# Patient Record
Sex: Male | Born: 1950 | Race: White | Hispanic: No | Marital: Married | Smoking: Never smoker
Health system: Southern US, Community
[De-identification: ages and names within clinical notes are randomized; demographics above are authoritative.]

## PROBLEM LIST (undated history)

## (undated) DIAGNOSIS — N529 Male erectile dysfunction, unspecified: Secondary | ICD-10-CM

## (undated) DIAGNOSIS — I2510 Atherosclerotic heart disease of native coronary artery without angina pectoris: Secondary | ICD-10-CM

## (undated) DIAGNOSIS — Z8669 Personal history of other diseases of the nervous system and sense organs: Secondary | ICD-10-CM

## (undated) DIAGNOSIS — I700 Atherosclerosis of aorta: Secondary | ICD-10-CM

## (undated) DIAGNOSIS — E7800 Pure hypercholesterolemia, unspecified: Secondary | ICD-10-CM

## (undated) DIAGNOSIS — Z7901 Long term (current) use of anticoagulants: Secondary | ICD-10-CM

## (undated) DIAGNOSIS — Z8719 Personal history of other diseases of the digestive system: Secondary | ICD-10-CM

## (undated) DIAGNOSIS — I10 Essential (primary) hypertension: Secondary | ICD-10-CM

## (undated) DIAGNOSIS — I739 Peripheral vascular disease, unspecified: Secondary | ICD-10-CM

## (undated) DIAGNOSIS — K219 Gastro-esophageal reflux disease without esophagitis: Secondary | ICD-10-CM

## (undated) DIAGNOSIS — N400 Enlarged prostate without lower urinary tract symptoms: Secondary | ICD-10-CM

## (undated) DIAGNOSIS — K5790 Diverticulosis of intestine, part unspecified, without perforation or abscess without bleeding: Secondary | ICD-10-CM

## (undated) DIAGNOSIS — E785 Hyperlipidemia, unspecified: Secondary | ICD-10-CM

## (undated) DIAGNOSIS — F419 Anxiety disorder, unspecified: Secondary | ICD-10-CM

## (undated) HISTORY — PX: HERNIA REPAIR: SHX51

## (undated) HISTORY — PX: HEEL SPUR SURGERY: SHX665

## (undated) HISTORY — PX: VASECTOMY: SHX75

---

## 2009-12-25 ENCOUNTER — Emergency Department: Payer: Self-pay | Admitting: Unknown Physician Specialty

## 2011-07-19 ENCOUNTER — Emergency Department: Payer: Self-pay | Admitting: Emergency Medicine

## 2011-07-26 ENCOUNTER — Emergency Department: Payer: Self-pay | Admitting: *Deleted

## 2012-04-23 ENCOUNTER — Encounter: Payer: Self-pay | Admitting: Sports Medicine

## 2012-04-28 ENCOUNTER — Encounter: Payer: Self-pay | Admitting: Sports Medicine

## 2012-05-29 ENCOUNTER — Encounter: Payer: Self-pay | Admitting: Sports Medicine

## 2012-06-02 ENCOUNTER — Ambulatory Visit: Payer: Self-pay | Admitting: Gastroenterology

## 2013-10-05 ENCOUNTER — Ambulatory Visit: Payer: Self-pay | Admitting: Family Medicine

## 2014-09-22 DIAGNOSIS — R001 Bradycardia, unspecified: Secondary | ICD-10-CM | POA: Insufficient documentation

## 2015-03-18 ENCOUNTER — Encounter: Payer: Self-pay | Admitting: Emergency Medicine

## 2015-03-18 ENCOUNTER — Emergency Department
Admission: EM | Admit: 2015-03-18 | Discharge: 2015-03-18 | Disposition: A | Discharge status: Home / Self Care | Payer: 59 | Attending: Emergency Medicine | Admitting: Emergency Medicine

## 2015-03-18 DIAGNOSIS — T63441A Toxic effect of venom of bees, accidental (unintentional), initial encounter: Secondary | ICD-10-CM | POA: Diagnosis present

## 2015-03-18 DIAGNOSIS — Y9289 Other specified places as the place of occurrence of the external cause: Secondary | ICD-10-CM | POA: Diagnosis not present

## 2015-03-18 DIAGNOSIS — Y998 Other external cause status: Secondary | ICD-10-CM | POA: Insufficient documentation

## 2015-03-18 DIAGNOSIS — Y9389 Activity, other specified: Secondary | ICD-10-CM | POA: Diagnosis not present

## 2015-03-18 MED ORDER — PREDNISONE 50 MG PO TABS: 50.0000 mg | ORAL_TABLET | Freq: Every day | ORAL | Status: AC

## 2015-03-18 NOTE — ED Notes (Signed)
Patient with no complaints at this time. Respirations even and unlabored. Skin warm/dry. Discharge instructions reviewed with patient at this time. Patient given opportunity to voice concerns/ask questions. Patient discharged at this time and left Emergency Department with steady gait.   

## 2015-03-18 NOTE — ED Notes (Signed)
Pt reports he was stung on his right hand this afternoon by a bee. Hand now swollen and painful.

## 2015-03-18 NOTE — Discharge Instructions (Signed)

## 2015-03-19 NOTE — ED Provider Notes (Signed)
[facility] Emergency Department Provider Note  ____________________________________________  Time seen: On arrival  I have reviewed the triage vital signs and the nursing notes.   HISTORY  Chief Complaint Insect Bite    HPI Cory Hoover is a 64 y.o. male who presents after a bee sting to the right hand. He reports this occurred approximately 3 PM this afternoon and he developed some swelling of the hand afterwards. He has never had a reaction to a bee sting before. He notes in the last hour it has not worsened. No oral symptoms, no shortness of breath    History reviewed. No pertinent past medical history.  There are no active problems to display for this patient.   History reviewed. No pertinent past surgical history.  Current Outpatient Rx  Name  Route  Sig  Dispense  Refill  . predniSONE (DELTASONE) 50 MG tablet   Oral   Take 1 tablet (50 mg total) by mouth daily. As needed   3 tablet   0     Allergies Review of patient's allergies indicates no known allergies.  No family history on file.  Social History Social History  Substance Use Topics  . Smoking status: Unknown If Ever Smoked  . Smokeless tobacco: None  . Alcohol Use: No    Review of Systems  Constitutional: Negative for fever. Eyes: Negative for visual changes. ENT: Negative for sore throat or swelling   . Musculoskeletal: Negative for back pain. Skin: Negative for rash. Neurological: Negative for headaches    ____________________________________________   PHYSICAL EXAM:  VITAL SIGNS: ED Triage Vitals  Enc Vitals Group     BP 03/18/15 2017 154/86 mmHg     Pulse Rate 03/18/15 2017 63     Resp 03/18/15 2017 18     Temp 03/18/15 2017 98 F (36.7 C)     Temp Source 03/18/15 2017 Oral     SpO2 03/18/15 2017 100 %     Weight 03/18/15 2017 160 lb (72.576 kg)     Height 03/18/15 2017 5\' 6"  (1.676 m)     Head Cir --      Peak Flow --      Pain Score  03/18/15 2018 2     Pain Loc --      Pain Edu? --      Excl. in [place]? --      Constitutional: Alert and oriented. Well appearing and in no distress. Eyes: Conjunctivae are normal.  ENT   Head: Normocephalic and atraumatic.   Mouth/Throat: Mucous membranes are moist. Cardiovascular: Normal rate, regular rhythm.  Respiratory: Normal respiratory effort without tachypnea nor retractions.  Gastrointestinal: Soft and non-tender in all quadrants. No distention. There is no CVA tenderness. Musculoskeletal: Nontender with normal range of motion in all extremities. Neurologic:  Normal speech and language. No gross focal neurologic deficits are appreciated. Skin:  Skin is warm, dry and intact. Patient with mild swelling to the right hand and some slight erythema. It does not spread to the arm. No hives Psychiatric: Mood and affect are normal. Patient exhibits appropriate insight and judgment.  ____________________________________________    LABS (pertinent positives/negatives)  Labs Reviewed - No data to display  ____________________________________________     ____________________________________________    RADIOLOGY I have personally reviewed any xrays that were ordered on this patient: None  ____________________________________________   PROCEDURES  Procedure(s) performed: none   ____________________________________________   INITIAL IMPRESSION / ASSESSMENT AND PLAN / ED COURSE  Pertinent labs &  imaging results that were available during my care of the patient were reviewed by me and considered in my medical decision making (see chart for details).  Exam consistent with localized inflammatory reaction secondary to be envenomation. Recommended supportive care. Prednisone prescription given in case his symptoms worsen  ____________________________________________   FINAL CLINICAL IMPRESSION(S) / ED DIAGNOSES  Final diagnoses:  Bee sting reaction, accidental or  unintentional, initial encounter     Lavonia Drafts, MD 03/19/15 0011

## 2015-05-22 DIAGNOSIS — E7800 Pure hypercholesterolemia, unspecified: Secondary | ICD-10-CM | POA: Insufficient documentation

## 2016-03-07 DIAGNOSIS — Z7185 Encounter for immunization safety counseling: Secondary | ICD-10-CM | POA: Insufficient documentation

## 2017-04-10 DIAGNOSIS — Z0000 Encounter for general adult medical examination without abnormal findings: Secondary | ICD-10-CM | POA: Insufficient documentation

## 2017-04-10 DIAGNOSIS — Z862 Personal history of diseases of the blood and blood-forming organs and certain disorders involving the immune mechanism: Secondary | ICD-10-CM | POA: Insufficient documentation

## 2017-10-16 ENCOUNTER — Other Ambulatory Visit: Payer: Self-pay

## 2017-10-16 ENCOUNTER — Ambulatory Visit
Admission: EM | Admit: 2017-10-16 | Discharge: 2017-10-16 | Disposition: A | Discharge status: Home / Self Care | Payer: Medicare Other | Attending: Family Medicine | Admitting: Family Medicine

## 2017-10-16 ENCOUNTER — Encounter: Payer: Self-pay | Admitting: *Deleted

## 2017-10-16 DIAGNOSIS — H9193 Unspecified hearing loss, bilateral: Secondary | ICD-10-CM

## 2017-10-16 DIAGNOSIS — H6123 Impacted cerumen, bilateral: Secondary | ICD-10-CM | POA: Diagnosis not present

## 2017-10-16 HISTORY — DX: Anxiety disorder, unspecified: F41.9

## 2017-10-16 HISTORY — DX: Gastro-esophageal reflux disease without esophagitis: K21.9

## 2017-10-16 HISTORY — DX: Pure hypercholesterolemia, unspecified: E78.00

## 2017-10-16 HISTORY — DX: Atherosclerotic heart disease of native coronary artery without angina pectoris: I25.10

## 2017-10-16 NOTE — ED Provider Notes (Addendum)
[facility]    CSN: [id number] Arrival date & time: 10/16/17  1344     History   Chief Complaint Chief Complaint  Patient presents with  . Ear Fullness    HPI KOA ZOELLER is a 67 y.o. male.   HPI  67 year old male presents with symptoms of bilateral ear fullness worsened over the past couple of days to the point that he is having difficulty hearing.  Is a history of impacted cerumen.  Remotely he remembers nasal congestion about 3 weeks ago that has since cleared.         Past Medical History:  Diagnosis Date  . Anxiety   . Coronary artery disease   . GERD (gastroesophageal reflux disease)   . High cholesterol     There are no active problems to display for this patient.   Past Surgical History:  Procedure Laterality Date  . HEEL SPUR SURGERY Left   . VASECTOMY         Home Medications    Prior to Admission medications   Medication Sig Start Date End Date Taking? Authorizing Provider  pantoprazole (PROTONIX) 20 MG tablet Take by mouth. 09/02/17  Yes [provider]  Acidophilus Lactobacillus CAPS Take by mouth.    [provider]  aspirin EC 81 MG tablet Take by mouth.    [provider]  atorvastatin (LIPITOR) 40 MG tablet TK 1 AND 1/2 TS PO NIGHTLY 07/18/17   [provider]  doxycycline (VIBRAMYCIN) 100 MG capsule TAKE ONE TABLET TWICE DAILY WITH FOOD AND GLASS OF WATER FOR 10 DAYS 10/13/17   [provider]  tamsulosin (FLOMAX) 0.4 MG CAPS capsule Take 0.4 mg by mouth daily. 09/23/17   [provider]    Family History Family History  Problem Relation Age of Onset  . Heart failure Mother   . Heart failure Father     Social History Social History   Tobacco Use  . Smoking status: Never Smoker  . Smokeless tobacco: Never Used  Substance Use Topics  . Alcohol use: Yes  . Drug use: No     Allergies   Citalopram   Review of Systems Review of Systems  Constitutional:  Negative for activity change, chills, fatigue and fever.  HENT: Positive for ear pain and hearing loss.   All other systems reviewed and are negative.    Physical Exam Triage Vital Signs ED Triage Vitals  Enc Vitals Group     BP 10/16/17 1354 (!) 158/67     Pulse Rate 10/16/17 1354 63     Resp 10/16/17 1354 16     Temp 10/16/17 1354 97.8 F (36.6 C)     Temp Source 10/16/17 1354 Oral     SpO2 10/16/17 1354 100 %     Weight 10/16/17 1355 164 lb (74.4 kg)     Height 10/16/17 1355 '5\' 6"'  (1.676 m)     Head Circumference --      Peak Flow --      Pain Score 10/16/17 1355 0     Pain Loc --      Pain Edu? --      Excl. in [place]? --    No data found.  Updated Vital Signs BP (!) 158/67 (BP Location: Left Arm)   Pulse 63   Temp 97.8 F (36.6 C) (Oral)   Resp 16   Ht '5\' 6"'  (1.676 m)   Wt 164 lb (74.4 kg)   SpO2 100%  BMI 26.47 kg/m   Visual Acuity Right Eye Distance:   Left Eye Distance:   Bilateral Distance:    Right Eye Near:   Left Eye Near:    Bilateral Near:     Physical Exam  Constitutional: He is oriented to person, place, and time. He appears well-developed and well-nourished. No distress.  HENT:  Head: Normocephalic.  Patient has bilateral ear cerumen impaction  Eyes: Pupils are equal, round, and reactive to light.  Neck: Normal range of motion. Neck supple.  Pulmonary/Chest: Effort normal and breath sounds normal.  Musculoskeletal: Normal range of motion.  Neurological: He is alert and oriented to person, place, and time.  Skin: Skin is warm and dry. He is not diaphoretic.  Psychiatric: He has a normal mood and affect. His behavior is normal. Judgment and thought content normal.  Nursing note and vitals reviewed.    UC Treatments / Results  Labs (all labs ordered are listed, but only abnormal results are displayed) Labs Reviewed - No data to display  EKG  EKG Interpretation None       Radiology No results found.  Procedures Procedures  (including critical care time)  Medications Ordered in UC Medications - No data to display   Initial Impression / Assessment and Plan / UC Course  I have reviewed the triage vital signs and the nursing notes.  Pertinent labs & imaging results that were available during my care of the patient were reviewed by me and considered in my medical decision making (see chart for details).     Plan: 1. Test/x-ray results and diagnosis reviewed with patient 2. rx as per orders; risks, benefits, potential side effects reviewed with patient 3. Recommend supportive treatment with use of mineral oil on a weekly basis or Debrox earwax removal kit as we discussed. 4. F/u prn if symptoms worsen or don't improve   Final Clinical Impressions(s) / UC Diagnoses   Final diagnoses:  Bilateral impacted cerumen    ED Discharge Orders    None       Controlled Substance Prescriptions [place] Controlled Substance Registry consulted? Not Applicable   Juanda Crumble 10/16/17 1523  Addendum: Inadvertently ear lavage for bilateral impacked with cerumen was omitted in the above note.  He underwent mechanical and fluid irrigation of both ears removing the cerumen.  Examination following the procedure showed both canals to be clear.  Patient tolerated procedure well.    Lorin Picket, PA-C 10/28/17 [phone]

## 2017-10-16 NOTE — ED Triage Notes (Signed)
Patient started having symptom of bilateral ear fullness after having nasal congestion 3 weeks ago. Patient reports history of ear wax build up.

## 2019-08-31 ENCOUNTER — Other Ambulatory Visit: Payer: Self-pay | Admitting: Family Medicine

## 2019-08-31 DIAGNOSIS — N5082 Scrotal pain: Secondary | ICD-10-CM

## 2019-08-31 DIAGNOSIS — E7800 Pure hypercholesterolemia, unspecified: Secondary | ICD-10-CM

## 2019-09-02 ENCOUNTER — Encounter: Payer: Self-pay | Admitting: Emergency Medicine

## 2019-09-02 ENCOUNTER — Encounter (INDEPENDENT_AMBULATORY_CARE_PROVIDER_SITE_OTHER): Payer: Self-pay

## 2019-09-02 ENCOUNTER — Other Ambulatory Visit: Payer: Self-pay

## 2019-09-02 ENCOUNTER — Ambulatory Visit
Admission: RE | Admit: 2019-09-02 | Discharge: 2019-09-02 | Disposition: A | Discharge status: Home / Self Care | Payer: Medicare Other | Source: Ambulatory Visit | Attending: Family Medicine | Admitting: Family Medicine

## 2019-09-02 ENCOUNTER — Ambulatory Visit
Admission: EM | Admit: 2019-09-02 | Discharge: 2019-09-02 | Disposition: A | Discharge status: Home / Self Care | Payer: Medicare Other | Attending: Urgent Care | Admitting: Urgent Care

## 2019-09-02 DIAGNOSIS — E7800 Pure hypercholesterolemia, unspecified: Secondary | ICD-10-CM | POA: Diagnosis present

## 2019-09-02 DIAGNOSIS — H6123 Impacted cerumen, bilateral: Secondary | ICD-10-CM | POA: Diagnosis not present

## 2019-09-02 DIAGNOSIS — N5082 Scrotal pain: Secondary | ICD-10-CM | POA: Insufficient documentation

## 2019-09-02 DIAGNOSIS — H60503 Unspecified acute noninfective otitis externa, bilateral: Secondary | ICD-10-CM

## 2019-09-02 HISTORY — DX: Personal history of other diseases of the nervous system and sense organs: Z86.69

## 2019-09-02 MED ORDER — NEOMYCIN-POLYMYXIN-HC 3.5-10000-1 OT SUSP: 3.0000 [drp] | Freq: Three times a day (TID) | OTIC | 0 refills | Status: DC

## 2019-09-02 MED ORDER — DOCUSATE SODIUM 50 MG/5ML PO LIQD
50.0000 mg | Freq: Once | ORAL | Status: AC
  Administered 2019-09-02: 50 mg via OTIC

## 2019-09-02 NOTE — ED Provider Notes (Signed)
[place] [place]   Name: Cory Hoover DOB: Jun 04, 1951 MRN: [id number] CSN: [id number] PCP: Dion Body, MD  Arrival date and time:  09/02/19 1142  Chief Complaint:  Cerumen Impaction   NOTE: Prior to seeing the patient today, I have reviewed the triage nursing documentation and vital signs. Clinical staff has updated patient's PMH/PSHx, current medication list, and drug allergies/intolerances to ensure comprehensive history available to assist in medical decision making.   History:   HPI: Cory Hoover is a 69 y.o. male who presents today with complaints of his BILATERAL ears being clogged. This is a recurring issue for this patient; has been seen here in the past for the same. Patient uses mineral oil and Debrox as preventative measures, however he notes that his ears still frequently get clogged up. He denies any associated fevers. Patient has not had any other recent upper respiratory symptoms; no cough, congestion, rhinorrhea, sneezing, or sore throat. He denies forceful nose blowing. Patient has not appreciated any otorrhea. He advises that his ability to hear from his ears has been more muffled (R>L) as of late. Patient denies history of recurrent ear infections. He has never had tympanostomy tubes in the past. Patient denies the use of cotton tip swabs to clean his ears. He does use headphones a lot. Of note, patient was seen by his PCP this week and he recommended an ENT referral or visit to a walk in clinic for his ears.    Past Medical History:  Diagnosis Date  . Anxiety   . Coronary artery disease   . GERD (gastroesophageal reflux disease)   . High cholesterol   . History of impacted cerumen     Past Surgical History:  Procedure Laterality Date  . HEEL SPUR SURGERY Left   . VASECTOMY      Family History  Problem Relation Age of Onset  . Heart failure Mother   . Heart failure Father     Social History   Tobacco Use  . Smoking status: Never Smoker  .  Smokeless tobacco: Never Used  Substance Use Topics  . Alcohol use: Yes  . Drug use: No    There are no problems to display for this patient.   Home Medications:    Current Meds  Medication Sig  . Acidophilus Lactobacillus CAPS Take by mouth.  Marland Kitchen aspirin EC 81 MG tablet Take by mouth.  Marland Kitchen atorvastatin (LIPITOR) 40 MG tablet TK 1 AND 1/2 TS PO NIGHTLY  . pantoprazole (PROTONIX) 20 MG tablet Take by mouth.  . tamsulosin (FLOMAX) 0.4 MG CAPS capsule Take 0.4 mg by mouth daily.  . [DISCONTINUED] doxycycline (VIBRAMYCIN) 100 MG capsule TAKE ONE TABLET TWICE DAILY WITH FOOD AND GLASS OF WATER FOR 10 DAYS    Allergies:   Citalopram  Review of Systems (ROS): Review of Systems  Constitutional: Negative for fatigue and fever.  HENT: Negative for congestion, ear pain, postnasal drip, rhinorrhea, sinus pressure, sinus pain, sneezing and sore throat.        (+) ears clogged and muffled hearing  Eyes: Negative for pain, discharge and redness.  Respiratory: Negative for cough and shortness of breath.   Cardiovascular: Negative for chest pain and palpitations.  Gastrointestinal: Negative for diarrhea, nausea and vomiting.  Musculoskeletal: Negative for arthralgias, back pain, myalgias and neck pain.  Skin: Negative for color change, pallor and rash.  Neurological: Negative for dizziness and headaches.     Vital Signs: Today's Vitals   09/02/19 1204 09/02/19 1205  BP:  (!) 134/93  Pulse:  (!) 56  Resp:  18  Temp:  98.2 F (36.8 C)  TempSrc:  Oral  SpO2:  99%  Weight: 160 lb (72.6 kg)   Height: 5\' 6"  (1.676 m)   PainSc: 0-No pain     Physical Exam: Physical Exam  Constitutional: He is oriented to person, place, and time and well-developed, well-nourished, and in no distress.  HENT:  Head: Normocephalic and atraumatic.  Nose: Nose normal.  Mouth/Throat: Uvula is midline, oropharynx is clear and moist and mucous membranes are normal.  BILATERAL ears with EACs totally obscured by  cerumen. Unable to visualize TM (pre-procedural).    Eyes: Pupils are equal, round, and reactive to light.  Cardiovascular: Normal rate and intact distal pulses.  Pulmonary/Chest: Effort normal. No respiratory distress.  Neurological: He is alert and oriented to person, place, and time. Gait normal.  Skin: Skin is warm and dry. No rash noted. He is not diaphoretic.  Psychiatric: Mood, memory, affect and judgment normal.  Nursing note and vitals reviewed.   Urgent Care Treatments / Results:   Orders Placed This Encounter  Procedures  . Ear wax removal    LABS: PLEASE NOTE: all labs that were ordered this encounter are listed, however only abnormal results are displayed. Labs Reviewed - No data to display  EKG: -None  RADIOLOGY: No results found.  PROCEDURES: Ear Cerumen Removal Performed by: Karen Kitchens, NP Authorized by: Karen Kitchens, NP   Consent:    Consent obtained:  Verbal   Consent given by:  Patient   Risks discussed:  Bleeding, dizziness, infection, incomplete removal, pain and TM perforation   Alternatives discussed:  Alternative treatment and referral Procedure details:    Location:  L ear and R ear   Procedure type comment:  Docusate dwell followed by warm water lavage and currette disimpaction Post-procedure details:    Inspection:  Bleeding, macerated skin and TM intact   Hearing quality:  Improved   Patient tolerance of procedure:  Tolerated well, no immediate complications    MEDICATIONS RECEIVED THIS VISIT: Medications  docusate (COLACE) 50 MG/5ML liquid 50 mg (has no administration in time range)    PERTINENT CLINICAL COURSE NOTES/UPDATES:   Initial Impression / Assessment and Plan / Urgent Care Course:  Pertinent labs & imaging results that were available during my care of the patient were personally reviewed by me and considered in my medical decision making (see lab/imaging section of note for values and interpretations).  Cory Hoover is a 69 y.o. male who presents to [facility] today with complaints of Cerumen Impaction  Patient is well appearing overall in clinic today. He does not appear to be in any acute distress. Presenting symptoms (see HPI) and exam as documented above. Presents with recurrent cerumen impactions to BILATERAL ears. Disimpactions performed as per above procedure note. Post-procedural exam with open, bleeding, and macerated skin. Patient complains of pain. Will treat with Cortisporin otic gtts TID x 5 days. May use Tylenol and/or Ibuprofen as needed for pain. Patient advised to avoid cotton tipped swabs. Discussed increasing frequency in which he is currently using the Debrox; only using q3 months at this point.   Discussed follow up with primary care physician in 1 week for re-evaluation. I have reviewed the follow up and strict return precautions for any new or worsening symptoms. Patient is aware of symptoms that would be deemed urgent/emergent, and would thus require further evaluation either here or  in the emergency department. At the time of discharge, he verbalized understanding and consent with the discharge plan as it was reviewed with him. All questions were fielded by provider and/or clinic staff prior to patient discharge.    Final Clinical Impressions / Urgent Care Diagnoses:   Final diagnoses:  Bilateral impacted cerumen  Acute otitis externa of both ears, unspecified type    New Prescriptions:  [place] Controlled Substance Registry consulted? Not Applicable  Meds ordered this encounter  Medications  . docusate (COLACE) 50 MG/5ML liquid 50 mg  . neomycin-polymyxin-hydrocortisone (CORTISPORIN) 3.5-10000-1 OTIC suspension    Sig: Place 3 drops into both ears 3 (three) times daily. X 5 days    Dispense:  10 mL    Refill:  0    Recommended Follow up Care:  Patient encouraged to follow up with the following provider within the specified time frame, or sooner as dictated by the  severity of his symptoms. As always, he was instructed that for any urgent/emergent care needs, he should seek care either here or in the emergency department for more immediate evaluation.  Follow-up Information    Dion Body, MD.   Specialty: Family Medicine Why: As needed Contact information: [address] [place] [id number] [phone]         NOTE: This note was prepared using Dragon dictation software along with smaller phrase technology. Despite my best ability to proofread, there is the potential that transcriptional errors may still occur from this process, and are completely unintentional.    Karen Kitchens, NP 09/02/19 2300

## 2019-09-02 NOTE — ED Notes (Signed)
Ceruminosis is noted.  Wax is removed bilaterally by syringing and manual debridement. Patient tolerated procedure well.

## 2019-09-02 NOTE — Discharge Instructions (Addendum)
It was very nice seeing you today in clinic. Thank you for entrusting me with your care.   Use ear drops as prescribed. There is maceration and irritation there that is susceptible to infection and pain. These drops should help.   Avoid cotton tipped swabs or putting into the ear that would cause the wax to become compacted/impacted. Headphones may be contributing to you having this recurrent use. Continue Debrox, however use more frequently. Also, look for the   Make arrangements to follow up with your regular doctor in 1 week for re-evaluation if not improving. If your symptoms/condition worsens, please seek follow up care either here or in the ER. Please remember, our [facility] providers are "right here with you" when you need [place].   Again, it was my pleasure to take care of you today. Thank you for choosing our clinic. I hope that you start to feel better quickly.   Honor Loh, MSN, APRN, FNP-C, CEN Advanced Practice Provider [facility]

## 2019-09-02 NOTE — ED Triage Notes (Signed)
Patient here for ear wax removal. Per his PCP yesterday they do not remove ear wax and they would need to refer him to ENT but patient decided to come here. Patient states his right ear is giving him more problems than the left. He is using OTC mineral oil 4-5 drops once per month and Debrox every 3 months.

## 2019-10-19 ENCOUNTER — Encounter: Payer: Self-pay | Admitting: Urology

## 2019-10-19 ENCOUNTER — Other Ambulatory Visit: Payer: Self-pay

## 2019-10-19 ENCOUNTER — Ambulatory Visit (INDEPENDENT_AMBULATORY_CARE_PROVIDER_SITE_OTHER): Payer: Medicare Other | Admitting: Urology

## 2019-10-19 VITALS — BP 124/80 | HR 72 | Ht 67.0 in | Wt 160.0 lb

## 2019-10-19 DIAGNOSIS — N3281 Overactive bladder: Secondary | ICD-10-CM | POA: Diagnosis not present

## 2019-10-19 DIAGNOSIS — N138 Other obstructive and reflux uropathy: Secondary | ICD-10-CM

## 2019-10-19 DIAGNOSIS — N5082 Scrotal pain: Secondary | ICD-10-CM | POA: Diagnosis not present

## 2019-10-19 DIAGNOSIS — K2100 Gastro-esophageal reflux disease with esophagitis, without bleeding: Secondary | ICD-10-CM | POA: Insufficient documentation

## 2019-10-19 DIAGNOSIS — N401 Enlarged prostate with lower urinary tract symptoms: Secondary | ICD-10-CM

## 2019-10-19 DIAGNOSIS — R351 Nocturia: Secondary | ICD-10-CM | POA: Insufficient documentation

## 2019-10-19 DIAGNOSIS — I2510 Atherosclerotic heart disease of native coronary artery without angina pectoris: Secondary | ICD-10-CM | POA: Insufficient documentation

## 2019-10-19 NOTE — Patient Instructions (Addendum)
Pelvic Pain, Male  Pelvic pain is pain in your lower abdomen, below your belly button and between your hips. The pain may start suddenly (be acute), keep coming back (recur), or last a long time (become chronic). Pelvic pain that lasts longer than six months is considered chronic. There are many possible causes of pelvic pain. Sometimes, the cause is not known.  Pelvic pain may affect your:  · Prostate gland.  · Urinary system.  · Digestive tract.  · Musculoskeletal system. Strained muscles or ligaments may cause pelvic pain.  Follow these instructions at home:    Medicines  · Take over-the-counter and prescription medicines only as told by your health care provider.  · If you were prescribed an antibiotic medicine, take it as told by your health care provider. Do not stop taking the antibiotic even if you start to feel better.  Managing pain, stiffness, and swelling    · Take warm water baths (sitz baths). Sitz baths help with relaxing your pelvic floor muscles.  ? For a sitz bath, the water only comes up to your hips and covers your buttocks. A sitz bath may done at home in a bathtub or with a portable sitz bath that fits over the toilet.  · If directed, apply heat to the affected area before you exercise. Use the heat source that your health care provider recommends, such as a moist heat pack or a heating pad.  ? Place a towel between your skin and the heat source.  ? Leave the heat on for 20-30 minutes.  ? Remove the heat if your skin turns bright red. This is especially important if you are unable to feel pain, heat, or cold. You may have a greater risk of getting burned.  General instructions  · Rest as told by your health care provider.  · Keep a journal of your pelvic pain. Write down:  ? When the pain started.  ? Where the pain is located.  ? What seems to make the pain better or worse.  ? Any symptoms you have along with the pain.  · Follow your treatment plan as told by your health care provider. This may  include:  ? Pelvic physical therapy.  ? Yoga, meditation, and exercise.  ? Biofeedback. This process trains you to manage your body's response (physiological response) through breathing techniques and relaxation methods. You will work with a therapist while machines are used to monitor your physical symptoms.  ? Acupuncture. This is a type of treatment that involves stimulating specific points on your body by inserting thin needles through your skin to treat pain.  · Keep all follow-up visits as told by your health care provider. This is important.  Contact a health care provider if:  · Medicine does not help your pain.  · Your pain comes back.  · You have new symptoms.  · You have a fever or chills.  · You are constipated.  · You have blood in your urine or stool.  · You feel weak or light-headed.  Get help right away if:  · You have sudden severe pain.  · Your pain steadily gets worse.  · You have severe pain along with fever, nausea, vomiting, or excessive sweating.  Summary  · Pelvic pain is pain in your lower abdomen, below your belly button and between your hips. There are many possible causes of pelvic pain. Sometimes, the cause is not known.  · Take over-the-counter and prescription medicines only as told   Get help right away if you have severe pain along with fever, nausea, vomiting, or excessive sweating.  Keep all follow-up visits as told by your health care provider. This is important. This information is not intended to replace advice given to you by your health care provider. Make sure you discuss any questions you have with your health care provider. Document Revised: 12/03/2017 Document Reviewed: 12/03/2017 Elsevier Patient Education   [place].   Benign Prostatic Hyperplasia  Benign prostatic hyperplasia (BPH) is an enlarged prostate gland that is caused by the normal aging process and not by cancer. The prostate is a walnut-sized gland that is involved in the production of semen. It is located in front of the rectum and below the bladder. The bladder stores urine and the urethra is the tube that carries the urine out of the body. The prostate may get bigger as a man gets older. An enlarged prostate can press on the urethra. This can make it harder to pass urine. The build-up of urine in the bladder can cause infection. Back pressure and infection may progress to bladder damage and kidney (renal) failure. What are the causes? This condition is part of a normal aging process. However, not all men develop problems from this condition. If the prostate enlarges away from the urethra, urine flow will not be blocked. If it enlarges toward the urethra and compresses it, there will be problems passing urine. What increases the risk? This condition is more likely to develop in men over the age of 94 years. What are the signs or symptoms? Symptoms of this condition include:  Getting up often during the night to urinate.  Needing to urinate frequently during the day.  Difficulty starting urine flow.  Decrease in size and strength of your urine stream.  Leaking (dribbling) after urinating.  Inability to pass urine. This needs immediate treatment.  Inability to completely empty your bladder.  Pain when you pass urine. This is more common if there is also an infection.  Urinary tract infection (UTI). How is this diagnosed? This condition is diagnosed based on your medical history, a physical exam, and your symptoms. Tests will also be done, such as:  A post-void bladder scan. This measures any amount of urine that may remain in your bladder after you finish urinating.  A digital rectal exam. In a rectal exam, your  health care provider checks your prostate by putting a lubricated, gloved finger into your rectum to feel the back of your prostate gland. This exam detects the size of your gland and any abnormal lumps or growths.  An exam of your urine (urinalysis).  A prostate specific antigen (PSA) screening. This is a blood test used to screen for prostate cancer.  An ultrasound. This test uses sound waves to electronically produce a picture of your prostate gland. Your health care provider may refer you to a specialist in kidney and prostate diseases (urologist). How is this treated? Once symptoms begin, your health care provider will monitor your condition (active surveillance or watchful waiting). Treatment for this condition will depend on the severity of your condition. Treatment may include:  Observation and yearly exams. This may be the only treatment needed if your condition and symptoms are mild.  Medicines to relieve your symptoms, including: ? Medicines to shrink the prostate. ? Medicines to relax the muscle of the prostate.  Surgery in severe cases. Surgery may include: ? Prostatectomy. In this procedure, the prostate tissue is removed completely through an  open incision or with a laparoscope or robotics. ? Transurethral resection of the prostate (TURP). In this procedure, a tool is inserted through the opening at the tip of the penis (urethra). It is used to cut away tissue of the inner core of the prostate. The pieces are removed through the same opening of the penis. This removes the blockage. ? Transurethral incision (TUIP). In this procedure, small cuts are made in the prostate. This lessens the prostate's pressure on the urethra. ? Transurethral microwave thermotherapy (TUMT). This procedure uses microwaves to create heat. The heat destroys and removes a small amount of prostate tissue. ? Transurethral needle ablation (TUNA). This procedure uses radio frequencies to destroy and remove a  small amount of prostate tissue. ? Interstitial laser coagulation ([place]). This procedure uses a laser to destroy and remove a small amount of prostate tissue. ? Transurethral electrovaporization (TUVP). This procedure uses electrodes to destroy and remove a small amount of prostate tissue. ? Prostatic urethral lift. This procedure inserts an implant to push the lobes of the prostate away from the urethra. Follow these instructions at home:  Take over-the-counter and prescription medicines only as told by your health care provider.  Monitor your symptoms for any changes. Contact your health care provider with any changes.  Avoid drinking large amounts of liquid before going to bed or out in public.  Avoid or reduce how much caffeine or alcohol you drink.  Give yourself time when you urinate.  Keep all follow-up visits as told by your health care provider. This is important. Contact a health care provider if:  You have unexplained back pain.  Your symptoms do not get better with treatment.  You develop side effects from the medicine you are taking.  Your urine becomes very dark or has a bad smell.  Your lower abdomen becomes distended and you have trouble passing your urine. Get help right away if:  You have a fever or chills.  You suddenly cannot urinate.  You feel lightheaded, or very dizzy, or you faint.  There are large amounts of blood or clots in the urine.  Your urinary problems become hard to manage.  You develop moderate to severe low back or flank pain. The flank is the side of your body between the ribs and the hip. These symptoms may represent a serious problem that is an emergency. Do not wait to see if the symptoms will go away. Get medical help right away. Call your local emergency services (911 in the [place] Do not drive yourself to the hospital. Summary  Benign prostatic hyperplasia (BPH) is an enlarged prostate that is caused by the normal aging process and  not by cancer.  An enlarged prostate can press on the urethra. This can make it hard to pass urine.  This condition is part of a normal aging process and is more likely to develop in men over the age of 53 years.  Get help right away if you suddenly cannot urinate. This information is not intended to replace advice given to you by your health care provider. Make sure you discuss any questions you have with your health care provider. Document Revised: 06/09/2018 Document Reviewed: 08/19/2016 Elsevier Patient Education  2020 [employer].   Prostate Cancer Screening  Prostate cancer screening is a test that is done to check for the presence of prostate cancer in men. The prostate gland is a walnut-sized gland that is located below the bladder and in front of the rectum in males.  The function of the prostate is to add fluid to semen during ejaculation. Prostate cancer is the second most common type of cancer in men. Who should have prostate cancer screening?  Screening recommendations vary based on age and other risk factors. Screening is recommended if: You are older than age 41. If you are age 13-69, talk with your health care provider about your need for screening and how often screening should be done. Because most prostate cancers are slow growing and will not cause death, screening is generally reserved in this age group for men who have a 10-15-year life expectancy. You are younger than age 43, and you have these risk factors: Being a black male or a male of African descent. Having a father, brother, or uncle who has been diagnosed with prostate cancer. The risk is higher if your family member's cancer occurred at an early age. Screening is not recommended if: You are younger than age 19. You are between the ages of 38 and 56 and you have no risk factors. You are 23 years of age or older. At this age, the risks that screening can cause are greater than the benefits that it may  provide. If you are at high risk for prostate cancer, your health care provider may recommend that you have screenings more often or that you start screening at a younger age. How is screening for prostate cancer done? The recommended prostate cancer screening test is a blood test called the prostate-specific antigen (PSA) test. PSA is a protein that is made in the prostate. As you age, your prostate naturally produces more PSA. Abnormally high PSA levels may be caused by: Prostate cancer. An enlarged prostate that is not caused by cancer (benign prostatic hyperplasia, BPH). This condition is very common in older men. A prostate gland infection (prostatitis). Depending on the PSA results, you may need more tests, such as: A physical exam to check the size of your prostate gland. Blood and imaging tests. A procedure to remove tissue samples from your prostate gland for testing (biopsy). What are the benefits of prostate cancer screening? Screening can help to identify cancer at an early stage, before symptoms start and when the cancer can be treated more easily. There is a small chance that screening may lower your risk of dying from prostate cancer. The chance is small because prostate cancer is a slow-growing cancer, and most men with prostate cancer die from a different cause. What are the risks of prostate cancer screening? The main risk of prostate cancer screening is diagnosing and treating prostate cancer that would never have caused any symptoms or problems. This is called overdiagnosisand overtreatment. PSA screening cannot tell you if your PSA is high due to cancer or a different cause. A prostate biopsy is the only procedure to diagnose prostate cancer. Even the results of a biopsy may not tell you if your cancer needs to be treated. Slow-growing prostate cancer may not need any treatment other than monitoring, so diagnosing and treating it may cause unnecessary stress or other side  effects. A prostate biopsy may also cause: Infection or fever. A false negative. This is a result that shows that you do not have prostate cancer when you actually do have prostate cancer. Questions to ask your health care provider When should I start prostate cancer screening? What is my risk for prostate cancer? How often do I need screening? What type of screening tests do I need? How do I get my test results?  What do my results mean? Do I need treatment? Where to find more information The American Cancer Society: www.cancer.org American Urological Association: www.auanet.org Contact a health care provider if: You have difficulty urinating. You have pain when you urinate or ejaculate. You have blood in your urine or semen. You have pain in your back or in the area of your prostate. Summary Prostate cancer is a common type of cancer in men. The prostate gland is located below the bladder and in front of the rectum. This gland adds fluid to semen during ejaculation. Prostate cancer screening may identify cancer at an early stage, when the cancer can be treated more easily. The prostate-specific antigen (PSA) test is the recommended screening test for prostate cancer. Discuss the risks and benefits of prostate cancer screening with your health care provider. If you are age 30 or older, the risks that screening can cause are greater than the benefits that it may provide. This information is not intended to replace advice given to you by your health care provider. Make sure you discuss any questions you have with your health care provider. Document Revised: 02/25/2019 Document Reviewed: 02/25/2019 Elsevier Patient Education  [place].  Overactive Bladder, Adult  Overactive bladder refers to a condition in which a person has a sudden need to pass urine. The person may leak urine if he or she cannot get to the bathroom fast enough (urinary incontinence). A person with this  condition may also wake up several times in the night to go to the bathroom. Overactive bladder is associated with poor nerve signals between your bladder and your brain. Your bladder may get the signal to empty before it is full. You may also have very sensitive muscles that make your bladder squeeze too soon. These symptoms might interfere with daily work or social activities. What are the causes? This condition may be associated with or caused by:  Urinary tract infection.  Infection of nearby tissues, such as the prostate.  Prostate enlargement.  Surgery on the uterus or urethra.  Bladder stones, inflammation, or tumors.  Drinking too much caffeine or alcohol.  Certain medicines, especially medicines that get rid of extra fluid in the body (diuretics).  Muscle or nerve weakness, especially from: ? A spinal cord injury. ? Stroke. ? Multiple sclerosis. ? Parkinson's disease.  Diabetes.  Constipation. What increases the risk? You may be at greater risk for overactive bladder if you:  Are an older adult.  Smoke.  Are going through menopause.  Have prostate problems.  Have a neurological disease, such as stroke, dementia, Parkinson's disease, or multiple sclerosis (MS).  Eat or drink things that irritate the bladder. These include alcohol, spicy food, and caffeine.  Are overweight or obese. What are the signs or symptoms? Symptoms of this condition include:  Sudden, strong urge to urinate.  Leaking urine.  Urinating 8 or more times a day.  Waking up to urinate 2 or more times a night. How is this diagnosed? Your health care provider may suspect overactive bladder based on your symptoms. He or she will diagnose this condition by:  A physical exam and medical history.  Blood or urine tests. You might need bladder or urine tests to help determine what is causing your overactive bladder. You might also need to see a health care provider who specializes in  urinary tract problems (urologist). How is this treated? Treatment for overactive bladder depends on the cause of your condition and whether it is mild or severe.  You can also make lifestyle changes at home. Options include:  Bladder training. This may include: ? Learning to control the urge to urinate by following a schedule that directs you to urinate at regular intervals (timed voiding). ? Doing Kegel exercises to strengthen your pelvic floor muscles, which support your bladder. Toning these muscles can help you control urination, even if your bladder muscles are overactive.  Special devices. This may include: ? Biofeedback, which uses sensors to help you become aware of your body's signals. ? Electrical stimulation, which uses electrodes placed inside the body (implanted) or outside the body. These electrodes send gentle pulses of electricity to strengthen the nerves or muscles that control the bladder. ? Women may use a plastic device that fits into the vagina and supports the bladder (pessary).  Medicines. ? Antibiotics to treat bladder infection. ? Antispasmodics to stop the bladder from releasing urine at the wrong time. ? Tricyclic antidepressants to relax bladder muscles. ? Injections of botulinum toxin type A directly into the bladder tissue to relax bladder muscles.  Lifestyle changes. This may include: ? Weight loss. Talk to your health care provider about weight loss methods that would work best for you. ? Diet changes. This may include reducing how much alcohol and caffeine you consume, or drinking fluids at different times of the day. ? Not smoking. Do not use any products that contain nicotine or tobacco, such as cigarettes and e-cigarettes. If you need help quitting, ask your health care provider.  Surgery. ? A device may be implanted to help manage the nerve signals that control urination. ? An electrode may be implanted to stimulate electrical signals in the  bladder. ? A procedure may be done to change the shape of the bladder. This is done only in very severe cases. Follow these instructions at home: Lifestyle  Make any diet or lifestyle changes that are recommended by your health care provider. These may include: ? Drinking less fluid or drinking fluids at different times of the day. ? Cutting down on caffeine or alcohol. ? Doing Kegel exercises. ? Losing weight if needed. ? Eating a healthy and balanced diet to prevent constipation. This may include:  Eating foods that are high in fiber, such as fresh fruits and vegetables, whole grains, and beans.  Limiting foods that are high in fat and processed sugars, such as fried and sweet foods. General instructions  Take over-the-counter and prescription medicines only as told by your health care provider.  If you were prescribed an antibiotic medicine, take it as told by your health care provider. Do not stop taking the antibiotic even if you start to feel better.  Use any implants or pessary as told by your health care provider.  If needed, wear pads to absorb urine leakage.  Keep a journal or log to track how much and when you drink and when you feel the need to urinate. This will help your health care provider monitor your condition.  Keep all follow-up visits as told by your health care provider. This is important. Contact a health care provider if:  You have a fever.  Your symptoms do not get better with treatment.  Your pain and discomfort get worse.  You have more frequent urges to urinate. Get help right away if:  You are not able to control your bladder. Summary  Overactive bladder refers to a condition in which a person has a sudden need to pass urine.  Several conditions may lead to an overactive  bladder.  Treatment for overactive bladder depends on the cause and severity of your condition.  Follow your health care provider's instructions about lifestyle changes,  doing Kegel exercises, keeping a journal, and taking medicines. This information is not intended to replace advice given to you by your health care provider. Make sure you discuss any questions you have with your health care provider. Document Revised: 11/05/2018 Document Reviewed: 07/31/2017 Elsevier Patient Education  [place].

## 2019-10-19 NOTE — Progress Notes (Signed)
10/19/19 1:54 PM   Cory Hoover May 25, 1951 [id number]  CC: Left scrotal pain, BPH/OAB  HPI: I saw Cory Hoover in urology clinic today for evaluation of left-sided scrotal pain and urinary symptoms.  He is a 69 year old healthy male with past medical history notable for anxiety and CAD who presents with left-sided scrotal pain.  This has been chronic since he had a vasectomy over 40 years ago, and he feels like his symptoms flare intermittently for a few months at a time.  He feels like his pain is worse when he is physically active or in a certain seated position, there are no alleviating factors.  He reports that the pain sometimes will radiate to his left hip.  He reports his vasectomy was very traumatic and painful, and he does not think he had any scrotal pain prior to undergoing this procedure.  Scrotal ultrasounds in 2015 and February 2021 were benign.  He is on Flomax 0.8 mg nightly for urinary symptoms of primarily frequency.  He reports the Flomax decrease his urinary frequency from 13 times a day to 9 times a day.  He has nocturia 1-2 times overnight.  He denies any history of retention or UTIs.  Using shared decision-making, he elected to stop yearly PSA screening with his PCP a few years ago, and his last PSA was well within the normal range at 0.7.  He does drink 2 cups of coffee and soda during the day.   PMH: Past Medical History:  Diagnosis Date  . Anxiety   . Coronary artery disease   . GERD (gastroesophageal reflux disease)   . High cholesterol   . History of impacted cerumen     Surgical History: Past Surgical History:  Procedure Laterality Date  . HEEL SPUR SURGERY Left   . VASECTOMY       Family History: Family History  Problem Relation Age of Onset  . Heart failure Mother   . Heart failure Father     Social History:  reports that he has never smoked. He has never used smokeless tobacco. He reports current alcohol use. He reports that he does not use  drugs.  Physical Exam: BP 124/80 (BP Location: Left Arm, Patient Position: Sitting, Cuff Size: Normal)   Pulse 72   Ht 5\' 7"  (1.702 m)   Wt 160 lb (72.6 kg)   BMI 25.06 kg/m    Constitutional:  Alert and oriented, No acute distress. Cardiovascular: No clubbing, cyanosis, or edema. Respiratory: Normal respiratory effort, no increased work of breathing. GI: Abdomen is soft, nontender, nondistended, no abdominal masses GU: Widely patient meatus, testicles 10cc and descended bilaterally, no masses, non-tender to palpation Lymph: No cervical or inguinal lymphadenopathy. Skin: No rashes, bruises or suspicious lesions. Neurologic: Grossly intact, no focal deficits, moving all 4 extremities. Psychiatric: Normal mood and affect.  Assessment & Plan:   In summary, he is a 69 year old relatively healthy male with intermittent left-sided scrotal pain that radiates to the left hip, as well as mild urinary symptoms of urinary frequency that are improved significantly with Flomax.  We had a long conversation about possible etiologies of scrotal pain and his benign scrotal ultrasound.  We discussed common treatment strategies including NSAIDs, pelvic floor physical therapy, and sometimes need for additional medications like amitriptyline or gabapentin, and even procedures like spermatic cord denervation if refractory symptoms.  Regarding his urinary symptoms we discussed the overlap between BPH and OAB.  We discussed the AUA guidelines regarding PSA screening.  I  recommended starting with behavioral strategies of minimizing coffee and soda in the diet for his urinary frequency, but would recommend a PVR at follow-up and consideration of an OAB medication in the future if he continues to have worsening urinary frequency.  Trial of NSAIDs x2 weeks for scrotal pain Continue Flomax for BPH symptoms Referral placed to pelvic floor physical therapy RTC 2 months virtual visit symptom check  Nickolas Madrid,  MD 10/19/2019  [facility] [address] [place], [place] [address] [phone]

## 2019-12-08 DIAGNOSIS — R9431 Abnormal electrocardiogram [ECG] [EKG]: Secondary | ICD-10-CM | POA: Insufficient documentation

## 2019-12-21 ENCOUNTER — Telehealth: Payer: Medicare Other | Admitting: Urology

## 2019-12-23 ENCOUNTER — Ambulatory Visit: Payer: Medicare Other | Attending: Urology | Admitting: Physical Therapy

## 2019-12-23 ENCOUNTER — Other Ambulatory Visit: Payer: Self-pay

## 2019-12-23 DIAGNOSIS — M4125 Other idiopathic scoliosis, thoracolumbar region: Secondary | ICD-10-CM | POA: Insufficient documentation

## 2019-12-23 DIAGNOSIS — M2170 Unequal limb length (acquired), unspecified site: Secondary | ICD-10-CM | POA: Insufficient documentation

## 2019-12-23 DIAGNOSIS — R2689 Other abnormalities of gait and mobility: Secondary | ICD-10-CM | POA: Insufficient documentation

## 2019-12-23 DIAGNOSIS — R293 Abnormal posture: Secondary | ICD-10-CM | POA: Diagnosis present

## 2019-12-23 NOTE — Therapy (Signed)
[place] MAIN [facility] SERVICES [address] [place], [place], [address] Phone: [phone]   Fax:  [phone]  Physical Therapy Evaluation  Patient Details  Name: Cory Hoover MRN: [id number] Date of Birth: [birth year] Referring Provider (PT): Sninisky    Encounter Date: 12/23/2019  PT End of Session - 12/23/19 1558    Visit Number  1    Number of Visits  10    PT Start Time  0800    PT Stop Time  0900    PT Time Calculation (min)  60 min    Activity Tolerance  Patient tolerated treatment well    Behavior During Therapy  [facility] for tasks assessed/performed       Past Medical History:  Diagnosis Date  . Anxiety   . Coronary artery disease   . GERD (gastroesophageal reflux disease)   . High cholesterol   . History of impacted cerumen     Past Surgical History:  Procedure Laterality Date  . HEEL SPUR SURGERY Left   . VASECTOMY      There were no vitals filed for this visit.   Subjective Assessment - 12/23/19 0814    Subjective  1) L scrotal pain started after vastectomy in this 27s in 1982.  Pain started at 5-6/ 10 which radiated to lower L hip and low abdomen.  It subsided to 0/10 for years. 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  This pain reoccured 6 times for the past 38 years at level 5-8/10 . It lasts for several months. Easing factors: tylenol and advil lowers to 3-4/10.  Aggravating factors: tight briefs , loose briefs. sensitive to touch.  Pt sits at a computer 10 hours. Pt plays tennis 2-3 x week, for 2-2.5 hours each time stretches before playing but after. Weight machines 2 x week, aerobic" rowing, teadmill, stationery bike,          2) Incomplete emptying with frequency and dribbling post-void in the early 1990s in his 41s. Hx of benign hyperplasia. Pt avoids drinking water/ eating 2 hours before going out. Seek public restrooms when out in the community.       3)  pulled hamstring and gluts  L started one month ago with tennis playing and stretches too far reaching for the ball. Pain 8/10 at the time of injury, improve to a dull ache 4/10. Localized behind the leg and gluts. Throbbing pain with long drive, sitting for long periods.  Pt notices he leans to L side when sitting on the couch    Pertinent History  Nocturia is 2 x  night and has not been screened for a sleep study.  His wife tells him he snores for the past 15 years. Hx of CAD with catherization 10-15 years. Hx of anxiety but no panic attacks since 1990s. Heel spur surgery(Left)  2009. Bowel movements require Metamucil for the past 2 years.         [facility] PT Assessment - 12/23/19 0856      Assessment   Medical Diagnosis  scrotal pain     Referring Provider (PT)  Sninisky       Precautions   Precautions  None      Observation/Other Assessments   Scoliosis  R shoulder , L iliac crest higher, L lumbar convex curve       AROM   Overall AROM Comments  L sideflexion and rotaion more limited than R  Ambulation/Gait   Gait Comments  decreased R stance phase, limited L rotation UE armswings,  ( post Tx: shoe lift in R shoe, more equal weight bearing, arm swing)                   Objective measurements completed on examination: See above findings.      [facility] PT Treatment/Exercise - 12/23/19 0856      Therapeutic Activites    Other Therapeutic Activities  explained posture, fitness routine, lack of flexibility routine, anxiety, anatomy/ physiology related to scrotal pain. Assessed FOTO, Educated on proper sitting mechanics and avoiding sit ups and crunches  to minimize straining pelvic floor    Educated pt on the importance to screen for OSA to rule in/out as pt has risk factors : CAD, nocturia, snoring. Articles provided to pt.                  PT Long Term Goals - 12/23/19 0829      PT LONG TERM GOAL #1   Title  Pt will report being able to sit for long drive 2 hrs, taking full  stride  on the tennis court during matches, sitting for a movie for 2hrs without glut/ leg pain L.    Time  10    Period  Weeks    Status  New    Target Date  03/02/20      PT LONG TERM GOAL #2   Title  Pt will report decreased FOTO score for Pain ( 13 pts) and PFDI Urinary (13 pts) to <6 pts in order to restore function and participate in commmunity events    Time  8    Period  Weeks    Status  New    Target Date  02/17/20      PT LONG TERM GOAL #3   Title  Pt will demo equal iliac crest / shoulder alignment, less spinal deviations, more arm swing and reciprocal movement of thorax and pelvis in gait in order to progress to deep core exercsies and improve IAP system    Time  4    Period  Weeks    Status  New    Target Date  01/20/20      PT LONG TERM GOAL #4   Title  Pt will be IND with relaxation practices and flexibility program to compliment his tennis, aerobic, and weight lifting routine    Time  6    Period  Weeks    Status  New    Target Date  02/03/20      PT LONG TERM GOAL #5   Title  Pt will demo proper body mechanics and modifications to fitness routine to minimize straining pelvic floor mm to minimize urinary frequency , improve complete emptying of urine    Time  5    Period  Weeks    Status  New    Target Date  01/27/20      Additional Long Term Goals   Additional Long Term Goals  Yes      PT LONG TERM GOAL #6   Title  Pt will be follow up with sleep study to screen for OSA to see if nocturia is related with OSA given pt with risk factors    Time  10    Period  Weeks    Status  New    Target Date  03/02/20  Plan - 12/23/19 1558    Clinical Impression Statement Pt is a 69 yo male who presents with chronic L scrotal pain, incomplete emptying, and post-void dribble in additional to acute L hamstring/ glut pain. These issues impact his tennis playing, sitting, driving long distances.  Pt's clinical assessment showed scoliosis, leg length  difference, gait deviations, limited spinal mobility, and poor posture. Pt was provided a shoe lift in R shoe after which pt demo'd levelled iliac crest and shoulders, and more equal weight bearing in BLE but still showed limited spinal rotation and arm swing. Plan to address spine and SIJ / pelvic floor  at next session. Anticipate by addressing scoliosis and leg length difference pt's Sx will improve. Pt will benefit from regional interdependent approach given pt's presentations. Pt also will benefit from fitness modification as pt had performed sit ups and crunches and lacked flexibility routine with tennis playing, weight lifting, and aerobic condition. 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   Educated pt on the importance to screen for OSA to rule in/out as pt has risk factors : CAD, nocturia, snoring. Articles provided to pt.   Pt benefits from skilled PT    Personal Factors and Comorbidities  Age;Fitness    Examination-Activity Limitations  Toileting;Stand;Lift    Stability/Clinical Decision Making  Evolving/Moderate complexity    Clinical Decision Making  Moderate    Rehab Potential  Good    PT Frequency  1x / week    PT Duration  Other (comment)   10   PT Treatment/Interventions  Moist Heat;Gait training;Stair training;Functional mobility training;Neuromuscular re-education;Balance training;Therapeutic exercise;Therapeutic activities;Patient/family education;Manual techniques;Scar mobilization;Taping    Consulted and Agree with Plan of Care  Patient       Patient will benefit from skilled therapeutic intervention in order to improve the following deficits and impairments:  Decreased balance, Decreased endurance, Decreased mobility, Decreased coordination, Decreased strength, Postural dysfunction, Pain, Improper body mechanics, Decreased range of motion, Decreased knowledge of precautions, Decreased scar mobility, Hypomobility, Difficulty walking, Increased muscle spasms  Visit  Diagnosis: Other idiopathic scoliosis, thoracolumbar region  Leg length discrepancy  Other abnormalities of gait and mobility  Abnormal posture     Problem List Patient Active Problem List   Diagnosis Date Noted  . Benign prostatic hyperplasia with nocturia 10/19/2019  . Coronary artery disease 10/19/2019  . Gastroesophageal reflux disease with esophagitis 10/19/2019  . Encounter for general adult medical examination without abnormal findings 04/10/2017  . History of normocytic normochromic anemia 04/10/2017  . Vaccine counseling 03/07/2016  . Pure hypercholesterolemia 05/22/2015    Jerl Mina ,PT, DPT, E-RYT  12/23/2019, 4:21 PM  [place] MAIN [facility] SERVICES [address] [place], [place], [address] Phone: [phone]   Fax:  [phone]  Name: Cory Hoover MRN: [id number] Date of Birth: 20-Jul-1951

## 2019-12-23 NOTE — Patient Instructions (Signed)
Proper sitting posture to stack diaphragm over pelvis   Hold off on sit up and crunches

## 2019-12-30 ENCOUNTER — Ambulatory Visit: Payer: Medicare Other | Attending: Urology | Admitting: Physical Therapy

## 2019-12-30 ENCOUNTER — Other Ambulatory Visit: Payer: Self-pay

## 2019-12-30 DIAGNOSIS — R2689 Other abnormalities of gait and mobility: Secondary | ICD-10-CM | POA: Insufficient documentation

## 2019-12-30 DIAGNOSIS — R293 Abnormal posture: Secondary | ICD-10-CM | POA: Diagnosis present

## 2019-12-30 DIAGNOSIS — M4125 Other idiopathic scoliosis, thoracolumbar region: Secondary | ICD-10-CM | POA: Diagnosis not present

## 2019-12-30 DIAGNOSIS — M2170 Unequal limb length (acquired), unspecified site: Secondary | ICD-10-CM | POA: Insufficient documentation

## 2019-12-30 NOTE — Therapy (Signed)
[place] MAIN [facility] SERVICES [address] [place], [place], [address] Phone: [phone]   Fax:  [phone]  Physical Therapy Treatment  Patient Details  Name: Cory Hoover MRN: [id number] Date of Birth: 11-Oct-1950 Referring Provider (PT): Sninisky    Encounter Date: 12/30/2019  PT End of Session - 12/30/19 1115    Visit Number  2    Number of Visits  10    Date for PT Re-Evaluation  03/02/20    PT Start Time  0800    PT Stop Time  0900    PT Time Calculation (min)  60 min       Past Medical History:  Diagnosis Date  . Anxiety   . Coronary artery disease   . GERD (gastroesophageal reflux disease)   . High cholesterol   . History of impacted cerumen     Past Surgical History:  Procedure Laterality Date  . HEEL SPUR SURGERY Left   . VASECTOMY      There were no vitals filed for this visit.  Subjective Assessment - 12/30/19 0814    Subjective  Pt has been wearing the shoelift in shoe when outside the house but did not wear inside the house. . Pt also wears shoe inserts that he was given for plantar fasciits ( 10 years with surgery on R)  in both slippers at home    Pertinent History  nocturia is 2 x  night and has not been screened for a sleep study.  His wife tells him he snores for the past 15 years. Hx of CAD with catherization 10-15 years. Hx of anxiety but no panic attacks since 1990s. Heel spur surgery(Left)  2009. Bowel movements require Metamucil for the past 2 years.         [facility] PT Assessment - 12/30/19 [id number]      Observation/Other Assessments   Scoliosis  R shoulder , L iliac crest no longer,  L lumbar convex curve  is present       Coordination   Gross Motor Movements are Fluid and Coordinated  --   chest breathing      Palpation   Spinal mobility  increased tightness along posterior ribs T9-10, SA mm, Teres minor, Spinal segments cervical to thoracic hypomobile, T/L segments with convex curve                      [facility] Adult PT Treatment/Exercise - 12/30/19 0855      Therapeutic Activites    Other Therapeutic Activities  discussed the importance of sleep study screening nad provided rationale based on research and pt's medical comorbidity factors       Neuro Re-ed    Neuro Re-ed Details   cued for diaphragmatic breathing, new HEP for spinal mobility       Manual Therapy   Manual therapy comments  STM/MWM intercostals , scapular mm, PA/ Medial mob along segments of thoracic and cervical,                   PT Long Term Goals - 12/23/19 0829      PT LONG TERM GOAL #1   Title  Pt will report being able to sit for long drive 2 hrs, taking full stride  on the tennis court during matches, sitting for a movie for 2hrs without glut/ leg pain L.    Time  10    Period  Weeks    Status  New  Target Date  03/02/20      PT LONG TERM GOAL #2   Title  Pt will report decreased FOTO score for Pain ( 13 pts) and PFDI Urinary (13 pts) to <6 pts in order to restore function and participate in commmunity events    Time  8    Period  Weeks    Status  New    Target Date  02/17/20      PT LONG TERM GOAL #3   Title  Pt will demo equal iliac crest / shoulder alignment, less spinal deviations, more arm swing and reciprocal movement of thorax and pelvis in gait in order to progress to deep core exercsies and improve IAP system    Time  4    Period  Weeks    Status  New    Target Date  01/20/20      PT LONG TERM GOAL #4   Title  Pt will be IND with relaxation practices and flexibility program to compliment his tennis, aerobic, and weight lifting routine    Time  6    Period  Weeks    Status  New    Target Date  02/03/20      PT LONG TERM GOAL #5   Title  Pt will demo proper body mechanics and modifications to fitness routine to minimize straining pelvic floor mm to minimize urinary frequency , improve complete emptying of urine    Time  5    Period  Weeks    Status   New    Target Date  01/27/20      Additional Long Term Goals   Additional Long Term Goals  Yes      PT LONG TERM GOAL #6   Title  Pt will be follow up with sleep study to screen for OSA to see if nocturia is related with OSA given pt with risk factors    Time  10    Period  Weeks    Status  New    Target Date  03/02/20            Plan - 12/30/19 1116    Clinical Impression Statement  Pt's shoe lift has helped to level pt's pelvic girdle and shoulders. Pt demo'd limited thoracic mobility which improved with manual Tx. Pt required cues for less chest breathing and more diaphragmatic breathing which will help with pelvic floor function and relaxation ( Pt reported anxiety). Pt was educated the importance of screening of OSA and rationale. Pt continues to benefit from skilled PT    Personal Factors and Comorbidities  Age;Fitness    Examination-Activity Limitations  Toileting;Stand;Lift    Stability/Clinical Decision Making  Evolving/Moderate complexity    Rehab Potential  Good    PT Frequency  1x / week    PT Duration  Other (comment)   10   PT Treatment/Interventions  Moist Heat;Gait training;Stair training;Functional mobility training;Neuromuscular re-education;Balance training;Therapeutic exercise;Therapeutic activities;Patient/family education;Manual techniques;Scar mobilization;Taping    Consulted and Agree with Plan of Care  Patient       Patient will benefit from skilled therapeutic intervention in order to improve the following deficits and impairments:  Decreased balance, Decreased endurance, Decreased mobility, Decreased coordination, Decreased strength, Postural dysfunction, Pain, Improper body mechanics, Decreased range of motion, Decreased knowledge of precautions, Decreased scar mobility, Hypomobility, Difficulty walking, Increased muscle spasms  Visit Diagnosis: Other idiopathic scoliosis, thoracolumbar region  Leg length discrepancy  Other abnormalities of gait  and mobility  Abnormal posture  Problem List Patient Active Problem List   Diagnosis Date Noted  . Benign prostatic hyperplasia with nocturia 10/19/2019  . Coronary artery disease 10/19/2019  . Gastroesophageal reflux disease with esophagitis 10/19/2019  . Encounter for general adult medical examination without abnormal findings 04/10/2017  . History of normocytic normochromic anemia 04/10/2017  . Vaccine counseling 03/07/2016  . Pure hypercholesterolemia 05/22/2015    Jerl Mina ,PT, DPT, E-RYT  12/30/2019, 11:22 AM  [place] MAIN [facility] SERVICES [address] [place], [place], [address] Phone: [phone]   Fax:  [phone]  Name: HINSON VLASIC MRN: [id number] Date of Birth: [birth year]

## 2019-12-30 NOTE — Patient Instructions (Signed)
Stretches :    6 directions of the spine :  5 reps   Rotation: armswings to rotate the spine  Side flexion: arm overhead arching like a rainbow , both sides  Forward bend and strainghtening up: minisquat-  Like you are about to sit and then stand up with a slight lift in the chest   Lengthen Back rib by _ shoulder    Lie on   side , pillow between knees  Pull  arm overhead over mattress, grab the edge of mattress,pull it upward, drawing elbow away from ears  Breathing   Open book (handout)  Lying on  _ side , rotating  __ only this week   ___   On your back, pressing shoulders, elbows, and back of head down on exhalation to realign posture  10 reps   ___   body scan daily ( emailed)

## 2020-01-04 ENCOUNTER — Other Ambulatory Visit: Payer: Self-pay

## 2020-01-04 ENCOUNTER — Telehealth (INDEPENDENT_AMBULATORY_CARE_PROVIDER_SITE_OTHER): Payer: Medicare Other | Admitting: Urology

## 2020-01-04 ENCOUNTER — Encounter: Payer: Self-pay | Admitting: Urology

## 2020-01-04 DIAGNOSIS — N138 Other obstructive and reflux uropathy: Secondary | ICD-10-CM | POA: Diagnosis not present

## 2020-01-04 DIAGNOSIS — N3281 Overactive bladder: Secondary | ICD-10-CM | POA: Diagnosis not present

## 2020-01-04 DIAGNOSIS — R102 Pelvic and perineal pain: Secondary | ICD-10-CM | POA: Diagnosis not present

## 2020-01-04 DIAGNOSIS — N401 Enlarged prostate with lower urinary tract symptoms: Secondary | ICD-10-CM

## 2020-01-04 NOTE — Progress Notes (Signed)
Virtual Visit via Telephone Note  I connected with Cory Hoover on 01/04/20 at 11:45 AM EDT by telephone and verified that I am speaking with the correct person using two identifiers.   I discussed the limitations, risks, security and privacy concerns of performing an evaluation and management service by telephone and the availability of in person appointments. We discussed the impact of the COVID-19 pandemic on the healthcare system, and the importance of social distancing and reducing patient and provider exposure. I also discussed with the patient that there may be a patient responsible charge related to this service. The patient expressed understanding and agreed to proceed.  Reason for visit: BPH/OAB, scrotal pain  History of Present Illness: I had phone follow up with Cory Hoover today. I originally saw him in March of 2021 for chronic left scrotal pain and urinary symptoms.  Regarding his scrotal pain, this resolved after a 2-week course of NSAIDs, and he has also followed up with pelvic floor physical therapy.  His primary urinary complaints are urinary frequency during the day, and he is urinating 8-10 times during the day on max dose of Flomax 0.8 mg, which is improved from 15 times a day on single dose 0.4 mg.  He is currently happy with his urinary symptoms.  Follow Up: -Agree with sleep apnea evaluation as possible etiology of his nocturia -Continue Flomax 0.8 mg nightly -Continue pelvic floor physical therapy  -RTC 6 months with PVR and IPSS, discuss outlet procedures if persistent urinary symptoms  I discussed the assessment and treatment plan with the patient. The patient was provided an opportunity to ask questions and all were answered. The patient agreed with the plan and demonstrated an understanding of the instructions.   The patient was advised to call back or seek an in-person evaluation if the symptoms worsen or if the condition fails to improve as anticipated.  I  provided 15 minutes of non-face-to-face time during this encounter.   Billey Co, MD

## 2020-01-04 NOTE — Progress Notes (Signed)
Patient ID: Cory Hoover, male   DOB: 27-Mar-1951, 69 y.o.   MRN: [id number] This service is provided via telemedicine  No vital signs collected/recorded due to the encounter was a telemedicine visit.   Location of patient (ex: home, work):  HOME  Patient consents to a telephone visit:  YES  Location of the provider (ex: office, home):  OFFICE  Name of any referring provider:  DR Netty Starring  Names of all persons participating in the telemedicine service and their role in the encounter:  PATIENT, [name], [place], Home  Time spent on call:  4:50

## 2020-01-06 ENCOUNTER — Ambulatory Visit: Payer: Medicare Other | Admitting: Physical Therapy

## 2020-01-06 ENCOUNTER — Other Ambulatory Visit: Payer: Self-pay

## 2020-01-06 DIAGNOSIS — R2689 Other abnormalities of gait and mobility: Secondary | ICD-10-CM

## 2020-01-06 DIAGNOSIS — M2170 Unequal limb length (acquired), unspecified site: Secondary | ICD-10-CM

## 2020-01-06 DIAGNOSIS — M4125 Other idiopathic scoliosis, thoracolumbar region: Secondary | ICD-10-CM | POA: Diagnosis not present

## 2020-01-06 DIAGNOSIS — R293 Abnormal posture: Secondary | ICD-10-CM

## 2020-01-06 NOTE — Therapy (Signed)
[place] MAIN [facility] SERVICES [address] [place], [place], [address] Phone: [phone]   Fax:  [phone]  Physical Therapy Treatment  Patient Details  Name: Cory Hoover MRN: [id number] Date of Birth: 1951-06-04 Referring Provider (PT): Sninisky    Encounter Date: 01/06/2020   PT End of Session - 01/06/20 0910    Visit Number 3    Number of Visits 10    Date for PT Re-Evaluation 03/02/20    PT Start Time 0803    PT Stop Time 0904    PT Time Calculation (min) 61 min    Activity Tolerance Patient tolerated treatment well           Past Medical History:  Diagnosis Date  . Anxiety   . Coronary artery disease   . GERD (gastroesophageal reflux disease)   . High cholesterol   . History of impacted cerumen     Past Surgical History:  Procedure Laterality Date  . HEEL SPUR SURGERY Left   . VASECTOMY      There were no vitals filed for this visit.   Subjective Assessment - 01/06/20 0812    Subjective The shoelift has not given him any trouble. Pt noticed the stretches have made him looser and given him more ROM.    Pertinent History nocturia is 2 x  night and has not been screened for a sleep study.  His wife tells him he snores for the past 15 years. Hx of CAD with catherization 10-15 years. Hx of anxiety but no panic attacks since 1990s. Heel spur surgery(Left)  2009. Bowel movements require Metamucil for the past 2 years.              [name] PT Assessment - 01/06/20 1002      Observation/Other Assessments   Observations slumped posture, legs crossed       Palpation   Palpation comment tightness L  ischial tuberosity attachments                       Pelvic Floor Special Questions - 01/06/20 1000    External Perineal Exam Tightness at Obt int L  ( decreased Tx )              [facility] Adult PT Treatment/Exercise - 01/06/20 1737      Therapeutic Activites    Other Therapeutic Activities active  listening, motivational interviewing to help build compliance and internal locus of control for wellness       Neuro Re-ed    Neuro Re-ed Details  guided body scan, explained the research for this technique on relaxation and pain science      Manual Therapy   Manual therapy comments STM/MWM at L ischial tuberosity attachments                        PT Long Term Goals - 12/23/19 0829      PT LONG TERM GOAL #1   Title Pt will report being able to sit for long drive 2 hrs, taking full stride  on the tennis court during matches, sitting for a movie for 2hrs without glut/ leg pain L.    Time 10    Period Weeks    Status New    Target Date 03/02/20      PT LONG TERM GOAL #2   Title Pt will report decreased FOTO score for Pain ( 13 pts) and PFDI Urinary (  13 pts) to <6 pts in order to restore function and participate in commmunity events    Time 8    Period Weeks    Status New    Target Date 02/17/20      PT LONG TERM GOAL #3   Title Pt will demo equal iliac crest / shoulder alignment, less spinal deviations, more arm swing and reciprocal movement of thorax and pelvis in gait in order to progress to deep core exercsies and improve IAP system    Time 4    Period Weeks    Status New    Target Date 01/20/20      PT LONG TERM GOAL #4   Title Pt will be IND with relaxation practices and flexibility program to compliment his tennis, aerobic, and weight lifting routine    Time 6    Period Weeks    Status New    Target Date 02/03/20      PT LONG TERM GOAL #5   Title Pt will demo proper body mechanics and modifications to fitness routine to minimize straining pelvic floor mm to minimize urinary frequency , improve complete emptying of urine    Time 5    Period Weeks    Status New    Target Date 01/27/20      Additional Long Term Goals   Additional Long Term Goals Yes      PT LONG TERM GOAL #6   Title Pt will be follow up with sleep study to screen for OSA to see if  nocturia is related with OSA given pt with risk factors    Time 10    Period Weeks    Status New    Target Date 03/02/20                 Plan - 01/06/20 0911    Clinical Impression Statement Pt required motivational interviewing and active listening to help pt understand his barriers towards wellness. Pt has agreed to follow up with sleep study.   Pt demo'd more levelled pelvic girdle and shoulder height with more equal gait pattern with the shoe lift provided at last session.   Today, pt required seated version of body scan and was educated more on the research behind this stress management technique and how stress management is helpful with pelvic pain. Pt also required external pelvic floor manual Tx to minimize tightness of obturator internus on L which Iikely related to his report of having had a hamstring strain.   Pt continues to benefit from skilled PT    Personal Factors and Comorbidities Age;Fitness    Examination-Activity Limitations Toileting;Stand;Lift    Stability/Clinical Decision Making Evolving/Moderate complexity    Rehab Potential Good    PT Frequency 1x / week    PT Duration Other (comment)   10   PT Treatment/Interventions Moist Heat;Gait training;Stair training;Functional mobility training;Neuromuscular re-education;Balance training;Therapeutic exercise;Therapeutic activities;Patient/family education;Manual techniques;Scar mobilization;Taping    Consulted and Agree with Plan of Care Patient           Patient will benefit from skilled therapeutic intervention in order to improve the following deficits and impairments:  Decreased balance, Decreased endurance, Decreased mobility, Decreased coordination, Decreased strength, Postural dysfunction, Pain, Improper body mechanics, Decreased range of motion, Decreased knowledge of precautions, Decreased scar mobility, Hypomobility, Difficulty walking, Increased muscle spasms  Visit Diagnosis: Other idiopathic  scoliosis, thoracolumbar region  Leg length discrepancy  Other abnormalities of gait and mobility  Abnormal posture     Problem  List Patient Active Problem List   Diagnosis Date Noted  . Benign prostatic hyperplasia with nocturia 10/19/2019  . Coronary artery disease 10/19/2019  . Gastroesophageal reflux disease with esophagitis 10/19/2019  . Encounter for general adult medical examination without abnormal findings 04/10/2017  . History of normocytic normochromic anemia 04/10/2017  . Vaccine counseling 03/07/2016  . Pure hypercholesterolemia 05/22/2015    Jerl Mina ,PT, DPT, E-RYT'  01/06/2020, 5:45 PM  [place] MAIN [facility] SERVICES [address] [place], [place], [address] Phone: [phone]   Fax:  [phone]  Name: Cory Hoover MRN: [id number] Date of Birth: 1951-04-04

## 2020-01-06 NOTE — Patient Instructions (Signed)
Body scan in seated position, notice the edges of the support against your body   __  Stretch for pelvic floor   V- slides  "v heels slide away and then back toward buttocks and then rock knee to slight ,  slide heel along at 11 o clock away from buttocks   10 reps         3- foot tap  10 reps  Each side   Hold onto wall   Slightly bend of standing knee, and keep hips above foot   ballmound of opposite leg   taps to each direction and   back to spot under hips- notice equal pressure through both legs, and across ballmound and heels    ___

## 2020-01-13 ENCOUNTER — Ambulatory Visit: Payer: Medicare Other | Admitting: Physical Therapy

## 2020-01-13 ENCOUNTER — Other Ambulatory Visit: Payer: Self-pay

## 2020-01-13 DIAGNOSIS — M2170 Unequal limb length (acquired), unspecified site: Secondary | ICD-10-CM

## 2020-01-13 DIAGNOSIS — M4125 Other idiopathic scoliosis, thoracolumbar region: Secondary | ICD-10-CM | POA: Diagnosis not present

## 2020-01-13 DIAGNOSIS — R293 Abnormal posture: Secondary | ICD-10-CM

## 2020-01-13 DIAGNOSIS — R2689 Other abnormalities of gait and mobility: Secondary | ICD-10-CM

## 2020-01-13 NOTE — Therapy (Signed)
[place] MAIN [facility] SERVICES [address] [place], [place], [address] Phone: [phone]   Fax:  [phone]  Physical Therapy Treatment  Patient Details  Name: Cory Hoover MRN: [id number] Date of Birth: June 06, 1951 Referring Provider (PT): Sninisky    Encounter Date: 01/13/2020   PT End of Session - 01/13/20 0904    Visit Number 4    Number of Visits 10    Date for PT Re-Evaluation 03/02/20    PT Start Time 0802    PT Stop Time 0904    PT Time Calculation (min) 62 min    Activity Tolerance Patient tolerated treatment well           Past Medical History:  Diagnosis Date  . Anxiety   . Coronary artery disease   . GERD (gastroesophageal reflux disease)   . High cholesterol   . History of impacted cerumen     Past Surgical History:  Procedure Laterality Date  . HEEL SPUR SURGERY Left   . VASECTOMY      There were no vitals filed for this visit.   Subjective Assessment - 01/13/20 0806    Subjective Pt has no questions from last week    Pertinent History nocturia is 2 x  night and has not been screened for a sleep study.  His wife tells him he snores for the past 15 years. Hx of CAD with catherization 10-15 years. Hx of anxiety but no panic attacks since 1990s. Heel spur surgery(Left)  2009. Bowel movements require Metamucil for the past 2 years.              [facility] PT Assessment - 01/13/20 0809      Observation/Other Assessments   Scoliosis shoulders/ iliac crest  levelled with shoe lift in R LE , R convex lower thoracic,  L lumbar  convex ( post Tx: no convex curves at T/L junction)       AROM   Overall AROM Comments standing hip ext: R 15 deg, L 30 deg       Strength   Overall Strength Comments L  knee flex 3+/5, R 4+/5, ( Post Tx: L 4+/5)  PF with single UE R 26 reps, 3+/5,  L 14 reps 3-/5        Palpation   Spinal mobility increased paraspinal R, intercostals T10-12 tightness, limited  lateral and posterior  excursion, upper traps tightness                       Pelvic Floor Special Questions - 01/13/20 0912    External Perineal Exam tightness at anterior mm tightness B '             [facility] Adult PT Treatment/Exercise - 01/13/20 0911      Therapeutic Activites    Other Therapeutic Activities explained the role of slumped posture on pelvic floor health        Neuro Re-ed    Neuro Re-ed Details  cued for less chest breathinng , more diaphramgatic breathing,       Manual Therapy   Manual therapy comments Medial glide at T10-12, STM/MWM intercostals , anterior triangle pelvic floor mm   , upper traps                       PT Long Term Goals - 01/13/20 0816      PT LONG TERM GOAL #1   Title Pt will  report being able to sit for long drive 2 hrs, taking full stride  on the tennis court during matches, sitting for a movie for 2hrs without glut/ leg pain L.    Time 10    Period Weeks    Status On-going      PT LONG TERM GOAL #2   Title Pt will report decreased FOTO score for Pain ( 13 pts) and PFDI Urinary (13 pts) to <6 pts in order to restore function and participate in commmunity events    Time 8    Period Weeks    Status New      PT LONG TERM GOAL #3   Title Pt will demo equal iliac crest / shoulder alignment, less spinal deviations, more arm swing and reciprocal movement of thorax and pelvis in gait in order to progress to deep core exercsies and improve IAP system    Time 4    Period Weeks    Status Achieved      PT LONG TERM GOAL #4   Title Pt will be IND with relaxation practices and flexibility program to compliment his tennis, aerobic, and weight lifting routine    Time 6    Period Weeks    Status On-going      PT LONG TERM GOAL #5   Title Pt will demo proper body mechanics and modifications to fitness routine to minimize straining pelvic floor mm to minimize urinary frequency , improve complete emptying of urine    Time 5    Period Weeks     Status On-going      Additional Long Term Goals   Additional Long Term Goals Yes      PT LONG TERM GOAL #6   Title Pt will be follow up with sleep study to screen for OSA to see if nocturia is related with OSA given pt with risk factors    Time 10    Period Weeks    Status On-going      PT LONG TERM GOAL #7   Title Pt will increase L knee flex to > 4+/5, PF with single UE on wall on L from 3-/5 to 4/5 to > 20 reps , R PF 4/5 in order to play tennis for longer periods and minimize risk for hamstring injuries    Baseline L  knee flex 3+/5, R 4+/5,  PF with single UE R 26 reps, 3+/5,  L 14 reps 3-/5    Time 8    Period Weeks    Status New    Target Date 03/09/20                 Plan - 01/13/20 0904    Clinical Impression Statement Pt demo'd levelled shoulders and pelvic girdle but still demo'd forward head/ thoracic kyphosis. Manual Tx addressed these deficits and pt demo'd more upright posture. Pt's report of hamstring injury is likely related to paraspinal tightness which is related to his kyphotic posture. Following manual Tx that decreased tightness to paraspinal mm, pt's knee extension strength improved which is a good indicator of regional interdependent approach is effective for pt's progress.  Anterior pelvic floor mm were also tight and released with external Tx. Pt demo'd improved diaphragmatic breathing with more movement of pelvic floor with correct technique and excessive cues for less chest breathing. Pt demo'd improved deep core coordination post Tx and will be able to advance to Deep core level 2 at next session.  Pt continues to benefit from  skilled PT    Personal Factors and Comorbidities Age;Fitness    Examination-Activity Limitations Toileting;Stand;Lift    Stability/Clinical Decision Making Evolving/Moderate complexity    Rehab Potential Good    PT Frequency 1x / week    PT Duration Other (comment)   10   PT Treatment/Interventions Moist Heat;Gait training;Stair  training;Functional mobility training;Neuromuscular re-education;Balance training;Therapeutic exercise;Therapeutic activities;Patient/family education;Manual techniques;Scar mobilization;Taping    Consulted and Agree with Plan of Care Patient           Patient will benefit from skilled therapeutic intervention in order to improve the following deficits and impairments:  Decreased balance, Decreased endurance, Decreased mobility, Decreased coordination, Decreased strength, Postural dysfunction, Pain, Improper body mechanics, Decreased range of motion, Decreased knowledge of precautions, Decreased scar mobility, Hypomobility, Difficulty walking, Increased muscle spasms  Visit Diagnosis: Other idiopathic scoliosis, thoracolumbar region  Leg length discrepancy  Other abnormalities of gait and mobility  Abnormal posture     Problem List Patient Active Problem List   Diagnosis Date Noted  . Benign prostatic hyperplasia with nocturia 10/19/2019  . Coronary artery disease 10/19/2019  . Gastroesophageal reflux disease with esophagitis 10/19/2019  . Encounter for general adult medical examination without abnormal findings 04/10/2017  . History of normocytic normochromic anemia 04/10/2017  . Vaccine counseling 03/07/2016  . Pure hypercholesterolemia 05/22/2015    Jerl Mina ,PT, DPT, E-RYT  01/13/2020, 11:00 AM  [place] MAIN [facility] SERVICES [address] [place], [place], [address] Phone: [phone]   Fax:  [phone]  Name: Cory Hoover MRN: [id number] Date of Birth: [birth year]

## 2020-01-13 NOTE — Patient Instructions (Signed)
Chin tuck with open book    Take breaks every hour, 10 chin tucks   Shoulder circle backwards  10 reps       Avoid straining pelvic floor, abdominal muscles , spine  Use log rolling technique instead of getting out of bed with your neck or the sit-up     Log rolling into and out of bed   Log rolling into and out of bed If getting out of bed on R side, Bent knees, scoot hips/ shoulder to L  Raise R arm completely overhead, rolling onto armpit  Then lower bent knees to bed to get into complete side lying position  Then drop legs off bed, and push up onto R elbow/forearm, and use L hand to push onto the bed

## 2020-01-20 ENCOUNTER — Other Ambulatory Visit: Payer: Self-pay

## 2020-01-20 ENCOUNTER — Ambulatory Visit: Payer: Medicare Other | Admitting: Physical Therapy

## 2020-01-20 DIAGNOSIS — R2689 Other abnormalities of gait and mobility: Secondary | ICD-10-CM

## 2020-01-20 DIAGNOSIS — M4125 Other idiopathic scoliosis, thoracolumbar region: Secondary | ICD-10-CM | POA: Diagnosis not present

## 2020-01-20 DIAGNOSIS — M2170 Unequal limb length (acquired), unspecified site: Secondary | ICD-10-CM

## 2020-01-20 DIAGNOSIS — R293 Abnormal posture: Secondary | ICD-10-CM

## 2020-01-20 NOTE — Therapy (Signed)
[place] MAIN [facility] SERVICES [address] [place], [place], [address] Phone: [phone]   Fax:  [phone]  Physical Therapy Treatment  Patient Details  Name: Cory Hoover MRN: [id number] Date of Birth: 06/16/1951 Referring Provider (PT): Sninisky    Encounter Date: 01/20/2020   PT End of Session - 01/20/20 0902    Visit Number 5    Number of Visits 10    Date for PT Re-Evaluation 03/02/20    PT Start Time 0802    PT Stop Time 0904    PT Time Calculation (min) 62 min    Activity Tolerance Patient tolerated treatment well           Past Medical History:  Diagnosis Date  . Anxiety   . Coronary artery disease   . GERD (gastroesophageal reflux disease)   . High cholesterol   . History of impacted cerumen     Past Surgical History:  Procedure Laterality Date  . HEEL SPUR SURGERY Left   . VASECTOMY      There were no vitals filed for this visit.   Subjective Assessment - 01/20/20 0858    Subjective Pt notices he is looser in his body. Pt has been doing situps. Pt wants to know what to do for low ab, groin, back because he has had issues in these areas. Pt wears a diaper on airplane travels.     Pertinent History nocturia is 2 x  night and has not been screened for a sleep study.  His wife tells him he snores for the past 15 years. Hx of CAD with catherization 10-15 years. Hx of anxiety but no panic attacks since 1990s. Heel spur surgery(Left)  2009. Bowel movements require Metamucil for the past 2 years.              [facility] PT Assessment - 01/20/20 0859      Observation/Other Assessments   Observations limited body awareness, requiring tactile and vistual cues       Posture/Postural Control   Posture Comments diaphragm to pelvis more erect, forward head and shoulders still flexed, poor dissoassication of pelvis/legs and thorax                          [facility] Adult PT Treatment/Exercise - 01/20/20 0859       Neuro Re-ed    Neuro Re-ed Details  excessive cues for dissoaciation of pelvis and legs/ thorax                         PT Long Term Goals - 01/13/20 0816      PT LONG TERM GOAL #1   Title Pt will report being able to sit for long drive 2 hrs, taking full stride  on the tennis court during matches, sitting for a movie for 2hrs without glut/ leg pain L.    Time 10    Period Weeks    Status On-going      PT LONG TERM GOAL #2   Title Pt will report decreased FOTO score for Pain ( 13 pts) and PFDI Urinary (13 pts) to <6 pts in order to restore function and participate in commmunity events    Time 8    Period Weeks    Status New      PT LONG TERM GOAL #3   Title Pt will demo equal iliac crest / shoulder alignment, less spinal  deviations, more arm swing and reciprocal movement of thorax and pelvis in gait in order to progress to deep core exercsies and improve IAP system    Time 4    Period Weeks    Status Achieved      PT LONG TERM GOAL #4   Title Pt will be IND with relaxation practices and flexibility program to compliment his tennis, aerobic, and weight lifting routine    Time 6    Period Weeks    Status On-going      PT LONG TERM GOAL #5   Title Pt will demo proper body mechanics and modifications to fitness routine to minimize straining pelvic floor mm to minimize urinary frequency , improve complete emptying of urine    Time 5    Period Weeks    Status On-going      Additional Long Term Goals   Additional Long Term Goals Yes      PT LONG TERM GOAL #6   Title Pt will be follow up with sleep study to screen for OSA to see if nocturia is related with OSA given pt with risk factors    Time 10    Period Weeks    Status On-going      PT LONG TERM GOAL #7   Title Pt will increase L knee flex to > 4+/5, PF with single UE on wall on L from 3-/5 to 4/5 to > 20 reps , R PF 4/5 in order to play tennis for longer periods and minimize risk for hamstring injuries      Baseline L  knee flex 3+/5, R 4+/5,  PF with single UE R 26 reps, 3+/5,  L 14 reps 3-/5    Time 8    Period Weeks    Status New    Target Date 03/09/20                 Plan - 01/20/20 0903    Clinical Impression Statement Pt demo'd good carry over with less slumped posture between ribs and pelvis but still dem'd forward head/ thoracic kyphosis .Added workstretches and pre/post tennis stretchesthat will help will mobility in spine and pectoralis/ upper trap mm which are dominate in tennis playing. Pt required excessive cues for diassociation of trunk and pelvis and equal wegiht bearing in BLE. Pt continues to benefit from skilled PT. Plan to add deep core exercises and scapulothoracic strengthening at next sesion    Personal Factors and Comorbidities Age;Fitness    Examination-Activity Limitations Toileting;Stand;Lift    Stability/Clinical Decision Making Evolving/Moderate complexity    Rehab Potential Good    PT Frequency 1x / week    PT Duration Other (comment)   10   PT Treatment/Interventions Moist Heat;Gait training;Stair training;Functional mobility training;Neuromuscular re-education;Balance training;Therapeutic exercise;Therapeutic activities;Patient/family education;Manual techniques;Scar mobilization;Taping    Consulted and Agree with Plan of Care Patient           Patient will benefit from skilled therapeutic intervention in order to improve the following deficits and impairments:  Decreased balance, Decreased endurance, Decreased mobility, Decreased coordination, Decreased strength, Postural dysfunction, Pain, Improper body mechanics, Decreased range of motion, Decreased knowledge of precautions, Decreased scar mobility, Hypomobility, Difficulty walking, Increased muscle spasms  Visit Diagnosis: Other idiopathic scoliosis, thoracolumbar region  Leg length discrepancy  Other abnormalities of gait and mobility  Abnormal posture     Problem List Patient Active  Problem List   Diagnosis Date Noted  . Benign prostatic hyperplasia with nocturia 10/19/2019  .  Coronary artery disease 10/19/2019  . Gastroesophageal reflux disease with esophagitis 10/19/2019  . Encounter for general adult medical examination without abnormal findings 04/10/2017  . History of normocytic normochromic anemia 04/10/2017  . Vaccine counseling 03/07/2016  . Pure hypercholesterolemia 05/22/2015    Jerl Mina  ,PT, DPT, E-RYT  01/20/2020, 9:06 AM  [place] MAIN [facility] SERVICES [address] [place], [place], [address] Phone: [phone]   Fax:  [phone]  Name: Cory Hoover MRN: [id number] Date of Birth: 1951/03/19

## 2020-01-20 NOTE — Patient Instructions (Addendum)
  Perform after hour of desk work   And before and after tennis:      During the day:  Stretches : (Cuing provided for proper alignment6 directions 6 directions of psine   Side lean and then a palm up (high five to the sky)  And then slight rotation of ribs by looking up to the sky) 5 reps   armswings without moving the hips, sink into the legs and feet 5 reps   minisquat ( knees behind toes) , pull arms back, rise up, chest lifts, look up      ___  Bear stretch at door way  Wall squat with head and elbow press  inhale down, exhale up   10 reps each   ___  NO more sit ups

## 2020-01-27 ENCOUNTER — Ambulatory Visit: Payer: Medicare Other | Attending: Urology | Admitting: Physical Therapy

## 2020-01-27 ENCOUNTER — Other Ambulatory Visit: Payer: Self-pay

## 2020-01-27 DIAGNOSIS — R293 Abnormal posture: Secondary | ICD-10-CM | POA: Insufficient documentation

## 2020-01-27 DIAGNOSIS — M2170 Unequal limb length (acquired), unspecified site: Secondary | ICD-10-CM | POA: Diagnosis present

## 2020-01-27 DIAGNOSIS — M4125 Other idiopathic scoliosis, thoracolumbar region: Secondary | ICD-10-CM | POA: Insufficient documentation

## 2020-01-27 DIAGNOSIS — R2689 Other abnormalities of gait and mobility: Secondary | ICD-10-CM | POA: Diagnosis present

## 2020-01-27 NOTE — Therapy (Signed)
[place] MAIN [facility] SERVICES [address] [place], [place], [address] Phone: [phone]   Fax:  [phone]  Physical Therapy Treatment  Patient Details  Name: Cory Hoover MRN: [id number] Date of Birth: 1951-06-20 Referring Provider (PT): Sninisky    Encounter Date: 01/27/2020   PT End of Session - 01/27/20 0821    Visit Number 6    Number of Visits 10    Date for PT Re-Evaluation 03/02/20    PT Start Time 0806    PT Stop Time 0900    PT Time Calculation (min) 54 min    Activity Tolerance Patient tolerated treatment well           Past Medical History:  Diagnosis Date  . Anxiety   . Coronary artery disease   . GERD (gastroesophageal reflux disease)   . High cholesterol   . History of impacted cerumen     Past Surgical History:  Procedure Laterality Date  . HEEL SPUR SURGERY Left   . VASECTOMY      There were no vitals filed for this visit.   Subjective Assessment - 01/27/20 0812    Subjective Pt reported he played the best tennis match in a long time this past week. HIs mates called him a new nickname "Surgeon" because of the precision of his shots.  Pt reports he has urinary frequency, difficulty with completing emptying, enlarged prostate, and Hx of L testicle pain since his early 67s.  Pt associated it with a vasectomy. This pain occurs randomly since this time. Alleve will relieve the pain but it does not eliminate it. When the pain occurs, it radiates from the testicle to the L lateral hip and lower abdominal mm. The pain feels like it swells and then it decreases. The last time this pain occurred in Nov 2020 and it went away in Feb-Mar.    Pertinent History nocturia is 2 x  night and has not been screened for a sleep study.  His wife tells him he snores for the past 15 years. Hx of CAD with catherization 10-15 years. Hx of anxiety but no panic attacks since 1990s. Heel spur surgery(Left)  2009. Bowel movements require  Metamucil for the past 2 years.              [facility] PT Assessment - 01/27/20 0837      Observation/Other Assessments   Observations ankles crossed       AROM   Overall AROM Comments standing hip ext: R 25 deg, L 30 deg       Strength   Overall Strength Comments B hip flex 4+/5, knee ext 4+/5,  knee flex 4-/5         Palpation   Palpation comment increased fascial restrictions over L abdomen/ suprapubic area       Bed Mobility   Bed Mobility --   crunch method                      Pelvic Floor Special Questions - 01/27/20 1120    External Perineal Exam tightness at L bulbospongiosus              [facility] Adult PT Treatment/Exercise - 01/27/20 0837      Therapeutic Activites    Other Therapeutic Activities explained the anatomy/ physiology and role of posture/ straining pelvic floor impacting his Sx, explained nervous system and emptying urine and how relaxation is requried for emptying urine / decreasing  frequency       Neuro Re-ed    Neuro Re-ed Details  ecued for log rollig to minimize strainign ab/ pelvic floor / spine       Manual Therapy   Manual therapy comments fascial release over L LQ / suprapubic area, STM/MWM at bulbospongiosus L                        PT Long Term Goals - 01/27/20 0835      PT LONG TERM GOAL #1   Title Pt will report being able to sit for long drive 2 hrs, taking full stride  on the tennis court during matches, sitting for a movie for 2hrs without glut/ leg pain L.    Time 10    Period Weeks    Status On-going      PT LONG TERM GOAL #2   Title Pt will report decreased FOTO score for Pain ( 13 pts) and PFDI Urinary (13 pts) to <6 pts in order to restore function and participate in commmunity events    Time 8    Period Weeks    Status On-going      PT LONG TERM GOAL #3   Title Pt will demo equal iliac crest / shoulder alignment, less spinal deviations, more arm swing and reciprocal movement of thorax and  pelvis in gait in order to progress to deep core exercsies and improve IAP system    Time 4    Period Weeks    Status Achieved      PT LONG TERM GOAL #4   Title Pt will be IND with relaxation practices and flexibility program to compliment his tennis, aerobic, and weight lifting routine    Time 6    Period Weeks    Status On-going      PT LONG TERM GOAL #5   Title Pt will demo proper body mechanics and modifications to fitness routine to minimize straining pelvic floor mm to minimize urinary frequency , improve complete emptying of urine    Time 5    Period Weeks    Status On-going      Additional Long Term Goals   Additional Long Term Goals Yes      PT LONG TERM GOAL #6   Title Pt will be follow up with sleep study to screen for OSA to see if nocturia is related with OSA given pt with risk factors    Time 10    Period Weeks    Status On-going      PT LONG TERM GOAL #7   Title Pt will increase L knee flex to > 4+/5, PF with single UE on wall on L from 3-/5 to 4/5 to > 20 reps , R PF 4/5 in order to play tennis for longer periods and minimize risk for hamstring injuries    Baseline L  knee flex 3+/5, R 4+/5,  PF with single UE R 26 reps, 3+/5,  L 14 reps 3-/5    Time 8    Period Weeks    Status On-going      PT LONG TERM GOAL #8   Title Pt will report being able to dismount off his bike with R leg over the sit and clear the seat and not fall in order to continue to bike safely and not injure other body parts .    Time 8    Period Weeks    Status New  Target Date 03/23/20                 Plan - 01/27/20 1123    Clinical Impression Statement Pt is making excellent progress with increased spinal mobility and less thoracic kyphosis which has helped pt to play one of his best tennis match in a long time. Explained the necessity to correct his posture, optimize diaphragm and decrease thoracic kyphosis in order to  help with difficulty with emptying urine and minimize  urinary frequency, minimize L testicular pain .  Today, assessed pelvic floor externally which showed increased L LQ/ suprapubic  fascial restrictions and tightness at L bulbospongiosus which is the area where he notices his testicular pain when it does occur ( currently not ocurring).  Following external manual Tx, pt demo'd increased mobility in these areas. Plan to provide a complimentary stretch for this area to counteract twists in tennis playing.  Pt continues to benefit from skilled PT    Personal Factors and Comorbidities Age;Fitness    Examination-Activity Limitations Toileting;Stand;Lift    Stability/Clinical Decision Making Evolving/Moderate complexity    Rehab Potential Good    PT Frequency 1x / week    PT Duration Other (comment)   10   PT Treatment/Interventions Moist Heat;Gait training;Stair training;Functional mobility training;Neuromuscular re-education;Balance training;Therapeutic exercise;Therapeutic activities;Patient/family education;Manual techniques;Scar mobilization;Taping    Consulted and Agree with Plan of Care Patient           Patient will benefit from skilled therapeutic intervention in order to improve the following deficits and impairments:  Decreased balance, Decreased endurance, Decreased mobility, Decreased coordination, Decreased strength, Postural dysfunction, Pain, Improper body mechanics, Decreased range of motion, Decreased knowledge of precautions, Decreased scar mobility, Hypomobility, Difficulty walking, Increased muscle spasms  Visit Diagnosis: Other idiopathic scoliosis, thoracolumbar region  Leg length discrepancy  Other abnormalities of gait and mobility  Abnormal posture     Problem List Patient Active Problem List   Diagnosis Date Noted  . Benign prostatic hyperplasia with nocturia 10/19/2019  . Coronary artery disease 10/19/2019  . Gastroesophageal reflux disease with esophagitis 10/19/2019  . Encounter for general adult medical  examination without abnormal findings 04/10/2017  . History of normocytic normochromic anemia 04/10/2017  . Vaccine counseling 03/07/2016  . Pure hypercholesterolemia 05/22/2015    Jerl Mina ,PT, DPT, E-RYT  01/27/2020, 11:41 AM  [place] MAIN [facility] SERVICES [address] [place], [place], [address] Phone: [phone]   Fax:  [phone]  Name: Cory Hoover MRN: [id number] Date of Birth: 26-Mar-1951

## 2020-01-27 NOTE — Patient Instructions (Addendum)
Education  On nerves to bladder, emptying process, causes for frequency        Avoid straining pelvic floor, abdominal muscles , spine  Use log rolling technique instead of getting out of bed with your neck or the sit-up     Log rolling into and out of bed   Log rolling into and out of bed If getting out of bed on R side, Bent knees, scoot hips/ shoulder to L  Raise R arm completely overhead, rolling onto armpit  Then lower bent knees to bed to get into complete side lying position  Then drop legs off bed, and push up onto R elbow/forearm, and use L hand to push onto the bed

## 2020-02-03 ENCOUNTER — Other Ambulatory Visit: Payer: Self-pay

## 2020-02-03 ENCOUNTER — Ambulatory Visit: Payer: Medicare Other | Admitting: Physical Therapy

## 2020-02-03 DIAGNOSIS — M4125 Other idiopathic scoliosis, thoracolumbar region: Secondary | ICD-10-CM

## 2020-02-03 DIAGNOSIS — M2170 Unequal limb length (acquired), unspecified site: Secondary | ICD-10-CM

## 2020-02-03 DIAGNOSIS — R2689 Other abnormalities of gait and mobility: Secondary | ICD-10-CM

## 2020-02-03 DIAGNOSIS — R293 Abnormal posture: Secondary | ICD-10-CM

## 2020-02-03 NOTE — Therapy (Signed)
[place] MAIN [facility] SERVICES [address] [place], [place], [address] Phone: [phone]   Fax:  [phone]  Physical Therapy Treatment  Patient Details  Name: Cory Hoover MRN: [id number] Date of Birth: May 07, 1951 Referring Provider (PT): Sninisky    Encounter Date: 02/03/2020   PT End of Session - 02/03/20 0853    Visit Number 7    Number of Visits 10    Date for PT Re-Evaluation 03/02/20    PT Start Time 0804    PT Stop Time 9892    PT Time Calculation (min) 53 min    Activity Tolerance Patient tolerated treatment well           Past Medical History:  Diagnosis Date  . Anxiety   . Coronary artery disease   . GERD (gastroesophageal reflux disease)   . High cholesterol   . History of impacted cerumen     Past Surgical History:  Procedure Laterality Date  . HEEL SPUR SURGERY Left   . VASECTOMY      There were no vitals filed for this visit.   Subjective Assessment - 02/03/20 0806    Subjective Pt reports he is still playing tennis well.    Pertinent History nocturia is 2 x  night and has not been screened for a sleep study.  His wife tells him he snores for the past 15 years. Hx of CAD with catherization 10-15 years. Hx of anxiety but no panic attacks since 1990s. Heel spur surgery(Left)  2009. Bowel movements require Metamucil for the past 2 years.              [facility] PT Assessment - 02/03/20 0900      Observation/Other Assessments   Observations thoracolumbar strengthening exercise with rounded shoulders, improved with cues       Palpation   Palpation comment increased fascial restrictions over  abdomen/ suprapubic area                          [facility] Adult PT Treatment/Exercise - 02/03/20 0900      Neuro Re-ed    Neuro Re-ed Details  cued for floor<> stand and new HEP for throacolumabr strengthening       Manual Therapy   Manual therapy comments fascial release over suprapubic area , all  quadrants of abdomen                        PT Long Term Goals - 01/27/20 0835      PT LONG TERM GOAL #1   Title Pt will report being able to sit for long drive 2 hrs, taking full stride  on the tennis court during matches, sitting for a movie for 2hrs without glut/ leg pain L.    Time 10    Period Weeks    Status On-going      PT LONG TERM GOAL #2   Title Pt will report decreased FOTO score for Pain ( 13 pts) and PFDI Urinary (13 pts) to <6 pts in order to restore function and participate in commmunity events    Time 8    Period Weeks    Status On-going      PT LONG TERM GOAL #3   Title Pt will demo equal iliac crest / shoulder alignment, less spinal deviations, more arm swing and reciprocal movement of thorax and pelvis in gait in order to progress to deep  core exercsies and improve IAP system    Time 4    Period Weeks    Status Achieved      PT LONG TERM GOAL #4   Title Pt will be IND with relaxation practices and flexibility program to compliment his tennis, aerobic, and weight lifting routine    Time 6    Period Weeks    Status On-going      PT LONG TERM GOAL #5   Title Pt will demo proper body mechanics and modifications to fitness routine to minimize straining pelvic floor mm to minimize urinary frequency , improve complete emptying of urine    Time 5    Period Weeks    Status On-going      Additional Long Term Goals   Additional Long Term Goals Yes      PT LONG TERM GOAL #6   Title Pt will be follow up with sleep study to screen for OSA to see if nocturia is related with OSA given pt with risk factors    Time 10    Period Weeks    Status On-going      PT LONG TERM GOAL #7   Title Pt will increase L knee flex to > 4+/5, PF with single UE on wall on L from 3-/5 to 4/5 to > 20 reps , R PF 4/5 in order to play tennis for longer periods and minimize risk for hamstring injuries    Baseline L  knee flex 3+/5, R 4+/5,  PF with single UE R 26 reps, 3+/5,  L  14 reps 3-/5    Time 8    Period Weeks    Status On-going      PT LONG TERM GOAL #8   Title Pt will report being able to dismount off his bike with R leg over the sit and clear the seat and not fall in order to continue to bike safely and not injure other body parts .    Time 8    Period Weeks    Status New    Target Date 03/23/20                 Plan - 02/03/20 0854    Clinical Impression Statement Pt progressed to thoracoumbar strengthening today with excessive cues for technique/ alignment.  Pt required further manual Tx to minimize abdominal fascial restrictions which will help with increasing mobility of anterior triangle of pelvic floor mm. Pt continues to benefit from skilled PT.    Personal Factors and Comorbidities Age;Fitness    Examination-Activity Limitations Toileting;Stand;Lift    Stability/Clinical Decision Making Evolving/Moderate complexity    Rehab Potential Good    PT Frequency 1x / week    PT Duration Other (comment)   10   PT Treatment/Interventions Moist Heat;Gait training;Stair training;Functional mobility training;Neuromuscular re-education;Balance training;Therapeutic exercise;Therapeutic activities;Patient/family education;Manual techniques;Scar mobilization;Taping    Consulted and Agree with Plan of Care Patient           Patient will benefit from skilled therapeutic intervention in order to improve the following deficits and impairments:  Decreased balance, Decreased endurance, Decreased mobility, Decreased coordination, Decreased strength, Postural dysfunction, Pain, Improper body mechanics, Decreased range of motion, Decreased knowledge of precautions, Decreased scar mobility, Hypomobility, Difficulty walking, Increased muscle spasms  Visit Diagnosis: Other idiopathic scoliosis, thoracolumbar region  Leg length discrepancy  Other abnormalities of gait and mobility  Abnormal posture     Problem List Patient Active Problem List    Diagnosis Date  Noted  . Benign prostatic hyperplasia with nocturia 10/19/2019  . Coronary artery disease 10/19/2019  . Gastroesophageal reflux disease with esophagitis 10/19/2019  . Encounter for general adult medical examination without abnormal findings 04/10/2017  . History of normocytic normochromic anemia 04/10/2017  . Vaccine counseling 03/07/2016  . Pure hypercholesterolemia 05/22/2015    Jerl Mina ,PT, DPT, E-RYT  02/03/2020, 9:01 AM  [place] MAIN [facility] SERVICES [address] [place], [place], [address] Phone: [phone]   Fax:  [phone]  Name: Cory Hoover MRN: [id number] Date of Birth: 07/19/1951

## 2020-02-03 NOTE — Patient Instructions (Addendum)
Bridging series w/ resistive band other side of doorknob:  Level 1:  Position:  Elbows bent, knees hip width apart, heels under knees   Stabilization points: shoulders, upper arms, back of head pressed into floor. Heel press downward.   Movement: inhale do nothing, exhale pull band by side, lower fists to floor completely while lifting hips.Keep stabilization points engaged when you allow the band to go back to starting position  10 x 3 reps       Level 2:  Position:  Elbows straight, arms raised to ceiling at shoulder height, knees apart like a ballerina,heels together, heels under knees  Stabilization points: shoulders, upper arms, back of head pressed into floor. Heel press downward.   Movement: inhale do nothing, exhale pull band by side, lower fists to floor completely while lifting hips. Keep stabilization points engaged when you allow the band to go back to starting position   10 x 3 reps  Shoulder training: Try to imagine you are squeezing a pencil under your armpit and your shoulder blades are down away from your ears and towards each other     __      Transition from standing to floor :  stand to floor transfer :      _ slow     _ mini squat      _ crawl down with one hand on thigh      _downward dog  - >  shoulders down and back-  walk the dog ( knee bents to lengthe hamstrings)      Floor to stand :   downward dog   crawl hands back, butt is back, knees behind toes -> squat  Hands at waist , elbows back, chest lifts

## 2020-02-10 ENCOUNTER — Ambulatory Visit: Payer: Medicare Other | Admitting: Physical Therapy

## 2020-02-10 ENCOUNTER — Other Ambulatory Visit: Payer: Self-pay

## 2020-02-10 DIAGNOSIS — R2689 Other abnormalities of gait and mobility: Secondary | ICD-10-CM

## 2020-02-10 DIAGNOSIS — M2170 Unequal limb length (acquired), unspecified site: Secondary | ICD-10-CM

## 2020-02-10 DIAGNOSIS — R293 Abnormal posture: Secondary | ICD-10-CM

## 2020-02-10 DIAGNOSIS — M4125 Other idiopathic scoliosis, thoracolumbar region: Secondary | ICD-10-CM | POA: Diagnosis not present

## 2020-02-10 NOTE — Therapy (Signed)
[place] MAIN [facility] SERVICES [address] [place], [place], [address] Phone: [phone]   Fax:  [phone]  Physical Therapy Treatment  Patient Details  Name: Cory Hoover MRN: [id number] Date of Birth: [birth year] Referring Provider (PT): Sninisky    Encounter Date: 02/10/2020   PT End of Session - 02/10/20 0817    Visit Number 8    Number of Visits 10    Date for PT Re-Evaluation 03/02/20    PT Start Time 0800    PT Stop Time 0905    PT Time Calculation (min) 65 min    Activity Tolerance Patient tolerated treatment well           Past Medical History:  Diagnosis Date  . Anxiety   . Coronary artery disease   . GERD (gastroesophageal reflux disease)   . High cholesterol   . History of impacted cerumen     Past Surgical History:  Procedure Laterality Date  . HEEL SPUR SURGERY Left   . VASECTOMY      There were no vitals filed for this visit.   Subjective Assessment - 02/10/20 0812    Subjective Pt still has nocturia 0-3x night.    Pertinent History nocturia is 2 x  night and has not been screened for a sleep study.  His wife tells him he snores for the past 15 years. Hx of CAD with catherization 10-15 years. Hx of anxiety but no panic attacks since 1990s. Heel spur surgery(Left)  2009. Bowel movements require Metamucil for the past 2 years.              [facility] PT Assessment - 02/10/20 0833      AROM   Overall AROM Comments OKC: DF 0 deg B ( post Tx: ~ 5deg) , CKC: 80 deg L, 60 deg R ( Post Tx: L 100 deg, R 75 deg )       Palpation   Palpation comment hypomobile midfoot joints, talocrural  and tightness at anterior/ lateral leg mm attachments at tibia plateu    increased scar restriction at medial R mid/hindfoot                         [facility] Adult PT Treatment/Exercise - 02/10/20 0909      Therapeutic Activites    Other Therapeutic Activities explained lower kinetic chain relationship to pelvic  floor and past Hx of R Plantar Fascia surgery impacting       Neuro Re-ed    Neuro Re-ed Details  cued for DF in OKC/ CKC       Manual Therapy   Manual therapy comments AP/PA mob at midfoot joints, distraction at talocrural joints, STM/ MWM at intrinsic feet to promote DF                          PT Long Term Goals - 02/10/20 0814      PT LONG TERM GOAL #1   Title Pt will report being able to sit for long drive 2 hrs, taking full stride  on the tennis court during matches, sitting for a movie for 2hrs without glut/ leg pain L.  ( 7/15: 60% improvement)    Time 10    Period Weeks    Status Partially Met      PT LONG TERM GOAL #2   Title Pt will report decreased FOTO score for Pain ( 13  pts) and PFDI Urinary (13 pts) to <6 pts in order to restore function and participate in commmunity events    Time 8    Period Weeks    Status On-going      PT LONG TERM GOAL #3   Title Pt will demo equal iliac crest / shoulder alignment, less spinal deviations, more arm swing and reciprocal movement of thorax and pelvis in gait in order to progress to deep core exercsies and improve IAP system    Time 4    Period Weeks    Status Achieved      PT LONG TERM GOAL #4   Title Pt will be IND with relaxation practices and flexibility program to compliment his tennis, aerobic, and weight lifting routine    Time 6    Period Weeks    Status On-going      PT LONG TERM GOAL #5   Title Pt will demo proper body mechanics and modifications to fitness routine to minimize straining pelvic floor mm to minimize urinary frequency , improve complete emptying of urine    Time 5    Period Weeks    Status On-going      PT LONG TERM GOAL #6   Title Pt will be follow up with sleep study to screen for OSA to see if nocturia is related with OSA given pt with risk factors    Time 10    Period Weeks    Status Achieved      PT LONG TERM GOAL #7   Title Pt will increase L knee flex to > 4+/5, PF with  single UE on wall on L from 3-/5 to 4/5 to > 20 reps , R PF 4/5 in order to play tennis for longer periods and minimize risk for hamstring injuries    Baseline L  knee flex 3+/5, R 4+/5,  PF with single UE R 26 reps, 3+/5,  L 14 reps 3-/5  ( 7/15:  R 38 reps   , L 26 reps MMT 3/5,  knee flex 5/5 B )    Time 8    Period Weeks    Status On-going      PT LONG TERM GOAL #8   Title Pt will report being able to dismount off his bike with R leg over the sit and clear the seat and not fall in order to continue to bike safely and not injure other body parts .    Time 8    Period Weeks    Status On-going                 Plan - 02/10/20 0817    Clinical Impression Statement Pt required regional interdependent approach to address past Hx of R plantar fascia surgery, L glut pain, decreased PF strength, decreased DF AROM in OKC and CKC positions. Anticipate increased DF achieved will help to with minimizing L glut/ hamstring pain and tight pelvic floor mm and increase PF strength. Pt's R LE length being short attributed to these deficits and his shoe lift will help yield long lasting effects in addition to  Tx and HEP.  Pt continues to benefit from skilled PT.    Personal Factors and Comorbidities Age;Fitness    Examination-Activity Limitations Toileting;Stand;Lift    Stability/Clinical Decision Making Evolving/Moderate complexity    Rehab Potential Good    PT Frequency 1x / week    PT Duration Other (comment)   10   PT Treatment/Interventions Moist Heat;Gait training;Stair  training;Functional mobility training;Neuromuscular re-education;Balance training;Therapeutic exercise;Therapeutic activities;Patient/family education;Manual techniques;Scar mobilization;Taping    Consulted and Agree with Plan of Care Patient           Patient will benefit from skilled therapeutic intervention in order to improve the following deficits and impairments:  Decreased balance, Decreased endurance, Decreased  mobility, Decreased coordination, Decreased strength, Postural dysfunction, Pain, Improper body mechanics, Decreased range of motion, Decreased knowledge of precautions, Decreased scar mobility, Hypomobility, Difficulty walking, Increased muscle spasms  Visit Diagnosis: Other idiopathic scoliosis, thoracolumbar region  Leg length discrepancy  Other abnormalities of gait and mobility  Abnormal posture     Problem List Patient Active Problem List   Diagnosis Date Noted  . Benign prostatic hyperplasia with nocturia 10/19/2019  . Coronary artery disease 10/19/2019  . Gastroesophageal reflux disease with esophagitis 10/19/2019  . Encounter for general adult medical examination without abnormal findings 04/10/2017  . History of normocytic normochromic anemia 04/10/2017  . Vaccine counseling 03/07/2016  . Pure hypercholesterolemia 05/22/2015    Jerl Mina ,PT, DPT, E-RYT  02/10/2020, 9:48 AM  [place] MAIN [facility] SERVICES [address] [place], [place], [address] Phone: [phone]   Fax:  [phone]  Name: Cory Hoover MRN: [id number] Date of Birth: 02-Nov-1950

## 2020-02-10 NOTE — Patient Instructions (Signed)
Taping will stay on 2-3 showers   __    Feet slides :   Points of contact at sitting bones  Four points of contact of foot, Heel up, ankle not twist out Lower heel Four points of contact of foot, Slide foot back   Repeated with other foot   2 min    WITH SITTING POSTION with anterior tilt of pelvis   ___  [place] stance with back ankle stretch, bent knees back and forth - stretch pre and post Tennis and sitting   ___  Sitting position on sitting bones and shin bone perpendicular to floor

## 2020-02-17 ENCOUNTER — Encounter: Payer: Medicare Other | Admitting: Physical Therapy

## 2020-02-24 ENCOUNTER — Ambulatory Visit: Payer: Medicare Other | Admitting: Physical Therapy

## 2020-02-24 ENCOUNTER — Other Ambulatory Visit: Payer: Self-pay

## 2020-02-24 DIAGNOSIS — R293 Abnormal posture: Secondary | ICD-10-CM

## 2020-02-24 DIAGNOSIS — R2689 Other abnormalities of gait and mobility: Secondary | ICD-10-CM

## 2020-02-24 DIAGNOSIS — M2170 Unequal limb length (acquired), unspecified site: Secondary | ICD-10-CM

## 2020-02-24 DIAGNOSIS — M4125 Other idiopathic scoliosis, thoracolumbar region: Secondary | ICD-10-CM

## 2020-02-24 NOTE — Patient Instructions (Addendum)
Balance   Single standing, hand on wall Eyes ahead, Knees slightly bent   30 sec , x 3 x day  ___   sidelying quad stretch  20 reps    Opening knee up to sky, heels press together  20 reps
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
## 2020-03-02 ENCOUNTER — Ambulatory Visit: Payer: Medicare Other | Attending: Urology | Admitting: Physical Therapy

## 2020-03-02 ENCOUNTER — Other Ambulatory Visit: Payer: Self-pay

## 2020-03-02 DIAGNOSIS — M4125 Other idiopathic scoliosis, thoracolumbar region: Secondary | ICD-10-CM | POA: Diagnosis present

## 2020-03-02 DIAGNOSIS — M2170 Unequal limb length (acquired), unspecified site: Secondary | ICD-10-CM | POA: Diagnosis present

## 2020-03-02 DIAGNOSIS — R293 Abnormal posture: Secondary | ICD-10-CM

## 2020-03-02 DIAGNOSIS — R2689 Other abnormalities of gait and mobility: Secondary | ICD-10-CM | POA: Diagnosis present

## 2020-03-02 NOTE — Patient Instructions (Signed)
L  Hamstring strengthening  Blue band under table, seated halfway on the chair, heel back pulling ,  push down with hands/ forearms on the arm chairs  20 reps   4 -8 weeks   ___  When doing clam shells,  do not lift knee too high and let hips rock back  headlights of the L hip shine forward    ___  practice with hands on back of chair to simulate dismounting off bike with R leg as the supporting leg and lifting L knee/ leg off and rotating to the L with spine  10 reps

## 2020-03-02 NOTE — Therapy (Signed)
[place] MAIN [facility] SERVICES [address] [place], [place], [address] Phone: [phone]   Fax:  [phone]  Physical Therapy Treatment / D/C summary   Patient Details  Name: Cory Hoover MRN: [id number] Date of Birth: Aug 16, 1950 Referring Provider (PT): Sninisky    Encounter Date: 03/02/2020   PT End of Session - 03/02/20 1111    Visit Number 10    Number of Visits    Date for PT Re-Evaluation    PT Start Time 1103    PT Stop Time 1203    PT Time Calculation (min) 60 min    Activity Tolerance Patient tolerated treatment well           Past Medical History:  Diagnosis Date  . Anxiety   . Coronary artery disease   . GERD (gastroesophageal reflux disease)   . High cholesterol   . History of impacted cerumen     Past Surgical History:  Procedure Laterality Date  . HEEL SPUR SURGERY Left   . VASECTOMY      There were no vitals filed for this visit.   Subjective Assessment - 03/02/20 1108    Subjective Pt is more confident with going out in the community and not need to know where the toilet is in public places. Pt states he is more limber/ flexible on the tennis court and being able to get to the    Pertinent History nocturia is 2 x  night and has not been screened for a sleep study.  His wife tells him he snores for the past 15 years. Hx of CAD with catherization 10-15 years. Hx of anxiety but no panic attacks since 1990s. Heel spur surgery(Left)  2009. Bowel movements require Metamucil for the past 2 years.              [facility] PT Assessment - 03/02/20 1202      Observation/Other Assessments   Observations upright posture       Functional Tests   Functional tests --   clam shells with improper form     Other:   Other/ Comments simulated dismounting off bike: using L leg: limited trunk rotation       Strength   Overall Strength Comments R knee flex 4-/5, L 5/5                          [facility] Adult  PT Treatment/Exercise - 03/02/20 1210      Therapeutic Activites    Other Therapeutic Activities reassessed goals, discussed maintainence to strengthening L hamstring. hip abduction,  safe modification to dismounting  bike with L leg using R leg which is stronger to minimize falls       Neuro Re-ed    Neuro Re-ed Details  cude for proper technique for new hamstring strengthening, clam shell exercise , rotation of  SIJ/ trunk for dismounting bike with L leg                        PT Long Term Goals - 03/02/20 1112      PT LONG TERM GOAL #1   Title Pt will report being able to sit for long drive 2 hrs, taking full stride on the tennis court during matches, sitting for a movie for 2hrs without glut/ leg pain L. ( 7/15: 60% improvement, 8/5: 100%)    Time 10    Period Weeks  Status Achieved      PT LONG TERM GOAL #2   Title Pt will report decreased FOTO score for Pain ( 13 pts) and PFDI Urinary (13 pts) to <6 pts in order to restore function and participate in commmunity events  ( 8/5: 0 pt)    Time 8    Period Weeks    Status Achieved      PT LONG TERM GOAL #3   Title Pt will demo equal iliac crest / shoulder alignment, less spinal deviations, more arm swing and reciprocal movement of thorax and pelvis in gait in order to progress to deep core exercsies and improve IAP system    Time 4    Period Weeks    Status Achieved      PT LONG TERM GOAL #4   Title Pt will be IND with relaxation practices and flexibility program to compliment his tennis, aerobic, and weight lifting routine    Time 6    Period Weeks    Status Achieved      PT LONG TERM GOAL #5   Title Pt will demo proper body mechanics and modifications to fitness routine to minimize straining pelvic floor mm to minimize urinary frequency , improve complete emptying of urine    Time 5    Period Weeks    Status Achieved      PT LONG TERM GOAL #6   Title Pt will be follow up with sleep study to screen for OSA  to see if nocturia is related with OSA given pt with risk factors    Time 10    Period Weeks    Status Achieved      PT LONG TERM GOAL #7   Title Pt will increase L knee flex to > 4+/5, PF with single UE on wall on L from 3-/5 to 4/5 to > 20 reps , R PF 4/5 in order to play tennis for longer periods and minimize risk for hamstring injuries    Baseline L  knee flex 3+/5, R 4+/5,  PF with single UE R 26 reps, 3+/5,  L 14 reps 3-/5  ( 7/15:  R 38 reps   , L 26 reps MMT 3/5,  knee flex 5/5 B )  ( 7/29: L knee flex 4-/5, R 5/5.  PF L 25 reps, R 30 reps) ( 8/5:  L  MMT 3/5 26 rep, L  R 4/5 23 reps, L knee flex 4-/5  )    Time 8    Period Weeks    Status Achieved      PT LONG TERM GOAL #8   Title Pt will report being able to dismount off his bike with R leg over the sit and clear the seat and not fall in order to continue to bike safely and not injure other body parts .    Baseline 8/5: discussed using L leg as the dismounting leg because R leg is stronger as the supporting leg    Time 8    Period Weeks    Status Revised - achieved after practice                  Plan - 03/02/20 1111    Clinical Impression Statement Pt reported based on the GROC scale, his Sx have improved a "Very Great Deal Better" since [facility].   L scrotal pain and urinary urgency resolved.  Pt is more confident with going out in the community and not need to know  where the toilet is in public places.    L hamstring/glut pain has 80% improvement. Pt states he is more limber/ flexible on the tennis court and being able to get to the ball quicker.   Urinary frequency improved with decreased 13 x /day to 7 x day with 50% improvement.  Night time voiding improved from 2-3 x to 1-2 x / night. Sleep study test r/o OSA.    Pt achieved 100% of his goals. Pt demo'd  No more pelvic and spinal misalignment, gait deviations, slumped posture. Pt's upright posture, deep core coordination, hip strength and flexibility have helped pt  achieve his goals. Pt was educated on dismounting his bike using his R leg as supporting leg because it is stronger than L to minimize falls. Pt was educated on hamstring and hip strengthening exercises to continue strengthening L LE.  Pt is ready for d/c.      Personal Factors and Comorbidities Age;Fitness    Examination-Activity Limitations Toileting;Stand;Lift    Stability/Clinical Decision Making Evolving/Moderate complexity    Rehab Potential Good    PT Frequency 1x / week    PT Duration Other (comment)   10   PT Treatment/Interventions Moist Heat;Gait training;Stair training;Functional mobility training;Neuromuscular re-education;Balance training;Therapeutic exercise;Therapeutic activities;Patient/family education;Manual techniques;Scar mobilization;Taping    Consulted and Agree with Plan of Care Patient           Patient will benefit from skilled therapeutic intervention in order to improve the following deficits and impairments:  Decreased balance, Decreased endurance, Decreased mobility, Decreased coordination, Decreased strength, Postural dysfunction, Pain, Improper body mechanics, Decreased range of motion, Decreased knowledge of precautions, Decreased scar mobility, Hypomobility, Difficulty walking, Increased muscle spasms  Visit Diagnosis: Other idiopathic scoliosis, thoracolumbar region  Leg length discrepancy  Other abnormalities of gait and mobility  Abnormal posture     Problem List Patient Active Problem List   Diagnosis Date Noted  . Benign prostatic hyperplasia with nocturia 10/19/2019  . Coronary artery disease 10/19/2019  . Gastroesophageal reflux disease with esophagitis 10/19/2019  . Encounter for general adult medical examination without abnormal findings 04/10/2017  . History of normocytic normochromic anemia 04/10/2017  . Vaccine counseling 03/07/2016  . Pure hypercholesterolemia 05/22/2015    Jerl Mina ,PT, DPT, E-RYT  03/02/2020, 12:12  PM  [place] [address] [place], [place], [address] Phone: [phone]   Fax:  [phone]  Name: Cory Hoover MRN: [id number] Date of Birth: 1950/09/17

## 2020-03-09 ENCOUNTER — Ambulatory Visit: Payer: Medicare Other | Admitting: Physical Therapy

## 2020-03-15 ENCOUNTER — Encounter: Payer: Medicare Other | Admitting: Physical Therapy

## 2020-03-23 ENCOUNTER — Encounter: Payer: Medicare Other | Admitting: Physical Therapy

## 2020-03-30 ENCOUNTER — Encounter: Payer: Medicare Other | Admitting: Physical Therapy

## 2020-06-10 ENCOUNTER — Observation Stay
Admission: EM | Admit: 2020-06-10 | Discharge: 2020-06-10 | Disposition: A | Discharge status: Home / Self Care | Payer: Medicare Other | Attending: Internal Medicine | Admitting: Internal Medicine

## 2020-06-10 ENCOUNTER — Observation Stay
Admit: 2020-06-10 | Discharge: 2020-06-10 | Disposition: A | Discharge status: Home / Self Care | Payer: Medicare Other | Attending: Internal Medicine | Admitting: Internal Medicine

## 2020-06-10 ENCOUNTER — Encounter: Payer: Self-pay | Admitting: Emergency Medicine

## 2020-06-10 ENCOUNTER — Other Ambulatory Visit: Payer: Self-pay

## 2020-06-10 ENCOUNTER — Observation Stay: Payer: Medicare Other

## 2020-06-10 ENCOUNTER — Emergency Department: Payer: Medicare Other

## 2020-06-10 DIAGNOSIS — I2510 Atherosclerotic heart disease of native coronary artery without angina pectoris: Secondary | ICD-10-CM | POA: Insufficient documentation

## 2020-06-10 DIAGNOSIS — E7800 Pure hypercholesterolemia, unspecified: Secondary | ICD-10-CM | POA: Diagnosis present

## 2020-06-10 DIAGNOSIS — R202 Paresthesia of skin: Secondary | ICD-10-CM | POA: Diagnosis not present

## 2020-06-10 DIAGNOSIS — N400 Enlarged prostate without lower urinary tract symptoms: Secondary | ICD-10-CM | POA: Diagnosis not present

## 2020-06-10 DIAGNOSIS — G459 Transient cerebral ischemic attack, unspecified: Secondary | ICD-10-CM

## 2020-06-10 DIAGNOSIS — R29818 Other symptoms and signs involving the nervous system: Secondary | ICD-10-CM

## 2020-06-10 DIAGNOSIS — I10 Essential (primary) hypertension: Secondary | ICD-10-CM | POA: Diagnosis not present

## 2020-06-10 DIAGNOSIS — Z7982 Long term (current) use of aspirin: Secondary | ICD-10-CM | POA: Insufficient documentation

## 2020-06-10 DIAGNOSIS — R200 Anesthesia of skin: Secondary | ICD-10-CM

## 2020-06-10 DIAGNOSIS — I639 Cerebral infarction, unspecified: Secondary | ICD-10-CM

## 2020-06-10 DIAGNOSIS — Z20822 Contact with and (suspected) exposure to covid-19: Secondary | ICD-10-CM | POA: Diagnosis not present

## 2020-06-10 DIAGNOSIS — Z79899 Other long term (current) drug therapy: Secondary | ICD-10-CM | POA: Insufficient documentation

## 2020-06-10 LAB — HIV ANTIBODY (ROUTINE TESTING W REFLEX): HIV Screen 4th Generation wRfx: NONREACTIVE

## 2020-06-10 LAB — URINALYSIS, ROUTINE W REFLEX MICROSCOPIC
Bilirubin Urine: NEGATIVE
Glucose, UA: NEGATIVE mg/dL
Hgb urine dipstick: NEGATIVE
Ketones, ur: NEGATIVE mg/dL
Leukocytes,Ua: NEGATIVE
Nitrite: NEGATIVE
Protein, ur: NEGATIVE mg/dL
Specific Gravity, Urine: 1.006 (ref 1.005–1.030)
pH: 5 (ref 5.0–8.0)

## 2020-06-10 LAB — CBC WITH DIFFERENTIAL/PLATELET
Abs Immature Granulocytes: 0.02 10*3/uL (ref 0.00–0.07)
Basophils Absolute: 0 10*3/uL (ref 0.0–0.1)
Basophils Relative: 1 %
Eosinophils Absolute: 0.3 10*3/uL (ref 0.0–0.5)
Eosinophils Relative: 6 %
HCT: 37 % — ABNORMAL LOW (ref 39.0–52.0)
Hemoglobin: 12.9 g/dL — ABNORMAL LOW (ref 13.0–17.0)
Immature Granulocytes: 0 %
Lymphocytes Relative: 27 %
Lymphs Abs: 1.6 10*3/uL (ref 0.7–4.0)
MCH: 30.6 pg (ref 26.0–34.0)
MCHC: 34.9 g/dL (ref 30.0–36.0)
MCV: 87.9 fL (ref 80.0–100.0)
Monocytes Absolute: 0.5 10*3/uL (ref 0.1–1.0)
Monocytes Relative: 8 %
Neutro Abs: 3.5 10*3/uL (ref 1.7–7.7)
Neutrophils Relative %: 58 %
Platelets: 214 10*3/uL (ref 150–400)
RBC: 4.21 MIL/uL — ABNORMAL LOW (ref 4.22–5.81)
RDW: 12.1 % (ref 11.5–15.5)
WBC: 6 10*3/uL (ref 4.0–10.5)
nRBC: 0 % (ref 0.0–0.2)

## 2020-06-10 LAB — ECHOCARDIOGRAM COMPLETE
AR max vel: 3.32 cm2
AV Peak grad: 6.2 mmHg
Ao pk vel: 1.24 m/s
Area-P 1/2: 4.1 cm2
Height: 66 in
S' Lateral: 3.56 cm
Weight: 2560 oz

## 2020-06-10 LAB — COMPREHENSIVE METABOLIC PANEL
ALT: 30 U/L (ref 0–44)
AST: 26 U/L (ref 15–41)
Albumin: 4.3 g/dL (ref 3.5–5.0)
Alkaline Phosphatase: 80 U/L (ref 38–126)
Anion gap: 12 (ref 5–15)
BUN: 15 mg/dL (ref 8–23)
CO2: 24 mmol/L (ref 22–32)
Calcium: 9.2 mg/dL (ref 8.9–10.3)
Chloride: 102 mmol/L (ref 98–111)
Creatinine, Ser: 1 mg/dL (ref 0.61–1.24)
GFR, Estimated: >60 mL/min (ref >60)
Glucose, Bld: 108 mg/dL — ABNORMAL HIGH (ref 70–99)
Potassium: 3.8 mmol/L (ref 3.5–5.1)
Sodium: 138 mmol/L (ref 135–145)
Total Bilirubin: 0.9 mg/dL (ref 0.3–1.2)
Total Protein: 7.5 g/dL (ref 6.5–8.1)

## 2020-06-10 LAB — LIPID PANEL
Cholesterol: 131 mg/dL (ref 0–200)
HDL: 54 mg/dL (ref >40)
LDL Cholesterol: 64 mg/dL (ref 0–99)
Total CHOL/HDL Ratio: 2.4 RATIO
Triglycerides: 66 mg/dL (ref <150)
VLDL: 13 mg/dL (ref 0–40)

## 2020-06-10 LAB — CBG MONITORING, ED: Glucose-Capillary: 106 mg/dL — ABNORMAL HIGH (ref 70–99)

## 2020-06-10 LAB — URINE DRUG SCREEN, QUALITATIVE (ARMC ONLY)
Amphetamines, Ur Screen: NOT DETECTED
Barbiturates, Ur Screen: NOT DETECTED
Benzodiazepine, Ur Scrn: NOT DETECTED
Cannabinoid 50 Ng, Ur ~~LOC~~: NOT DETECTED
Cocaine Metabolite,Ur ~~LOC~~: NOT DETECTED
MDMA (Ecstasy)Ur Screen: NOT DETECTED
Methadone Scn, Ur: NOT DETECTED
Opiate, Ur Screen: NOT DETECTED
Phencyclidine (PCP) Ur S: NOT DETECTED
Tricyclic, Ur Screen: NOT DETECTED

## 2020-06-10 LAB — RESPIRATORY PANEL BY RT PCR (FLU A&B, COVID)
Influenza A by PCR: NEGATIVE
Influenza B by PCR: NEGATIVE
SARS Coronavirus 2 by RT PCR: NEGATIVE

## 2020-06-10 LAB — PROTIME-INR
INR: 1 (ref 0.8–1.2)
Prothrombin Time: 13.1 seconds (ref 11.4–15.2)

## 2020-06-10 LAB — TROPONIN I (HIGH SENSITIVITY)
Troponin I (High Sensitivity): 4 ng/L (ref <18)
Troponin I (High Sensitivity): 3 ng/L (ref <18)

## 2020-06-10 LAB — HEMOGLOBIN A1C
Hgb A1c MFr Bld: 5.4 % (ref 4.8–5.6)
Mean Plasma Glucose: 108.28 mg/dL

## 2020-06-10 LAB — ETHANOL: Alcohol, Ethyl (B): <10 mg/dL (ref <10)

## 2020-06-10 MED ORDER — ACETAMINOPHEN 160 MG/5ML PO SOLN
650.0000 mg | ORAL | Status: DC | PRN
  Filled 2020-06-10: qty 20.3

## 2020-06-10 MED ORDER — PANTOPRAZOLE SODIUM 20 MG PO TBEC
20.0000 mg | DELAYED_RELEASE_TABLET | Freq: Every day | ORAL | Status: DC
  Administered 2020-06-10: 20 mg via ORAL
  Filled 2020-06-10: qty 1

## 2020-06-10 MED ORDER — AMLODIPINE BESYLATE 2.5 MG PO TABS: 2.5000 mg | ORAL_TABLET | Freq: Every day | ORAL | 0 refills | Status: DC

## 2020-06-10 MED ORDER — ACETAMINOPHEN 325 MG PO TABS: 650.0000 mg | ORAL_TABLET | ORAL | Status: DC | PRN

## 2020-06-10 MED ORDER — ASPIRIN EC 81 MG PO TBEC
81.0000 mg | DELAYED_RELEASE_TABLET | Freq: Every day | ORAL | Status: DC
  Administered 2020-06-10: 81 mg via ORAL
  Filled 2020-06-10: qty 1

## 2020-06-10 MED ORDER — CLOPIDOGREL BISULFATE 75 MG PO TABS
75.0000 mg | ORAL_TABLET | Freq: Every day | ORAL | Status: DC
  Administered 2020-06-10: 75 mg via ORAL
  Filled 2020-06-10: qty 1

## 2020-06-10 MED ORDER — TAMSULOSIN HCL 0.4 MG PO CAPS
0.8000 mg | ORAL_CAPSULE | Freq: Every day | ORAL | Status: DC
  Filled 2020-06-10: qty 2

## 2020-06-10 MED ORDER — ATORVASTATIN CALCIUM 20 MG PO TABS: 80.0000 mg | ORAL_TABLET | Freq: Every day | ORAL | Status: DC

## 2020-06-10 MED ORDER — ENOXAPARIN SODIUM 40 MG/0.4ML ~~LOC~~ SOLN
40.0000 mg | SUBCUTANEOUS | Status: DC
  Administered 2020-06-10: 40 mg via SUBCUTANEOUS
  Filled 2020-06-10: qty 0.4

## 2020-06-10 MED ORDER — CLOPIDOGREL BISULFATE 75 MG PO TABS: 75.0000 mg | ORAL_TABLET | Freq: Every day | ORAL | 0 refills | Status: AC

## 2020-06-10 MED ORDER — CLOPIDOGREL BISULFATE 75 MG PO TABS
300.0000 mg | ORAL_TABLET | Freq: Once | ORAL | Status: AC
  Administered 2020-06-10: 300 mg via ORAL
  Filled 2020-06-10: qty 4

## 2020-06-10 MED ORDER — ASPIRIN 81 MG PO CHEW
324.0000 mg | CHEWABLE_TABLET | Freq: Once | ORAL | Status: AC
  Administered 2020-06-10: 324 mg via ORAL
  Filled 2020-06-10: qty 4

## 2020-06-10 MED ORDER — ACETAMINOPHEN 650 MG RE SUPP: 650.0000 mg | RECTAL | Status: DC | PRN

## 2020-06-10 MED ORDER — STROKE: EARLY STAGES OF RECOVERY BOOK: Freq: Once | Status: DC

## 2020-06-10 NOTE — Progress Notes (Signed)
°   06/10/20 0250  Clinical Encounter Type  Visited With Patient and family together  Visit Type Initial  Referral From Nurse  Consult/Referral To Chaplain  Chaplain responded to a Code Stroke PG. When chaplain arrived at the room, Pt was talking to the virtual time. When they finished talking to Pt and wife, Pt's wife asked where the bathroom was and chaplain walked with Mrs. Amspacher to the bathroom. When they came back to the room, the ER doctor was talking to Pt. Dr asked chaplain if she needed to talk to them. Chaplain explained she was there for support and to make sure Pt was not alone. Pt's wife said when she saw the chaplain she nervous and chaplain reiterated that she was only there for support. Chaplain encouraged wife to keep Pt smiling and in good spirits and she left.

## 2020-06-10 NOTE — Evaluation (Addendum)
Physical Therapy Evaluation Patient Details Name: Cory Hoover MRN: [id number] DOB: 31-Oct-1950 Today's Date: 06/10/2020   History of Present Illness  Pt is a 69 y/o M admitted on 06/10/20 with c/o L side numbness. CT of head demonstrates no ICH, MRI shoes no acute intracranial abnormality. PMH: CAD, GERD, HLD, anxiety.  Clinical Impression  Pt is currently independent for all mobility tasks, no LOB noted with high level gait tasks. Pt does demonstrate slight L hip weakness during gait but no deficits noted with MMT; pt reports this is chronic. At this time, no further PT needs as pt is at baseline level of function.  BLE sensation & proprioception intact. BLE heel to shin equal BLE & WNL.    Follow Up Recommendations No PT follow up    Equipment Recommendations  None recommended by PT    Recommendations for Other Services       Precautions / Restrictions Precautions Precautions: None Restrictions Weight Bearing Restrictions: No      Mobility  Bed Mobility Overal bed mobility: Independent                  Transfers Overall transfer level: Independent                  Ambulation/Gait Ambulation/Gait assistance: Independent Gait Distance (Feet): 200 Feet Assistive device: Rolling walker (2 wheeled) Gait Pattern/deviations: WFL(Within Functional Limits)     General Gait Details: pt performed head turns on command, stopping/starting & fast/slow gait without AD & without LOB; pt does present with some L hip weakness during gait but when tested pt with 5/5 strength in B hip flexors & knee extensors  Stairs            Wheelchair Mobility    Modified Rankin (Stroke Patients Only)       Balance Overall balance assessment: Independent                                           Pertinent Vitals/Pain Pain Assessment: No/denies pain    Home Living Family/patient expects to be discharged to:: Private residence Living  Arrangements: Spouse/significant other Available Help at Discharge: Family Type of Home: House Home Access: Stairs to enter   [employer] of Steps: 3 steps at garage with R rail, 8 steps with wideset B rails at front door Home Layout: Two level;Able to live on main level with bedroom/bathroom        Prior Function Level of Independence: Independent         Comments: very active, tennis player, plans to bike 12 miles this coming week     Hand Dominance        Extremity/Trunk Assessment   Upper Extremity Assessment Upper Extremity Assessment: Overall WFL for tasks assessed    Lower Extremity Assessment Lower Extremity Assessment: Overall WFL for tasks assessed    Cervical / Trunk Assessment Cervical / Trunk Assessment: Normal  Communication   Communication: No difficulties  Cognition Arousal/Alertness: Awake/alert Behavior During Therapy: WFL for tasks assessed/performed Overall Cognitive Status: Within Functional Limits for tasks assessed                                        General Comments      Exercises     Assessment/Plan  PT Assessment Patent does not need any further PT services  PT Problem List         PT Treatment Interventions      PT Goals (Current goals can be found in the Care Plan section)  Acute Rehab PT Goals Patient Stated Goal: to go home PT Goal Formulation: With patient Time For Goal Achievement: 06/24/20 Potential to Achieve Goals: Good    Frequency     Barriers to discharge        Co-evaluation               AM-PAC PT "6 Clicks" Mobility  Outcome Measure Help needed turning from your back to your side while in a flat bed without using bedrails?: None Help needed moving from lying on your back to sitting on the side of a flat bed without using bedrails?: None Help needed moving to and from a bed to a chair (including a wheelchair)?: None Help needed standing up from a chair using your  arms (e.g., wheelchair or bedside chair)?: None Help needed to walk in hospital room?: None Help needed climbing 3-5 steps with a railing? : None 6 Click Score: 24    End of Session   Activity Tolerance: Patient tolerated treatment well Patient left: in bed;with call bell/phone within reach        Time: 1159-1209 PT Time Calculation (min) (ACUTE ONLY): 10 min   Charges:   PT Evaluation $PT Eval Low Complexity: [place], PT, DPT 06/10/20, 12:25 PM   Waunita Schooner 06/10/2020, 12:22 PM

## 2020-06-10 NOTE — Progress Notes (Signed)
*  PRELIMINARY RESULTS* Echocardiogram 2D Echocardiogram has been performed.  Cory Hoover Cory Hoover 06/10/2020, 8:35 AM

## 2020-06-10 NOTE — Consult Note (Signed)
[facility] Tele[facility]urology Consult Services   Date of Service:   06/10/2020 02:57:25  Impression:     .  R20.2 - Paresthesia of skin  Comments/Sign-Out: 69 year old M with history of HTN, HLD, nonobstructive CAD who presents with L sided numbness and tingling. He went to bed at around midnight when he was last known well; at 1:30am he woke up with numbness/tingling of L side of face, hand, and leg. It has since improved somewhat. This also happened years ago and he went to the ER but did not receive a CT scan or anything. Since then, he occasionally gets the same sensation when playing tennis but it's very intermittent. No recent infectious symptoms, including fever, cough, runny nose, sore throat, vomiting, diarrhea, dysuria, or urinary frequency. BP is elevated. NIH is 1. CTH unrevealing. No TPA because symptoms are nondisabling. IMPRESSION: possible small stroke  Metrics: Last Known Well: 06/10/2020 00:00:00 [facility] Notification Time: 06/10/2020 02:57:25 Arrival Time: 06/10/2020 02:32:00 Stamp Time: 06/10/2020 02:57:25 Time First Login Attempt: 06/10/2020 02:59:00 Symptoms: L side numbness/tingling. NIHSS Start Assessment Time: 06/10/2020 03:00:00 Patient is not a candidate for Thrombolytic. Thrombolytic Medical Decision: 06/10/2020 03:05:30 Patient was not deemed candidate for Thrombolytic because of following reasons: No disabling symptoms.  CT head was reviewed.  ED Physician notified of diagnostic impression and management plan on 06/10/2020 03:17:39  Advanced Imaging: Advanced Imaging Not Recommended because:  Clinical Presentation is not Suggestive of LVO and NIHSS is <6   Our recommendations are outlined below.  Recommendations:     .  Activate Stroke Protocol Admission/Order Set     .  Stroke/Telemetry Floor     .  Neuro Checks     .  Bedside Swallow Eval     .  DVT Prophylaxis     .  IV Fluids, Normal Saline     .  Head of Bed 30  Degrees     .  Euglycemia and Avoid Hyperthermia (PRN Acetaminophen)     .  Start patient on dual antiplatelet therapy (DAPT) with Plavix (loading 300mg  x1 followed by 75mg  daily) and Aspirin (loading 325mg  x1 followed by 81mg  daily) for at least 21 days, afterwards switching to monotherapy. Would continue DAPT for total 90 days if there is significant intracranial atherosclerosis in the territory of the suspected stroke. This recommendation is based on a low NIHSS at presentation (mild stroke vs TIA) and is in general agreement with guidelines from the American Heart/American Stroke Association (AHA/ASA).     .  Noncontrast Brain MRI, if it shows Acute Ischemic Stroke then head and neck MRA and ECHO, Holter or at least 24 hrs cardiac monitoring, lipid panel and HbA1c     .  PERMISSIVE HTN: That is, for patients with ischemic stroke who are not treated with thrombolytic therapy, blood pressure should not be treated acutely unless the hypertension is extreme (systolic blood pressure >147 mmHg or diastolic blood pressure >829 mmHg), or the patient has active ischemic coronary disease, heart failure, aortic dissection, hypertensive encephalopathy, or pre-eclampsia/eclampsia. When treatment is indicated, cautious lowering of blood pressure by approximately 15 percent during the first 24 hours after stroke onset is suggested. Prefer low doses of labetalol > hydralazine > nicardipine.  Routine Consultation with [facility] for Follow up Care  Sign Out:     .  Discussed with Emergency Department Provider    ------------------------------------------------------------------------------  History of Present Illness: Patient is a 69 year old Male.  Patient was brought by private transportation with symptoms  of L side numbness/tingling.  69 year old M with history of HTN, HLD, nonobstructive CAD who presents with L sided numbness and tingling. He went to bed at around midnight when he was last known  well; at 1:30am he woke up with numbness/tingling of L side of face, hand, and leg. It has since improved somewhat. This also happened years ago and he went to the ER but did not receive a CT scan or anything. Since then, he occasionally gets the same sensation when playing tennis but it's very intermittent. No recent infectious symptoms, including fever, cough, runny nose, sore throat, vomiting, diarrhea, dysuria, or urinary frequency.    Past Medical History:     . Hypertension     . Hyperlipidemia     . Coronary Artery Disease     . There is NO history of Diabetes Mellitus     . There is NO history of Atrial Fibrillation     . There is NO history of Stroke     . GERD  Social History: Smoking: No  Family History:father had a stroke at age 70, CABGx4 at 3  Anticoagulant use:  No  Antiplatelet use: aspirin  Allergies:  Reviewed     Examination: BP(158/68), Pulse(64), Blood Glucose(106) 1A: Level of Consciousness - Alert; keenly responsive + 0 1B: Ask Month and Age - Both Questions Right + 0 1C: Blink Eyes & Squeeze Hands - Performs Both Tasks + 0 2: Test Horizontal Extraocular Movements - Normal + 0 3: Test Visual Fields - No Visual Loss + 0 4: Test Facial Palsy (Use Grimace if Obtunded) - Normal symmetry + 0 5A: Test Left Arm Motor Drift - No Drift for 10 Seconds + 0 5B: Test Right Arm Motor Drift - No Drift for 10 Seconds + 0 6A: Test Left Leg Motor Drift - No Drift for 5 Seconds + 0 6B: Test Right Leg Motor Drift - No Drift for 5 Seconds + 0 7: Test Limb Ataxia (FNF/Heel-Shin) - No Ataxia + 0 8: Test Sensation - Mild-Moderate Loss: Less Sharp/More Dull + 1 9: Test Language/Aphasia - Normal; No aphasia + 0 10: Test Dysarthria - Normal + 0 11: Test Extinction/Inattention - No abnormality + 0  NIHSS Score: 1  Pre-Morbid Modified Rankin Scale: 0 Points = No symptoms at all   Patient/Family was informed the Neurology Consult would occur via TeleHealth consult by way of  interactive audio and video telecommunications and consented to receiving care in this manner.   Patient is being evaluated for possible acute neurologic impairment and high probability of imminent or life-threatening deterioration. I spent total of 20 minutes providing care to this patient, including time for face to face visit via telemedicine, review of medical records, imaging studies and discussion of findings with providers, the patient and/or family.   Dr Ophelia Charter   [facility] [phone]  Case [id number]

## 2020-06-10 NOTE — H&P (Signed)
History and Physical    Cory Hoover DGL:[id number] DOB: 1951-02-28 DOA: 06/10/2020  PCP: Dion Body, MD   Patient coming from: Home  I have personally briefly reviewed patient's old medical records in [place]  Chief Complaint: Numbness left arm, left side of face  HPI: Cory Hoover is a 69 y.o. male with medical history significant for HLD, CAD, HTN, BPH who presents to the emergency room after he was awakened at 1:30 AM with numbness on the left side of his face and left arm.  He denied associated weakness of the extremities or the face.  Had no difficulty with speech or swallowing but did complain of feeling like a lump in his throat.  Denies visual disturbance or headache.  Wife is at bedside and also contributes to history.  Patient was previously in his usual state of health with no recent illness.  Denies fever or chills, chest pains or shortness of breath, abdominal pain, nausea vomiting. ED Course: Patient arrived to the ER within 3 hours of symptom onset, on arrival blood pressure slightly elevated at 163/80 with otherwise normal vitals.  Blood work including CBC, CMP, troponin and ethanol level all mostly unremarkable.  CT head with no acute findings EKG as reviewed by me : Normal sinus rhythm rate of 68 with no acute ST-T wave changes. Patient had a teleneurology consult with recommendations for dual antiplatelet therapy with Plavix and aspirin load, MRI, permissive hypertension and others per order set.  Hospitalist consulted for admission.   Review of Systems: As per HPI otherwise all other systems on review of systems negative.    Past Medical History:  Diagnosis Date  . Anxiety   . Coronary artery disease   . GERD (gastroesophageal reflux disease)   . High cholesterol   . History of impacted cerumen     Past Surgical History:  Procedure Laterality Date  . HEEL SPUR SURGERY Left   . VASECTOMY       reports that he has never smoked. He has  never used smokeless tobacco. He reports current alcohol use of about 7.0 standard drinks of alcohol per week. He reports that he does not use drugs.  Allergies  Allergen Reactions  . Citalopram     Other reaction(s): Other (See Comments) Sexual side effects    Family History  Problem Relation Age of Onset  . Heart failure Mother   . Heart failure Father       Prior to Admission medications   Medication Sig Start Date End Date Taking? Authorizing Provider  Acidophilus Lactobacillus CAPS Take by mouth daily.     [provider]  aspirin EC 81 MG tablet Take by mouth.    [provider]  atorvastatin (LIPITOR) 40 MG tablet Take 60 mg by mouth daily.  07/18/17   [provider]  Multiple Vitamin (MULTI-VITAMIN) tablet Take 1 tablet by mouth every other day.     [provider]  pantoprazole (PROTONIX) 20 MG tablet Take 20 mg by mouth daily.  09/02/17   [provider]  Psyllium (METAMUCIL FIBER PO) Take 1 Scoop by mouth in the morning and at bedtime.     [provider]  tamsulosin (FLOMAX) 0.4 MG CAPS capsule Take 2 capsules by mouth daily. 10/22/19   [provider]    Physical Exam: Vitals:   06/10/20 0243 06/10/20 0244 06/10/20 0300  BP: (!) 163/80  (!) 158/68  Pulse: 65  62  Resp: 18  18  Temp: 98.3 F (36.8 C)    TempSrc: Oral    SpO2: 98%  98%  Weight:  72.6 kg   Height:  5\' 6"  (1.676 m)      Vitals:   06/10/20 0243 06/10/20 0244 06/10/20 0300  BP: (!) 163/80  (!) 158/68  Pulse: 65  62  Resp: 18  18  Temp: 98.3 F (36.8 C)    TempSrc: Oral    SpO2: 98%  98%  Weight:  72.6 kg   Height:  5\' 6"  (1.676 m)       Constitutional: Alert and oriented x 3 . Not in any apparent distress HEENT:      Head: Normocephalic and atraumatic.         Eyes: PERLA, EOMI, Conjunctivae are normal. Sclera is non-icteric.       Mouth/Throat: Mucous membranes are moist.       Neck: Supple with no signs of  meningismus. Cardiovascular: Regular rate and rhythm. No murmurs, gallops, or rubs. 2+ symmetrical distal pulses are present . No JVD. No LE edema Respiratory: Respiratory effort normal .Lungs sounds clear bilaterally. No wheezes, crackles, or rhonchi.  Gastrointestinal: Soft, non tender, and non distended with positive bowel sounds. No rebound or guarding. Genitourinary: No CVA tenderness. Musculoskeletal: Nontender with normal range of motion in all extremities. No cyanosis, or erythema of extremities. Neurologic:  Face is symmetric. Moving all extremities. No gross focal neurologic deficits . Skin: Skin is warm, dry.  No rash or ulcers Psychiatric: Mood and affect are normal    Labs on Admission: I have personally reviewed following labs and imaging studies  CBC: Recent Labs  Lab 06/10/20 0249  WBC 6.0  NEUTROABS 3.5  HGB 12.9*  HCT 37.0*  MCV 87.9  PLT 992   Basic Metabolic Panel: Recent Labs  Lab 06/10/20 0249  NA 138  K 3.8  CL 102  CO2 24  GLUCOSE 108*  BUN 15  CREATININE 1.00  CALCIUM 9.2   GFR: Estimated Creatinine Clearance: 62.9 mL/min (by C-G formula based on SCr of 1 mg/dL). Liver Function Tests: Recent Labs  Lab 06/10/20 0249  AST 26  ALT 30  ALKPHOS 80  BILITOT 0.9  PROT 7.5  ALBUMIN 4.3   No results for input(s): LIPASE, AMYLASE in the last 168 hours. No results for input(s): AMMONIA in the last 168 hours. Coagulation Profile: Recent Labs  Lab 06/10/20 0249  INR 1.0   Cardiac Enzymes: No results for input(s): CKTOTAL, CKMB, CKMBINDEX, TROPONINI in the last 168 hours. BNP (last 3 results) No results for input(s): PROBNP in the last 8760 hours. HbA1C: No results for input(s): HGBA1C in the last 72 hours. CBG: Recent Labs  Lab 06/10/20 0244  GLUCAP 106*   Lipid Profile: No results for input(s): CHOL, HDL, LDLCALC, TRIG, CHOLHDL, LDLDIRECT in the last 72 hours. Thyroid Function Tests: No results for input(s): TSH, T4TOTAL, FREET4,  T3FREE, THYROIDAB in the last 72 hours. Anemia Panel: No results for input(s): VITAMINB12, FOLATE, FERRITIN, TIBC, IRON, RETICCTPCT in the last 72 hours. Urine analysis: No results found for: COLORURINE, APPEARANCEUR, LABSPEC, [place], GLUCOSEU, [place], BILIRUBINUR, [place], PROTEINUR, UROBILINOGEN, NITRITE, LEUKOCYTESUR  Radiological Exams on Admission: DG Chest Port 1 View  Result Date: 06/10/2020 CLINICAL DATA:  Code stroke EXAM: PORTABLE CHEST 1 VIEW COMPARISON:  12/25/2009 FINDINGS: The heart size and mediastinal contours are within normal limits. Both lungs are clear. The visualized skeletal structures are unremarkable. IMPRESSION: No active disease. Electronically Signed   By: Lennette Bihari  [place] M.D.   On: 06/10/2020 03:43   CT HEAD CODE STROKE WO CONTRAST  Result Date: 06/10/2020 CLINICAL DATA:  Code stroke.  Left facial numbness EXAM: CT HEAD WITHOUT CONTRAST TECHNIQUE: Contiguous axial images were obtained from the base of the skull through the vertex without intravenous contrast. COMPARISON:  None. FINDINGS: Brain: There is no mass, hemorrhage or extra-axial collection. The size and configuration of the ventricles and extra-axial CSF spaces are normal. The brain parenchyma is normal, without evidence of acute or chronic infarction. Vascular: No abnormal hyperdensity of the major intracranial arteries or dural venous sinuses. No intracranial atherosclerosis. Skull: The visualized skull base, calvarium and extracranial soft tissues are normal. Sinuses/Orbits: No fluid levels or advanced mucosal thickening of the visualized paranasal sinuses. No mastoid or middle ear effusion. The orbits are normal. ASPECTS [facility] Stroke Program Early CT Score) - Ganglionic level infarction (caudate, lentiform nuclei, internal capsule, insula, M1-M3 cortex): 7 - Supraganglionic infarction (M4-M6 cortex): 3 Total score (0-10 with 10 being normal): 10 IMPRESSION: 1. Normal head CT. 2. ASPECTS is 10. These results  were called by telephone at the time of interpretation on 06/10/2020 at 3:00 am to provider [place] , who verbally acknowledged these results. Electronically Signed   By: Ulyses Jarred M.D.   On: 06/10/2020 03:00     Assessment/Plan 69 year old male with history of HLD, CAD, HTN, BPH presenting to the ER within 3 hours of numbness on the left side of his face and left arm.      Acute focal neurological deficit, possible CVA  -Appreciate neurology consult -Patient arrived within 3 hours.  tPA not recommended given low NIHSS -Plavix 300 mg plus aspirin 325 mg x 1 given in the ER -Continue aspirin 81 daily plus Plavix 75 daily x21 days then monotherapy thereafter -"Would continue DAPT for total 90 days if there is significant intracranial atherosclerosis in the territory of the suspected stroke" -Follow-up MRI.  If it shows acute ischemic stroke then head and neck MRA -Echo, continuous cardiac monitoring -Lipid panel and A1c -Permissive hypertension -Neurology consult    Coronary artery disease -Chronic and stable.  Follows with Dr. Nehemiah Massed -Continue statins.  Antiplatelets as above    Pure hypercholesterolemia -Continue home statins    BPH (benign prostatic hyperplasia) -Chronic and stable -Continue Flomax    DVT prophylaxis: Lovenox  Code Status: full code  Family Communication:  none  Disposition Plan: Back to previous home environment Consults called: Neurology Status: Observation    Athena Masse MD [facility]     06/10/2020, 3:57 AM

## 2020-06-10 NOTE — ED Notes (Signed)
Advised nurse that patient has assigned bed 

## 2020-06-10 NOTE — Consult Note (Signed)
Neurology Consultation Reason for Consult: Left-sided numbness Referring Physician: Prudy Feeler  CC: Left-sided numbness  History is obtained from: Patient  HPI: Cory Hoover is a 69 y.o. male with a history of high cholesterol and borderline hypertension who presents with left-sided numbness that has since resolved.  He states that he woke up from sleep noticing his left arm and leg and face were numb.  His leg numbness improved relatively rapidly, his arm and face took longer, with his face just recently resolving.    He has a history of mild hypertension, previously treated but it limited his ability to play tennis and therefore he stopped taking his medication with supervision of his doctor.  Currently takes a baby aspirin daily  LKW: 11/12 prior to bed tpa given?: no, mild symptoms    ROS: An 11 point ROS was performed and is negative except as noted in the HPI.   Past Medical History:  Diagnosis Date  . Anxiety   . Coronary artery disease   . GERD (gastroesophageal reflux disease)   . High cholesterol   . History of impacted cerumen      Family History  Problem Relation Age of Onset  . Heart failure Mother   . Heart failure Father      Social History:  reports that he has never smoked. He has never used smokeless tobacco. He reports current alcohol use of about 7.0 standard drinks of alcohol per week. He reports that he does not use drugs.   Exam: Current vital signs: BP 128/62   Pulse 67   Temp 98.3 F (36.8 C) (Oral)   Resp 18   Ht 5\' 6"  (1.676 m)   Wt 72.6 kg   SpO2 97%   BMI 25.82 kg/m  Vital signs in last 24 hours: Temp:  [98.3 F (36.8 C)] 98.3 F (36.8 C) (11/13 0243) Pulse Rate:  [59-67] 67 (11/13 1100) Resp:  [14-21] 18 (11/13 1100) BP: (128-163)/(62-85) 128/62 (11/13 1100) SpO2:  [96 %-100 %] 97 % (11/13 1100) Weight:  [72.6 kg] 72.6 kg (11/13 0244)   Physical Exam  Constitutional: Appears well-developed and well-nourished.  Psych:  Affect appropriate to situation Eyes: No scleral injection HENT: No OP obstrucion MSK: no joint deformities.  Cardiovascular: Normal rate and regular rhythm.  Respiratory: Effort normal, non-labored breathing GI: Soft.  No distension. There is no tenderness.  Skin: WDI  Neuro: Mental Status: Patient is awake, alert, oriented to person, place, month, year, and situation. Patient is able to give a clear and coherent history. No signs of aphasia or neglect Cranial Nerves: II: Visual Fields are full. Pupils are equal, round, and reactive to light.   III,IV, VI: EOMI without ptosis or diploplia.  V: Facial sensation is symmetric to temperature VII: Facial movement is symmetric.  VIII: hearing is intact to voice X: Uvula elevates symmetrically XI: Shoulder shrug is symmetric. XII: tongue is midline without atrophy or fasciculations.  Motor: Tone is normal. Bulk is normal. 5/5 strength was present in all four extremities.  Sensory: Sensation is symmetric to light touch and temperature in the arms and legs. Deep Tendon Reflexes: 2+ and symmetric in the biceps and patellae.  Plantars: Toes are downgoing bilaterally.  Cerebellar: FNF and HKS are intact bilaterally   I have reviewed labs in epic and the results pertinent to this consultation are: CMP-unremarkable LDL 64  I have reviewed the images obtained: MRI brain-negative, carotid ultrasound-negative  Impression: 69 year old male with transient left-sided numbness most consistent  with TIA.  No evidence of embolic source on carotid ultrasound, and I suspect it is more likely due to a small vessel risk factors, but echo is still pending  Recommendations: 1) echocardiogram 2) aspirin 81 mg and Plavix 85 mg daily x3 weeks followed by Plavix monotherapy 3) continue current antilipid management 4) consider starting low-dose antihypertensive. 5) if echo without embolic source, no further recommendations at this time.  Roland Rack, MD [facility] [phone]  If 7pm- 7am, please page neurology on call as listed in [place].

## 2020-06-10 NOTE — Evaluation (Signed)
Occupational Therapy Evaluation Patient Details Name: Cory Hoover MRN: [id number] DOB: 1951-03-08 Today's Date: 06/10/2020    History of Present Illness Pt is a 69 y/o M admitted on 06/10/20 with c/o L side numbness. CT of head demonstrates no ICH, MRI shoes no acute intracranial abnormality. PMH: CAD, GERD, HLD, anxiety.   Clinical Impression   Cory Hoover seen for OT evaluation this date. Prior to hospital admission, pt was Independent in all aspects of ADL/IADL including biking and playing tennis. Pt lives with his spouse and reports 1 fall getting off of his bike. Currently pt reporting symptoms have resolved. Pt demonstrates baseline independence to perform ADL and mobility tasks and no strength, sensory, cognitive, or visual deficits appreciated with assessment. Mild LUE coordination deficits noted (decreased time 5 finger opposition) and pt instructed in [facility] HEP. No skilled OT needs identified. Will sign off. Please re-consult if additional OT needs arise.     Follow Up Recommendations  No OT follow up    Equipment Recommendations  None recommended by OT    Recommendations for Other Services       Precautions / Restrictions Precautions Precautions: None Restrictions Weight Bearing Restrictions: No      Mobility Bed Mobility Overal bed mobility: Independent     Transfers Overall transfer level: Independent         Balance Overall balance assessment: Independent      ADL either performed or assessed with clinical judgement   ADL Overall ADL's : Independent                      Pertinent Vitals/Pain Pain Assessment: No/denies pain     Hand Dominance Right   Extremity/Trunk Assessment Upper Extremity Assessment Upper Extremity Assessment: Overall WFL for tasks assessed (mild coordination deficts- RAM, FNF intact. Increased time 5 digit opposition)   Lower Extremity Assessment Lower Extremity Assessment: Overall WFL for tasks assessed    Cervical / Trunk Assessment Cervical / Trunk Assessment: Normal   Communication Communication Communication: No difficulties   Cognition Arousal/Alertness: Awake/alert Behavior During Therapy: WFL for tasks assessed/performed Overall Cognitive Status: Within Functional Limits for tasks assessed      General Comments       Exercises Exercises: Other exercises Other Exercises Other Exercises: Pt educated re: OT role, d/c recs, falls prevention, balance training Other Exercises: LBD, UBD, ~400 ft mobility   Shoulder Instructions      Home Living Family/patient expects to be discharged to:: Private residence Living Arrangements: Spouse/significant other Available Help at Discharge: Family Type of Home: House Home Access: Stairs to enter [employer] of Steps: 3 steps at garage with R rail, 8 steps with wideset B rails at front door   Home Layout: Two level;Able to live on main level with bedroom/bathroom Alternate Level Stairs-Number of Steps: 15 Alternate Level Stairs-Rails: Left;Right (alternating) Bathroom Shower/Tub: Walk-in shower         Home Equipment: Shower seat - built in          Prior Functioning/Environment Level of Independence: Independent        Comments: very active, tennis player, biked 12 miles last week        OT Problem List: Decreased coordination      OT Treatment/Interventions:      OT Goals(Current goals can be found in the care plan section) Acute Rehab OT Goals Patient Stated Goal: to be able to put socks on while standing  OT Goal Formulation: With patient Time For  Goal Achievement: 06/24/20 Potential to Achieve Goals: Good                AM-PAC OT "6 Clicks" Daily Activity     Outcome Measure Help from another person eating meals?: None Help from another person taking care of personal grooming?: None Help from another person toileting, which includes using toliet, bedpan, or urinal?: None Help from another  person bathing (including washing, rinsing, drying)?: None Help from another person to put on and taking off regular upper body clothing?: None Help from another person to put on and taking off regular lower body clothing?: None 6 Click Score: 24   End of Session Nurse Communication: Mobility status  Activity Tolerance: Patient tolerated treatment well Patient left: in bed;with call bell/phone within reach  OT Visit Diagnosis: Other abnormalities of gait and mobility (R26.89)                Time: 4784-1282 OT Time Calculation (min): 18 min Charges:  OT General Charges $OT Visit: 1 Visit OT Evaluation $OT Eval Low Complexity: 1 Low OT Treatments $Self Care/Home Management : 8-22 mins  Dessie Coma, M.S. OTR/L  06/10/20, 12:57 PM  ascom [phone]

## 2020-06-10 NOTE — Discharge Summary (Signed)
Discharge Summary  Cory Hoover KCL:[id number] DOB: 1950/09/09  PCP: Dion Body, MD  Admit date: 06/10/2020 Discharge date: 06/10/2020  Time spent: 30 mins   Recommendations for Outpatient Follow-up:  1. PCP in 1 week 2. Neurology follow-up in 4 weeks    Discharge Diagnoses:  Active Hospital Problems   Diagnosis Date Noted  . Acute focal neurological deficit 06/10/2020  . BPH (benign prostatic hyperplasia) 06/10/2020  . Coronary artery disease 10/19/2019  . Pure hypercholesterolemia 05/22/2015    Resolved Hospital Problems  No resolved problems to display.    Discharge Condition: Stable  Diet recommendation: Heart healthy  Vitals:   06/10/20 1100 06/10/20 1200  BP: 128/62 (!) 145/94  Pulse: 67 65  Resp: 18 18  Temp:    SpO2: 97% 98%    History of present illness:  Cory Hoover is a 69 y.o. male with medical history significant for HLD, CAD, HTN, BPH who presents to the emergency room after he was awakened at 1:30 AM with numbness on the left side of his face and left fore arm.  He denied associated weakness of the extremities or the face.  Had no difficulty with speech or swallowing but did complain of feeling like a lump in his throat.  Denies visual disturbance or headache.  Wife was at bedside and also contributed to history.  Patient was previously in his usual state of health with no recent illness.  Denies fever or chills, chest pains or shortness of breath, abdominal pain, nausea vomiting. In the ED, blood pressure slightly elevated at 163/80 with otherwise normal vitals. Blood work including CBC, CMP, troponin and ethanol level all mostly unremarkable.  CT head with no acute findings EKG, with no acute ST changes. Patient had a teleneurology consult with recommendations for dual antiplatelet therapy with Plavix and aspirin load, MRI, permissive hypertension and others per order set.  Hospitalist consulted for admission.    Today, patient denies  any new complaints, feels like his left-sided face numbness is resolving, no other focal neurologic deficits noted, denies any chest pain, shortness of breath, abdominal pain, nausea/vomiting, fever/chills.  Reports history of generalized anxiety disorder.     Hospital Course:  Principal Problem:   Acute focal neurological deficit Active Problems:   Coronary artery disease   Pure hypercholesterolemia   BPH (benign prostatic hyperplasia)   Likely TIA MRI showed no acute intracranial abnormality Bilateral carotid Doppler showed mild plaque at the level of bilateral distal common carotid artery, no evidence of visible internal carotid artery plaque or stenosis bilaterally Echo, received verbal report from Dr. Clayborn Bigness, stating echo was normal Troponin WNL, EKG with no acute ST changes LDL 64, A1c 5.4 Neurology consulted, recommend aspirin and Plavix for 3 weeks and then Plavix alone Discharge patient on aspirin, Plavix, continue home Lipitor PT/OT/SLP-no follow-up required Follow-up with neurology in 4 weeks  Hypertension Patient reports history of hypertension, but no longer on medication and has been monitored by his PCP BP somewhat uncontrolled during this admission Start p.o. amlodipine 2.5 mg daily low-dose PCP to follow-up on BP management  CAD Chest pain-free Outpatient cardiology follow-up  GERD Continue PPI  BPH Continue Flomax         Malnutrition Type:      Malnutrition Characteristics:      Nutrition Interventions:      Estimated body mass index is 25.82 kg/m as calculated from the following:   Height as of this encounter: 5\' 6"  (1.676 m).   Weight as  of this encounter: 72.6 kg.    Procedures:  None  Consultations: Neurology   Discharge Exam: BP (!) 145/94 (BP Location: Left Arm)   Pulse 65   Temp 98.3 F (36.8 C) (Oral)   Resp 18   Ht 5\' 6"  (1.676 m)   Wt 72.6 kg   SpO2 98%   BMI 25.82 kg/m   General: NAD    Cardiovascular: S1, S2 present Respiratory: CTA B Neurology: No neurologic deficit noted    Discharge Instructions You were cared for by a hospitalist during your hospital stay. If you have any questions about your discharge medications or the care you received while you were in the hospital after you are discharged, you can call the unit and asked to speak with the hospitalist on call if the hospitalist that took care of you is not available. Once you are discharged, your primary care physician will handle any further medical issues. Please note that NO REFILLS for any discharge medications will be authorized once you are discharged, as it is imperative that you return to your primary care physician (or establish a relationship with a primary care physician if you do not have one) for your aftercare needs so that they can reassess your need for medications and monitor your lab values.  Discharge Instructions    Diet - low sodium heart healthy   Complete by: As directed    Increase activity slowly   Complete by: As directed      Allergies as of 06/10/2020      Reactions   Citalopram Other (See Comments)   Reaction: Sexual side effects      Medication List    TAKE these medications   Acidophilus Lactobacillus Caps Take by mouth daily.   amLODipine 2.5 MG tablet Commonly known as: NORVASC Take 1 tablet (2.5 mg total) by mouth daily.   aspirin EC 81 MG tablet Take 81 mg by mouth daily.   atorvastatin 40 MG tablet Commonly known as: LIPITOR Take 60 mg by mouth daily.   clopidogrel 75 MG tablet Commonly known as: PLAVIX Take 1 tablet (75 mg total) by mouth daily. Start taking on: June 11, 2020   METAMUCIL FIBER PO Take 1 Scoop by mouth in the morning and at bedtime.   Multi-Vitamin tablet Take 1 tablet by mouth every other day.   pantoprazole 20 MG tablet Commonly known as: PROTONIX Take 20 mg by mouth daily.   tamsulosin 0.4 MG Caps capsule Commonly known as:  FLOMAX Take 2 capsules by mouth daily.      Allergies  Allergen Reactions  . Citalopram Other (See Comments)    Reaction: Sexual side effects    Follow-up Information    Dion Body, MD. Schedule an appointment as soon as possible for a visit in 1 week(s).   Specialty: Family Medicine Contact information: [place] [address] [phone]        [place]. Schedule an appointment as soon as possible for a visit in 4 week(s).   Contact information: [place] [phone]               The results of significant diagnostics from this hospitalization (including imaging, microbiology, ancillary and laboratory) are listed below for reference.    Significant Diagnostic Studies: MR BRAIN WO CONTRAST  Result Date: 06/10/2020 CLINICAL DATA:  Left facial numbness EXAM: MRI HEAD WITHOUT CONTRAST TECHNIQUE: Multiplanar, multiecho pulse sequences of the brain  and surrounding structures were obtained without intravenous contrast. COMPARISON:  None. FINDINGS: Brain: No acute infarct, acute hemorrhage or extra-axial collection. Multifocal hyperintense T2-weighted signal within the white matter. There is generalized atrophy without lobar predilection. No chronic microhemorrhage. Normal midline structures. Vascular: Normal flow voids. Skull and upper cervical spine: Normal marrow signal. Sinuses/Orbits: Negative. Other: None. IMPRESSION: 1. No acute intracranial abnormality. 2. Findings of chronic microvascular disease and generalized atrophy. Electronically Signed   By: Ulyses Jarred M.D.   On: 06/10/2020 05:29   US Carotid Bilateral (at [facility] and [facility] only)  Result Date: 06/10/2020 CLINICAL DATA:  Stroke symptoms, hypertension and hyperlipidemia. EXAM: BILATERAL CAROTID DUPLEX ULTRASOUND TECHNIQUE: Pearline Cables scale imaging, color Doppler and duplex ultrasound were performed of bilateral  carotid and vertebral arteries in the neck. COMPARISON:  None. FINDINGS: Criteria: Quantification of carotid stenosis is based on velocity parameters that correlate the residual internal carotid diameter with NASCET-based stenosis levels, using the diameter of the distal internal carotid lumen as the denominator for stenosis measurement. The following velocity measurements were obtained: RIGHT ICA:  89/19 cm/sec CCA:  75/64 cm/sec SYSTOLIC ICA/CCA RATIO:  0.9 ECA:  102 cm/sec LEFT ICA:  96/33 cm/sec CCA:  33/29 cm/sec SYSTOLIC ICA/CCA RATIO:  1.1 ECA:  98 cm/sec RIGHT CAROTID ARTERY: Minimal partially calcified plaque is present at the level of the distal common carotid artery and carotid bulb. There is no evidence of right ICA plaque or stenosis. RIGHT VERTEBRAL ARTERY: Antegrade flow with normal waveform and velocity. LEFT CAROTID ARTERY: Minimal partially calcified plaque at the level of the carotid bulb. No evidence of left ICA plaque or stenosis. LEFT VERTEBRAL ARTERY: Antegrade flow with normal waveform and velocity. IMPRESSION: Mild plaque at the level of bilateral distal common carotid arteries/bulbs. No evidence of visible internal carotid artery plaque or stenosis bilaterally. Electronically Signed   By: Aletta Edouard M.D.   On: 06/10/2020 09:28   DG Chest Port 1 View  Result Date: 06/10/2020 CLINICAL DATA:  Code stroke EXAM: PORTABLE CHEST 1 VIEW COMPARISON:  12/25/2009 FINDINGS: The heart size and mediastinal contours are within normal limits. Both lungs are clear. The visualized skeletal structures are unremarkable. IMPRESSION: No active disease. Electronically Signed   By: Ulyses Jarred M.D.   On: 06/10/2020 03:43   CT HEAD CODE STROKE WO CONTRAST  Result Date: 06/10/2020 CLINICAL DATA:  Code stroke.  Left facial numbness EXAM: CT HEAD WITHOUT CONTRAST TECHNIQUE: Contiguous axial images were obtained from the base of the skull through the vertex without intravenous contrast. COMPARISON:   None. FINDINGS: Brain: There is no mass, hemorrhage or extra-axial collection. The size and configuration of the ventricles and extra-axial CSF spaces are normal. The brain parenchyma is normal, without evidence of acute or chronic infarction. Vascular: No abnormal hyperdensity of the major intracranial arteries or dural venous sinuses. No intracranial atherosclerosis. Skull: The visualized skull base, calvarium and extracranial soft tissues are normal. Sinuses/Orbits: No fluid levels or advanced mucosal thickening of the visualized paranasal sinuses. No mastoid or middle ear effusion. The orbits are normal. ASPECTS [facility] Stroke Program Early CT Score) - Ganglionic level infarction (caudate, lentiform nuclei, internal capsule, insula, M1-M3 cortex): 7 - Supraganglionic infarction (M4-M6 cortex): 3 Total score (0-10 with 10 being normal): 10 IMPRESSION: 1. Normal head CT. 2. ASPECTS is 10. These results were called by telephone at the time of interpretation on 06/10/2020 at 3:00 am to provider [place] , who verbally acknowledged these results. Electronically Signed   By: Lennette Bihari  [place] M.D.   On: 06/10/2020 03:00    Microbiology: Recent Results (from the past 240 hour(s))  Respiratory Panel by RT PCR (Flu A&B, Covid) - Nasopharyngeal Swab     Status: None   Collection Time: 06/10/20  2:49 AM   Specimen: Nasopharyngeal Swab  Result Value Ref Range Status   SARS Coronavirus 2 by RT PCR NEGATIVE NEGATIVE Final    Comment: (NOTE) SARS-CoV-2 target nucleic acids are NOT DETECTED.  The SARS-CoV-2 RNA is generally detectable in upper respiratoy specimens during the acute phase of infection. The lowest concentration of SARS-CoV-2 viral copies this assay can detect is 131 copies/mL. A negative result does not preclude SARS-Cov-2 infection and should not be used as the sole basis for treatment or other patient management decisions. A negative result may occur with  improper specimen collection/handling,  submission of specimen other than nasopharyngeal swab, presence of viral mutation(s) within the areas targeted by this assay, and inadequate number of viral copies (<131 copies/mL). A negative result must be combined with clinical observations, patient history, and epidemiological information. The expected result is Negative.  Fact Sheet for Patients:  PinkCheek.be  Fact Sheet for Healthcare Providers:  GravelBags.it  This test is no t yet approved or cleared by the [place] FDA and  has been authorized for detection and/or diagnosis of SARS-CoV-2 by FDA under an Emergency Use Authorization (EUA). This EUA will remain  in effect (meaning this test can be used) for the duration of the COVID-19 declaration under Section 564(b)(1) of the Act, 21 U.S.C. section 360bbb-3(b)(1), unless the authorization is terminated or revoked sooner.     Influenza A by PCR NEGATIVE NEGATIVE Final   Influenza B by PCR NEGATIVE NEGATIVE Final    Comment: (NOTE) The Xpert Xpress SARS-CoV-2/FLU/RSV assay is intended as an aid in  the diagnosis of influenza from Nasopharyngeal swab specimens and  should not be used as a sole basis for treatment. Nasal washings and  aspirates are unacceptable for Xpert Xpress SARS-CoV-2/FLU/RSV  testing.  Fact Sheet for Patients: PinkCheek.be  Fact Sheet for Healthcare Providers: GravelBags.it  This test is not yet approved or cleared by the [place] FDA and  has been authorized for detection and/or diagnosis of SARS-CoV-2 by  FDA under an Emergency Use Authorization (EUA). This EUA will remain  in effect (meaning this test can be used) for the duration of the  Covid-19 declaration under Section 564(b)(1) of the Act, 21  U.S.C. section 360bbb-3(b)(1), unless the authorization is  terminated or revoked. Performed at [facility], [address] [place], [place] [address]      Labs: Basic Metabolic Panel: Recent Labs  Lab 06/10/20 0249  NA 138  K 3.8  CL 102  CO2 24  GLUCOSE 108*  BUN 15  CREATININE 1.00  CALCIUM 9.2   Liver Function Tests: Recent Labs  Lab 06/10/20 0249  AST 26  ALT 30  ALKPHOS 80  BILITOT 0.9  PROT 7.5  ALBUMIN 4.3   No results for input(s): LIPASE, AMYLASE in the last 168 hours. No results for input(s): AMMONIA in the last 168 hours. CBC: Recent Labs  Lab 06/10/20 0249  WBC 6.0  NEUTROABS 3.5  HGB 12.9*  HCT 37.0*  MCV 87.9  PLT 214   Cardiac Enzymes: No results for input(s): CKTOTAL, CKMB, CKMBINDEX, TROPONINI in the last 168 hours. BNP: BNP (last 3 results) No results for input(s): BNP in the last 8760 hours.  ProBNP (last 3 results) No  results for input(s): PROBNP in the last 8760 hours.  CBG: Recent Labs  Lab 06/10/20 0244  GLUCAP 106*       Signed:  Alma Friendly, MD [facility] 06/10/2020, 2:19 PM

## 2020-06-10 NOTE — ED Triage Notes (Signed)
Patient with complaint of numbness to left side of his face, left forearm and hand. Patient states that it woke him up out of his sleep about an hour ago. Patient states that he went to bed about midnight and had no numbness at that time. Patient states that he also had some numbness in his left leg initially but that has improved.

## 2020-06-10 NOTE — Discharge Instructions (Signed)
IV catheter removed intact without complication.  D/C instructions given.  All questions addressed.  Advised of follow up and RX at pharmacy.  Pt and wife verbalized understanding. Pt left ER ambulatory, per request, with steady gait, with wife.

## 2020-06-10 NOTE — ED Notes (Signed)
Patient transported to CT 

## 2020-06-10 NOTE — ED Provider Notes (Signed)
[facility] Emergency Department Provider Note   ____________________________________________   First MD Initiated Contact with Patient 06/10/20 0247     (approximate)  I have reviewed the triage vital signs and the nursing notes.   HISTORY  Chief Complaint Numbness  History obtained via patient and spouse  HPI Cory Hoover is a 69 y.o. male who presents to the ED from home with a chief complaint of numbness. Patient has a history of CAD, GERD, hyperlipidemia who went to bed at midnight. He awoke approximately 1 hour ago feeling numbness to his left face, arm and leg. Numbness to his leg has since resolved but he is still feeling numb to his face and arm. Denies facial droop, slurred speech, confusion, extremity weakness. States he has had some "digestive issues" recently and feeling a lump in his throat. Denies recent fever, cough, chest pain, shortness of breath, abdominal pain, nausea, vomiting or dizziness. No history of stroke or migraines.     Past Medical History:  Diagnosis Date  . Anxiety   . Coronary artery disease   . GERD (gastroesophageal reflux disease)   . High cholesterol   . History of impacted cerumen     Patient Active Problem List   Diagnosis Date Noted  . Acute focal neurological deficit 06/10/2020  . BPH (benign prostatic hyperplasia) 06/10/2020  . Benign prostatic hyperplasia with nocturia 10/19/2019  . Coronary artery disease 10/19/2019  . Gastroesophageal reflux disease with esophagitis 10/19/2019  . Encounter for general adult medical examination without abnormal findings 04/10/2017  . History of normocytic normochromic anemia 04/10/2017  . Vaccine counseling 03/07/2016  . Pure hypercholesterolemia 05/22/2015    Past Surgical History:  Procedure Laterality Date  . HEEL SPUR SURGERY Left   . VASECTOMY      Prior to Admission medications   Medication Sig Start Date End Date Taking? Authorizing Provider    Acidophilus Lactobacillus CAPS Take by mouth daily.     [provider]  aspirin EC 81 MG tablet Take by mouth.    [provider]  atorvastatin (LIPITOR) 40 MG tablet Take 60 mg by mouth daily.  07/18/17   [provider]  Multiple Vitamin (MULTI-VITAMIN) tablet Take 1 tablet by mouth every other day.     [provider]  pantoprazole (PROTONIX) 20 MG tablet Take 20 mg by mouth daily.  09/02/17   [provider]  Psyllium (METAMUCIL FIBER PO) Take 1 Scoop by mouth in the morning and at bedtime.     [provider]  tamsulosin (FLOMAX) 0.4 MG CAPS capsule Take 2 capsules by mouth daily. 10/22/19   [provider]    Allergies Citalopram  Family History  Problem Relation Age of Onset  . Heart failure Mother   . Heart failure Father     Social History Social History   Tobacco Use  . Smoking status: Never Smoker  . Smokeless tobacco: Never Used  Vaping Use  . Vaping Use: Never used  Substance Use Topics  . Alcohol use: Yes    Alcohol/week: 7.0 standard drinks    Types: 7 Glasses of wine per week  . Drug use: No    Review of Systems  Constitutional: No fever/chills Eyes: No visual changes. ENT: No sore throat. Cardiovascular: Denies chest pain. Respiratory: Denies shortness of breath. Gastrointestinal: No abdominal pain.  No nausea, no vomiting.  No diarrhea.  No constipation. Genitourinary: Negative for dysuria. Musculoskeletal: Negative for back pain. Skin: Negative for rash.  Neurological: Positive for left-sided numbness. Negative for headaches, focal weakness.   ____________________________________________   PHYSICAL EXAM:  VITAL SIGNS: ED Triage Vitals  Enc Vitals Group     BP 06/10/20 0243 (!) 163/80     Pulse Rate 06/10/20 0243 65     Resp 06/10/20 0243 18     Temp 06/10/20 0243 98.3 F (36.8 C)     Temp Source 06/10/20 0243 Oral     SpO2 06/10/20 0243 98 %     Weight 06/10/20 0244 160 lb  (72.6 kg)     Height 06/10/20 0244 5\' 6"  (1.676 m)     Head Circumference --      Peak Flow --      Pain Score 06/10/20 0244 0     Pain Loc --      Pain Edu? --      Excl. in [place]? --     Constitutional: Alert and oriented. Well appearing and in no acute distress. Eyes: Conjunctivae are normal. PERRL. EOMI. Head: Atraumatic. Nose: No congestion/rhinnorhea. Mouth/Throat: Mucous membranes are moist.   Neck: No stridor.   Cardiovascular: Normal rate, regular rhythm. Grossly normal heart sounds.  Good peripheral circulation. Respiratory: Normal respiratory effort.  No retractions. Lungs CTAB. Gastrointestinal: Soft and nontender. No distention. No abdominal bruits. No CVA tenderness. Musculoskeletal: No lower extremity tenderness nor edema.  No joint effusions. Neurologic: Alert and oriented x3. CN II-XII grossly intact. Normal speech and language. No gross focal neurologic deficits are appreciated. MAEx4. Skin:  Skin is warm, dry and intact. No rash noted. Psychiatric: Mood and affect are normal. Speech and behavior are normal.  ____________________________________________   LABS (all labs ordered are listed, but only abnormal results are displayed)  Labs Reviewed  COMPREHENSIVE METABOLIC PANEL - Abnormal; Notable for the following components:      Result Value   Glucose, Bld 108 (*)    All other components within normal limits  CBC WITH DIFFERENTIAL/PLATELET - Abnormal; Notable for the following components:   RBC 4.21 (*)    Hemoglobin 12.9 (*)    HCT 37.0 (*)    All other components within normal limits  CBG MONITORING, ED - Abnormal; Notable for the following components:   Glucose-Capillary 106 (*)    All other components within normal limits  RESPIRATORY PANEL BY RT PCR (FLU A&B, COVID)  ETHANOL  PROTIME-INR  URINE DRUG SCREEN, QUALITATIVE ([facility] ONLY)  URINALYSIS, ROUTINE W REFLEX MICROSCOPIC  HIV ANTIBODY (ROUTINE TESTING W REFLEX)  HEMOGLOBIN A1C  LIPID PANEL   TROPONIN I (HIGH SENSITIVITY)  TROPONIN I (HIGH SENSITIVITY)   ____________________________________________  EKG  ED ECG REPORT I, Nykolas Bacallao J, the attending physician, personally viewed and interpreted this ECG.   Date: 06/10/2020  EKG Time: 0250  Rate: 68  Rhythm: normal EKG, normal sinus rhythm  Axis: RAD  Intervals:none  ST&T Change: Nonspecific  ____________________________________________  RADIOLOGY I, Wake Conlee J, personally viewed and evaluated these images (plain radiographs) as part of my medical decision making, as well as reviewing the written report by the radiologist.  ED MD interpretation: CT head demonstrates no ICH as discussed with radiologist Dr. Collins [place]; chest x-ray demonstrates no acute cardiopulmonary process  Official radiology report(s): DG Chest Port 1 View  Result Date: 06/10/2020 CLINICAL DATA:  Code stroke EXAM: PORTABLE CHEST 1 VIEW COMPARISON:  12/25/2009 FINDINGS: The heart size and mediastinal contours are within normal limits. Both lungs are clear. The visualized skeletal structures are unremarkable. IMPRESSION: No active disease. Electronically Signed  By: Ulyses Jarred M.D.   On: 06/10/2020 03:43   CT HEAD CODE STROKE WO CONTRAST  Result Date: 06/10/2020 CLINICAL DATA:  Code stroke.  Left facial numbness EXAM: CT HEAD WITHOUT CONTRAST TECHNIQUE: Contiguous axial images were obtained from the base of the skull through the vertex without intravenous contrast. COMPARISON:  None. FINDINGS: Brain: There is no mass, hemorrhage or extra-axial collection. The size and configuration of the ventricles and extra-axial CSF spaces are normal. The brain parenchyma is normal, without evidence of acute or chronic infarction. Vascular: No abnormal hyperdensity of the major intracranial arteries or dural venous sinuses. No intracranial atherosclerosis. Skull: The visualized skull base, calvarium and extracranial soft tissues are normal. Sinuses/Orbits: No fluid  levels or advanced mucosal thickening of the visualized paranasal sinuses. No mastoid or middle ear effusion. The orbits are normal. ASPECTS [facility] Stroke Program Early CT Score) - Ganglionic level infarction (caudate, lentiform nuclei, internal capsule, insula, M1-M3 cortex): 7 - Supraganglionic infarction (M4-M6 cortex): 3 Total score (0-10 with 10 being normal): 10 IMPRESSION: 1. Normal head CT. 2. ASPECTS is 10. These results were called by telephone at the time of interpretation on 06/10/2020 at 3:00 am to provider Addis , who verbally acknowledged these results. Electronically Signed   By: Ulyses Jarred M.D.   On: 06/10/2020 03:00    ____________________________________________   PROCEDURES  Procedure(s) performed (including Critical Care):  .1-3 Lead EKG Interpretation Performed by: Paulette Blanch, MD Authorized by: Paulette Blanch, MD     Interpretation: normal     ECG rate:  65   ECG rate assessment: normal     Rhythm: sinus rhythm     Ectopy: none     Conduction: normal   Comments:     Patient placed on cardiac monitor to evaluate for arrhythmias    CRITICAL CARE Performed by: Paulette Blanch   Total critical care time: 30 minutes  Critical care time was exclusive of separately billable procedures and treating other patients.  Critical care was necessary to treat or prevent imminent or life-threatening deterioration.  Critical care was time spent personally by me on the following activities: development of treatment plan with patient and/or surrogate as well as nursing, discussions with consultants, evaluation of patient's response to treatment, examination of patient, obtaining history from patient or surrogate, ordering and performing treatments and interventions, ordering and review of laboratory studies, ordering and review of radiographic studies, pulse oximetry and re-evaluation of patient's condition.   NIH Stroke Scale  Interval: Baseline Time: 4:34 AM Person  Administering Scale: Tache Bobst J  Administer stroke scale items in the order listed. Record performance in each category after each subscale exam. Do not go back and change scores. Follow directions provided for each exam technique. Scores should reflect what the patient does, not what the clinician thinks the patient can do. The clinician should record answers while administering the exam and work quickly. Except where indicated, the patient should not be coached (i.e., repeated requests to patient to make a special effort).   1a  Level of consciousness: 0=alert; keenly responsive  1b. LOC questions:  0=Performs both tasks correctly  1c. LOC commands: 0=Performs both tasks correctly  2.  Best Gaze: 0=normal  3.  Visual: 0=No visual loss  4. Facial Palsy: 0=Normal symmetric movement  5a.  Motor left arm: 0=No drift, limb holds 90 (or 45) degrees for full 10 seconds  5b.  Motor right arm: 0=No drift, limb holds 90 (or 45) degrees for  full 10 seconds  6a. motor left leg: 0=No drift, limb holds 90 (or 45) degrees for full 10 seconds  6b  Motor right leg:  0=No drift, limb holds 90 (or 45) degrees for full 10 seconds  7. Limb Ataxia: 0=Absent  8.  Sensory: 1=Mild to moderate sensory loss; patient feels pinprick is less sharp or is dull on the affected side; there is a loss of superficial pain with pinprick but patient is aware He is being touched  9. Best Language:  0=No aphasia, normal  10. Dysarthria: 0=Normal  11. Extinction and Inattention: 0=No abnormality  12. Distal motor function: 0=Normal   Total:   1   ____________________________________________   INITIAL IMPRESSION / ASSESSMENT AND PLAN / ED COURSE  As part of my medical decision making, I reviewed the following data within the [place] History obtained from family, Nursing notes reviewed and incorporated, Labs reviewed, EKG interpreted, Old chart reviewed (PCP office visits for hyperlipidemia), Radiograph  reviewed, Discussed with admitting physician and Notes from prior ED visits (rare urgent care visit)     69 year old male with CAD, hyperlipidemia, GERD presenting with left-sided numbness. Differential diagnosis includes but is not limited to CVA, TIA, ACS, complex migraine, infectious, metabolic etiologies etc.  Patient went to bed at midnight and awoke approximately 1 hour prior to arrival with symptoms. Code stroke initiated. Will obtain urgent CT head and teleneurology evaluation.   Clinical Course as of Jun 10 433  Sat Jun 10, 2020  0177 Discussed with Dr. Alanson Aly from teleneurology who has evaluated the patient.  NIH stroke scale is also 1 by her evaluation; no TPA recommended.  Does not recommend CTA head or neck.  She does recommend 324 mg aspirin and 300 mg Plavix to be started and hospitalization for stroke rule out with MRI.   [JS]    Clinical Course User Index [JS] Paulette Blanch, MD     ____________________________________________   FINAL CLINICAL IMPRESSION(S) / ED DIAGNOSES  Final diagnoses:  Numbness     ED Discharge Orders    None      *Please note:  QUINTEZ MASELLI was evaluated in Emergency Department on 06/10/2020 for the symptoms described in the history of present illness. He was evaluated in the context of the global COVID-19 pandemic, which necessitated consideration that the patient might be at risk for infection with the SARS-CoV-2 virus that causes COVID-19. Institutional protocols and algorithms that pertain to the evaluation of patients at risk for COVID-19 are in a state of rapid change based on information released by regulatory bodies including the CDC and federal and state organizations. These policies and algorithms were followed during the patient's care in the ED.  Some ED evaluations and interventions may be delayed as a result of limited staffing during and the pandemic.*   Note:  This document was prepared using Dragon voice recognition  software and may include unintentional dictation errors.   Paulette Blanch, MD 06/10/20 [phone]

## 2020-06-10 NOTE — ED Notes (Signed)
MD Damita Dunnings notified that patient successfully passed swallow screen and tolerated ordered PO meds well after passing screen.

## 2020-06-10 NOTE — Progress Notes (Signed)
SLP Cancellation Note  Patient Details Name: Cory Hoover MRN: [id number] DOB: [birth year]   Cancelled treatment:       Reason Eval/Treat Not Completed: SLP screened, no needs identified, will sign off (chart reviewed; consulted pt in room). Pt denied any difficulty swallowing and is currently on a regular diet; tolerates swallowing pills w/ water per NSG. Noted frequent throat clearing and dry vocal quality -- pt endorsed a dry mouth/throat. Educated pt on keeping ice chips close by to suck on to moisten and hopefully lessen throat clearing occurrences. Pt conversed at conversational level w/out deficits noted; pt denied any speech-language deficits.  No further skilled ST services indicated as pt appears at his baseline. Pt agreed. NSG to reconsult if any change in status while admitted.      Orinda Kenner, MS, CCC-SLP Speech Language Pathologist Re[facility] Services [phone] [facility] 06/10/2020, 1:02 PM

## 2020-06-10 NOTE — ED Notes (Signed)
ED Provider at bedside. 

## 2020-06-10 NOTE — ED Notes (Signed)
Patient in MRI 

## 2020-06-19 DIAGNOSIS — Z8673 Personal history of transient ischemic attack (TIA), and cerebral infarction without residual deficits: Secondary | ICD-10-CM | POA: Insufficient documentation

## 2020-07-04 ENCOUNTER — Encounter: Payer: Self-pay | Admitting: Urology

## 2020-07-04 ENCOUNTER — Other Ambulatory Visit: Payer: Self-pay

## 2020-07-04 ENCOUNTER — Ambulatory Visit (INDEPENDENT_AMBULATORY_CARE_PROVIDER_SITE_OTHER): Payer: Medicare Other | Admitting: Urology

## 2020-07-04 VITALS — BP 150/79 | HR 63 | Ht 66.0 in | Wt 170.0 lb

## 2020-07-04 DIAGNOSIS — N138 Other obstructive and reflux uropathy: Secondary | ICD-10-CM | POA: Diagnosis not present

## 2020-07-04 DIAGNOSIS — N3281 Overactive bladder: Secondary | ICD-10-CM

## 2020-07-04 DIAGNOSIS — N401 Enlarged prostate with lower urinary tract symptoms: Secondary | ICD-10-CM

## 2020-07-04 DIAGNOSIS — R102 Pelvic and perineal pain: Secondary | ICD-10-CM

## 2020-07-04 LAB — BLADDER SCAN AMB NON-IMAGING

## 2020-07-04 MED ORDER — OXYBUTYNIN CHLORIDE ER 10 MG PO TB24: 10.0000 mg | ORAL_TABLET | Freq: Every day | ORAL | 3 refills | Status: DC

## 2020-07-04 NOTE — Progress Notes (Signed)
   07/04/2020 2:36 PM   Cory Hoover 08-Feb-1951 [id number]  Reason for visit: Follow up scrotal pain, BPH/OAB  HPI: I saw Cory Hoover back in clinic for follow-up of the above issues. He has a long history of scrotal pain, and this resolved after a 2-week course of NSAIDs previously. He has had no recurrence of scrotal pain and is doing well from that perspective.   He was recently hospitalized for a TIA with no residual deficits, and remains on anticoagulation now with Plavix. His primary urinary complaint is weak stream and urinary frequency and urgency during the day with nocturia 3 times per night. He is on Flomax 0.8 mg nightly. IPSS score today is 19, with quality of life mostly dissatisfied. PVR is borderline at 157 mL. His primary issue is urgency with fear of incontinence in public, though this has not happened before. He has a mixed picture with both weak stream and incomplete emptying, as well as urgency and frequency. He continues to drink coffee, tea, and soda during the day.  We had a long conversation about the overlap of symptoms between BPH and OAB and different treatment strategies. First, I recommended behavioral strategies and minimizing coffee, soda, and tea in the diet to see if this improves his urgency and frequency. We also discussed behavioral strategies regarding nocturia. He is interested in trying a different medication for his urinary symptoms, and I added oxybutynin 10 mg XL daily. We discussed the low risk of developing urinary retention by adding this medication, and return precautions were discussed extensively. I asked that he try behavioral strategies first for a few weeks prior to starting the oxybutynin. I also recommended close follow-up in 2 months with IPSS, PVR, and consider cystoscopy/TRUS at that visit if persistent symptoms to consider outlet procedures with his refractory symptoms.  Behavioral strategies discussed at length Trial of oxybutynin for  OAB symptoms RTC 6 to 8 weeks for IPSS, PVR, symptom check, consider pursuing cystoscopy and TRUS if refractory symptoms   Billey Co, MD  [facility] [address], [place], [place] [address] [phone]

## 2020-07-04 NOTE — Patient Instructions (Signed)
Benign Prostatic Hyperplasia  Benign prostatic hyperplasia (BPH) is an enlarged prostate gland that is caused by the normal aging process and not by cancer. The prostate is a walnut-sized gland that is involved in the production of semen. It is located in front of the rectum and below the bladder. The bladder stores urine and the urethra is the tube that carries the urine out of the body. The prostate may get bigger as a man gets older. An enlarged prostate can press on the urethra. This can make it harder to pass urine. The build-up of urine in the bladder can cause infection. Back pressure and infection may progress to bladder damage and kidney (renal) failure. What are the causes? This condition is part of a normal aging process. However, not all men develop problems from this condition. If the prostate enlarges away from the urethra, urine flow will not be blocked. If it enlarges toward the urethra and compresses it, there will be problems passing urine. What increases the risk? This condition is more likely to develop in men over the age of 50 years. What are the signs or symptoms? Symptoms of this condition include:  Getting up often during the night to urinate.  Needing to urinate frequently during the day.  Difficulty starting urine flow.  Decrease in size and strength of your urine stream.  Leaking (dribbling) after urinating.  Inability to pass urine. This needs immediate treatment.  Inability to completely empty your bladder.  Pain when you pass urine. This is more common if there is also an infection.  Urinary tract infection (UTI). How is this diagnosed? This condition is diagnosed based on your medical history, a physical exam, and your symptoms. Tests will also be done, such as:  A post-void bladder scan. This measures any amount of urine that may remain in your bladder after you finish urinating.  A digital rectal exam. In a rectal exam, your health care provider  checks your prostate by putting a lubricated, gloved finger into your rectum to feel the back of your prostate gland. This exam detects the size of your gland and any abnormal lumps or growths.  An exam of your urine (urinalysis).  A prostate specific antigen (PSA) screening. This is a blood test used to screen for prostate cancer.  An ultrasound. This test uses sound waves to electronically produce a picture of your prostate gland. Your health care provider may refer you to a specialist in kidney and prostate diseases (urologist). How is this treated? Once symptoms begin, your health care provider will monitor your condition (active surveillance or watchful waiting). Treatment for this condition will depend on the severity of your condition. Treatment may include:  Observation and yearly exams. This may be the only treatment needed if your condition and symptoms are mild.  Medicines to relieve your symptoms, including: ? Medicines to shrink the prostate. ? Medicines to relax the muscle of the prostate.  Surgery in severe cases. Surgery may include: ? Prostatectomy. In this procedure, the prostate tissue is removed completely through an open incision or with a laparoscope or robotics. ? Transurethral resection of the prostate (TURP). In this procedure, a tool is inserted through the opening at the tip of the penis (urethra). It is used to cut away tissue of the inner core of the prostate. The pieces are removed through the same opening of the penis. This removes the blockage. ? Transurethral incision (TUIP). In this procedure, small cuts are made in the prostate. This lessens   the prostate's pressure on the urethra. ? Transurethral microwave thermotherapy (TUMT). This procedure uses microwaves to create heat. The heat destroys and removes a small amount of prostate tissue. ? Transurethral needle ablation (TUNA). This procedure uses radio frequencies to destroy and remove a small amount of  prostate tissue. ? Interstitial laser coagulation ([place]). This procedure uses a laser to destroy and remove a small amount of prostate tissue. ? Transurethral electrovaporization (TUVP). This procedure uses electrodes to destroy and remove a small amount of prostate tissue. ? Prostatic urethral lift. This procedure inserts an implant to push the lobes of the prostate away from the urethra. Follow these instructions at home:  Take over-the-counter and prescription medicines only as told by your health care provider.  Monitor your symptoms for any changes. Contact your health care provider with any changes.  Avoid drinking large amounts of liquid before going to bed or out in public.  Avoid or reduce how much caffeine or alcohol you drink.  Give yourself time when you urinate.  Keep all follow-up visits as told by your health care provider. This is important. Contact a health care provider if:  You have unexplained back pain.  Your symptoms do not get better with treatment.  You develop side effects from the medicine you are taking.  Your urine becomes very dark or has a bad smell.  Your lower abdomen becomes distended and you have trouble passing your urine. Get help right away if:  You have a fever or chills.  You suddenly cannot urinate.  You feel lightheaded, or very dizzy, or you faint.  There are large amounts of blood or clots in the urine.  Your urinary problems become hard to manage.  You develop moderate to severe low back or flank pain. The flank is the side of your body between the ribs and the hip. These symptoms may represent a serious problem that is an emergency. Do not wait to see if the symptoms will go away. Get medical help right away. Call your local emergency services (911 in the [place] Do not drive yourself to the hospital. Summary  Benign prostatic hyperplasia (BPH) is an enlarged prostate that is caused by the normal aging process and not by  cancer.  An enlarged prostate can press on the urethra. This can make it hard to pass urine.  This condition is part of a normal aging process and is more likely to develop in men over the age of 61 years.  Get help right away if you suddenly cannot urinate. This information is not intended to replace advice given to you by your health care provider. Make sure you discuss any questions you have with your health care provider. Document Revised: 06/09/2018 Document Reviewed: 08/19/2016 Elsevier Patient Education  [place].   Overactive Bladder, Adult  Overactive bladder refers to a condition in which a person has a sudden need to pass urine. The person may leak urine if he or she cannot get to the bathroom fast enough (urinary incontinence). A person with this condition may also wake up several times in the night to go to the bathroom. Overactive bladder is associated with poor nerve signals between your bladder and your brain. Your bladder may get the signal to empty before it is full. You may also have very sensitive muscles that make your bladder squeeze too soon. These symptoms might interfere with daily work or social activities. What are the causes? This condition may be associated with or caused by:  Urinary tract infection.  Infection of nearby tissues, such as the prostate.  Prostate enlargement.  Surgery on the uterus or urethra.  Bladder stones, inflammation, or tumors.  Drinking too much caffeine or alcohol.  Certain medicines, especially medicines that get rid of extra fluid in the body (diuretics).  Muscle or nerve weakness, especially from: ? A spinal cord injury. ? Stroke. ? Multiple sclerosis. ? Parkinson's disease.  Diabetes.  Constipation. What increases the risk? You may be at greater risk for overactive bladder if you:  Are an older adult.  Smoke.  Are going through menopause.  Have prostate problems.  Have a neurological disease, such  as stroke, dementia, Parkinson's disease, or multiple sclerosis (MS).  Eat or drink things that irritate the bladder. These include alcohol, spicy food, and caffeine.  Are overweight or obese. What are the signs or symptoms? Symptoms of this condition include:  Sudden, strong urge to urinate.  Leaking urine.  Urinating 8 or more times a day.  Waking up to urinate 2 or more times a night. How is this diagnosed? Your health care provider may suspect overactive bladder based on your symptoms. He or she will diagnose this condition by:  A physical exam and medical history.  Blood or urine tests. You might need bladder or urine tests to help determine what is causing your overactive bladder. You might also need to see a health care provider who specializes in urinary tract problems (urologist). How is this treated? Treatment for overactive bladder depends on the cause of your condition and whether it is mild or severe. You can also make lifestyle changes at home. Options include:  Bladder training. This may include: ? Learning to control the urge to urinate by following a schedule that directs you to urinate at regular intervals (timed voiding). ? Doing Kegel exercises to strengthen your pelvic floor muscles, which support your bladder. Toning these muscles can help you control urination, even if your bladder muscles are overactive.  Special devices. This may include: ? Biofeedback, which uses sensors to help you become aware of your body's signals. ? Electrical stimulation, which uses electrodes placed inside the body (implanted) or outside the body. These electrodes send gentle pulses of electricity to strengthen the nerves or muscles that control the bladder. ? Women may use a plastic device that fits into the vagina and supports the bladder (pessary).  Medicines. ? Antibiotics to treat bladder infection. ? Antispasmodics to stop the bladder from releasing urine at the wrong  time. ? Tricyclic antidepressants to relax bladder muscles. ? Injections of botulinum toxin type A directly into the bladder tissue to relax bladder muscles.  Lifestyle changes. This may include: ? Weight loss. Talk to your health care provider about weight loss methods that would work best for you. ? Diet changes. This may include reducing how much alcohol and caffeine you consume, or drinking fluids at different times of the day. ? Not smoking. Do not use any products that contain nicotine or tobacco, such as cigarettes and e-cigarettes. If you need help quitting, ask your health care provider.  Surgery. ? A device may be implanted to help manage the nerve signals that control urination. ? An electrode may be implanted to stimulate electrical signals in the bladder. ? A procedure may be done to change the shape of the bladder. This is done only in very severe cases. Follow these instructions at home: Lifestyle  Make any diet or lifestyle changes that are recommended by your health care  provider. These may include: ? Drinking less fluid or drinking fluids at different times of the day. ? Cutting down on caffeine or alcohol. ? Doing Kegel exercises. ? Losing weight if needed. ? Eating a healthy and balanced diet to prevent constipation. This may include:  Eating foods that are high in fiber, such as fresh fruits and vegetables, whole grains, and beans.  Limiting foods that are high in fat and processed sugars, such as fried and sweet foods. General instructions  Take over-the-counter and prescription medicines only as told by your health care provider.  If you were prescribed an antibiotic medicine, take it as told by your health care provider. Do not stop taking the antibiotic even if you start to feel better.  Use any implants or pessary as told by your health care provider.  If needed, wear pads to absorb urine leakage.  Keep a journal or log to track how much and when you  drink and when you feel the need to urinate. This will help your health care provider monitor your condition.  Keep all follow-up visits as told by your health care provider. This is important. Contact a health care provider if:  You have a fever.  Your symptoms do not get better with treatment.  Your pain and discomfort get worse.  You have more frequent urges to urinate. Get help right away if:  You are not able to control your bladder. Summary  Overactive bladder refers to a condition in which a person has a sudden need to pass urine.  Several conditions may lead to an overactive bladder.  Treatment for overactive bladder depends on the cause and severity of your condition.  Follow your health care provider's instructions about lifestyle changes, doing Kegel exercises, keeping a journal, and taking medicines. This information is not intended to replace advice given to you by your health care provider. Make sure you discuss any questions you have with your health care provider. Document Revised: 11/05/2018 Document Reviewed: 07/31/2017 Elsevier Patient Education  [place].

## 2020-09-05 ENCOUNTER — Ambulatory Visit: Payer: Medicare Other | Admitting: Urology

## 2020-09-12 ENCOUNTER — Ambulatory Visit: Payer: Medicare Other | Admitting: Urology

## 2020-09-26 ENCOUNTER — Encounter: Payer: Self-pay | Admitting: Urology

## 2020-09-26 ENCOUNTER — Other Ambulatory Visit
Admission: RE | Admit: 2020-09-26 | Discharge: 2020-09-26 | Disposition: A | Discharge status: Home / Self Care | Payer: Medicare Other | Attending: Urology | Admitting: Urology

## 2020-09-26 ENCOUNTER — Other Ambulatory Visit: Payer: Self-pay

## 2020-09-26 ENCOUNTER — Ambulatory Visit (INDEPENDENT_AMBULATORY_CARE_PROVIDER_SITE_OTHER): Payer: Medicare Other | Admitting: Urology

## 2020-09-26 VITALS — BP 161/84 | HR 63 | Ht 66.0 in | Wt 166.0 lb

## 2020-09-26 DIAGNOSIS — N401 Enlarged prostate with lower urinary tract symptoms: Secondary | ICD-10-CM | POA: Diagnosis not present

## 2020-09-26 DIAGNOSIS — Z125 Encounter for screening for malignant neoplasm of prostate: Secondary | ICD-10-CM

## 2020-09-26 DIAGNOSIS — N138 Other obstructive and reflux uropathy: Secondary | ICD-10-CM | POA: Diagnosis not present

## 2020-09-26 MED ORDER — TAMSULOSIN HCL 0.4 MG PO CAPS
0.8000 mg | ORAL_CAPSULE | Freq: Every day | ORAL | 11 refills | Status: DC
Start: 2020-09-26 — End: 2021-03-03

## 2020-09-26 NOTE — Patient Instructions (Addendum)
Cystoscopy Cystoscopy is a procedure that is used to help diagnose and sometimes treat conditions that affect the lower urinary tract. The lower urinary tract includes the bladder and the urethra. The urethra is the tube that drains urine from the bladder. Cystoscopy is done using a thin, tube-shaped instrument with a light and camera at the end (cystoscope). The cystoscope may be hard or flexible, depending on the goal of the procedure. The cystoscope is inserted through the urethra, into the bladder. Cystoscopy may be recommended if you have:  Urinary tract infections that keep coming back.  Blood in the urine (hematuria).  An inability to control when you urinate (urinary incontinence) or an overactive bladder.  Unusual cells found in a urine sample.  A blockage in the urethra, such as a urinary stone.  Painful urination.  An abnormality in the bladder found during an intravenous pyelogram (IVP) or CT scan. Cystoscopy may also be done to remove a sample of tissue to be examined under a microscope (biopsy). Tell a health care provider about:  Any allergies you have.  All medicines you are taking, including vitamins, herbs, eye drops, creams, and over-the-counter medicines.  Any problems you or family members have had with anesthetic medicines.  Any blood disorders you have.  Any surgeries you have had.  Any medical conditions you have.  Whether you are pregnant or may be pregnant. What are the risks? Generally, this is a safe procedure. However, problems may occur, including:  Infection.  Bleeding.  Allergic reactions to medicines.  Damage to other structures or organs. What happens before the procedure? Medicines Ask your health care provider about:  Changing or stopping your regular medicines. This is especially important if you are taking diabetes medicines or blood thinners.  Taking medicines such as aspirin and ibuprofen. These medicines can thin your blood. Do  not take these medicines unless your health care provider tells you to take them.  Taking over-the-counter medicines, vitamins, herbs, and supplements. Tests You may have an exam or testing, such as:  X-rays of the bladder, urethra, or kidneys.  CT scan of the abdomen or pelvis.  Urine tests to check for signs of infection. General instructions  Follow instructions from your health care provider about eating or drinking restrictions.  Ask your health care provider what steps will be taken to help prevent infection. These steps may include: ? Washing skin with a germ-killing soap. ? Taking antibiotic medicine.  Plan to have a responsible adult take you home from the hospital or clinic. What happens during the procedure?  You will be given one or more of the following: ? A medicine to help you relax (sedative). ? A medicine to numb the area (local anesthetic).  The area around the opening of your urethra will be cleaned.  The cystoscope will be passed through your urethra into your bladder.  Germ-free (sterile) fluid will flow through the cystoscope to fill your bladder. The fluid will stretch your bladder so that your health care provider can clearly examine your bladder walls.  Your doctor will look at the urethra and bladder. Your doctor may take a biopsy or remove stones.  The cystoscope will be removed, and your bladder will be emptied. The procedure may vary among health care providers and hospitals.   What can I expect after the procedure? After the procedure, it is common to have:  Some soreness or pain in your abdomen and urethra.  Urinary symptoms. These include: ? Mild pain or burning  when you urinate. Pain should stop within a few minutes after you urinate. This may last for up to 1 week. ? A small amount of blood in your urine for several days. ? Feeling like you need to urinate but producing only a small amount of urine. Follow these instructions at  home: Medicines  Take over-the-counter and prescription medicines only as told by your health care provider.  If you were prescribed an antibiotic medicine, take it as told by your health care provider. Do not stop taking the antibiotic even if you start to feel better. General instructions  Return to your normal activities as told by your health care provider. Ask your health care provider what activities are safe for you.  If you were given a sedative during the procedure, it can affect you for several hours. Do not drive or operate machinery until your health care provider says that it is safe.  Watch for any blood in your urine. If the amount of blood in your urine increases, call your health care provider.  Follow instructions from your health care provider about eating or drinking restrictions.  If a tissue sample was removed for testing (biopsy) during your procedure, it is up to you to get your test results. Ask your health care provider, or the department that is doing the test, when your results will be ready.  Drink enough fluid to keep your urine pale yellow.  Keep all follow-up visits. This is important. Contact a health care provider if:  You have pain that gets worse or does not get better with medicine, especially pain when you urinate.  You have trouble urinating.  You have more blood in your urine. Get help right away if:  You have blood clots in your urine.  You have abdominal pain.  You have a fever or chills.  You are unable to urinate. Summary  Cystoscopy is a procedure that is used to help diagnose and sometimes treat conditions that affect the lower urinary tract.  Cystoscopy is done using a thin, tube-shaped instrument with a light and camera at the end.  After the procedure, it is common to have some soreness or pain in your abdomen and urethra.  Watch for any blood in your urine. If the amount of blood in your urine increases, call your health  care provider.  If you were prescribed an antibiotic medicine, take it as told by your health care provider. Do not stop taking the antibiotic even if you start to feel better. This information is not intended to replace advice given to you by your health care provider. Make sure you discuss any questions you have with your health care provider. Document Revised: 02/25/2020 Document Reviewed: 02/25/2020 Elsevier Patient Education  2021 [place].   Prostatic Urethral Lift  Prostatic urethral lift is a surgical procedure to relieve symptoms of prostate gland enlargement that occurs with age (benign prostatic hypertrophy, BPH). The part of the body that drains urine from the bladder (urethra) passes between the two lobes of the prostate. As the prostate enlarges, it can push on the urethra and cause problems with urinating. This procedure involves placing an implant that holds the prostate away from the urethra. The procedure is performed with a thin operating telescope (cystoscope) that is inserted through the tip of the penis and moved up the urethra to the prostate. This is less invasive than other procedures that require an incision. You may have this procedure if:  You have symptoms of  BPH.  Your prostate is not severely enlarged.  Medicines to treat BPH are not working or not tolerated.  You want to avoid possible sexual side effects from medicines or other procedures that are used to treat BPH. Tell a health care provider about:  Any allergies you have.  All medicines you are taking, including vitamins, herbs, eye drops, creams, and over-the-counter medicines.  Previous problems you or members of your family have had with the use of anesthetics.  Any blood disorders you have.  Previous surgeries you have had.  Any medical conditions you have. What are the risks?  Bleeding.  Infection.  Leaking of urine (incontinence).  Allergic reactions to medicines.  Return of  BPH symptoms after 2 years, requiring more treatment. What happens before the procedure?  Ask your health care provider about: ? Changing or stopping your regular medicines. This is especially important if you are taking diabetes medicines or blood thinners. ? Taking medicines such as aspirin and ibuprofen. These medicines can thin your blood. Do not take these medicines before your procedure if your health care provider instructs you not to.  Follow instructions from your health care provider about eating or drinking restrictions.  Plan to have someone take you home from the hospital or clinic. What happens during the procedure?  To lower your risk of infection: ? Your health care team will wash or sanitize their hands. ? Your skin will be washed with soap.  An IV may be inserted into one of your veins.  You will be given one or more of the following: ? A medicine to help you relax (sedative). ? A medicine that is injected into your urethra to numb the area (local anesthetic). ? A medicine to make you fall asleep (general anesthetic).  A cystoscope will be inserted into your penis and moved through your urethra to your prostate.  A device will be inserted through the cystoscope and used to press the lobes of your prostate away from your urethra.  Implants will be inserted through the device to hold the lobes of your prostate in the widened position.  The device and cystoscope will be removed. The procedure may vary among health care providers and hospitals. What happens after the procedure?  Your blood pressure, heart rate, breathing rate, and blood oxygen level will be monitored until the medicines you were given have worn off.  Do not drive for 24 hours if you were given a sedative. Summary  Prostatic urethral lift is a surgical procedure to relieve symptoms of prostate gland enlargement that occurs with age (benign prostatic hypertrophy, BPH).  The procedure is performed  with a thin operating telescope (cystoscope) that is inserted through the tip of the penis and moved up the part of the body that drains urine from the bladder (urethra) to reach the prostate. This is less invasive than other procedures that require an incision.  Plan to have someone take you home from the hospital or clinic. You will not be allowed to drive for 24 hours if you were given a sedative during the procedure. This information is not intended to replace advice given to you by your health care provider. Make sure you discuss any questions you have with your health care provider. Document Revised: 03/23/2020 Document Reviewed: 03/23/2020 Elsevier Patient Education  [place].  Holmium Laser Enucleation of the Prostate (HoLEP)  HoLEP is a treatment for men with benign prostatic hyperplasia (BPH). The laser surgery removed blockages of urine flow, and  is done without any incisions on the body.     What is HoLEP?  HoLEP is a type of laser surgery used to treat obstruction (blockage) of urine flow as a result of benign prostatic hyperplasia (BPH). In men with BPH, the prostate gland is not cancerous, but has become enlarged. An enlarged prostate can result in a number of urinary tract symptoms such as weak urinary stream, difficulty in starting urination, inability to urinate, frequent urination, or getting up at night to urinate.  HoLEP was developed in the 1990's as a more effective and less expensive surgical option for BPH, compared to other surgical options such as laser vaporization(PVP/greenlight laser), transurethral resection of the prostate(TURP), and open simple prostatectomy.   What happens during a HoLEP?  HoLEP requires general anesthesia ("asleep" throughout the procedure).   An antibiotic is given to reduce the risk of infection  A surgical instrument called a resectoscope is inserted through the urethra (the tube that carries urine from the bladder). The  resectoscope has a camera that allows the surgeon to view the internal structure of the prostate gland, and to see where the incisions are being made during surgery.  The laser is inserted into the resectoscope and is used to enucleate (free up) the enlarged prostate tissue from the capsule (outer shell) and then to seal up any blood vessels. The tissue that has been removed is pushed back into the bladder.  A morcellator is placed through the resectoscope, and is used to suction out the prostate tissue that has been pushed into the bladder.  When the prostate tissue has been removed, the resectoscope is removed, and a foley catheter is placed to allow healing and drain the urine from the bladder.     What happens after a HoLEP?  More than 90% of patients go home the same day a few hours after surgery. Less than 10% will be admitted to the hospital overnight for observation to monitor the urine, or if they have other medical problems.  Fluid is flushed through the catheter for about 1 hour after surgery to clear any blood from the urine. It is normal to have some blood in the urine after surgery. The need for blood transfusion is extremely rare.  Eating and drinking are permitted after the procedure once the patient has fully awakened from anesthesia.  The catheter is usually removed 2-3 days after surgery- the patient will come to clinic to have the catheter removed and make sure they can urinate on their own.  It is very important to drink lots of fluids after surgery for one week to keep the bladder flushed.  At first, there may be some burning with urination, but this typically improved within a few hours to days. Most patients do not have a significant amount of pain, and narcotic pain medications are rarely needed.  Symptoms of urinary frequency, urgency, and even leakage are NORMAL for the first few weeks after surgery as the bladder adjusts after having to work hard against blockage  from the prostate for many years. This will improve, but can sometimes take several months.  The use of pelvic floor exercises (Kegel exercises) can help improve problems with urinary incontinence.   After catheter removal, patients will be seen at 6 weeks and 6 months for symptom check  No heavy lifting for at least 2-3 weeks after surgery, however patients can walk and do light activities the first day after surgery. Return to work time depends on occupation.  What are the advantages of HoLEP?  HoLEP has been studied in many different parts of the world and has been shown to be a safe and effective procedure. Although there are many types of BPH surgeries available, HoLEP offers a unique advantage in being able to remove a large amount of tissue without any incisions on the body, even in very large prostates, while decreasing the risk of bleeding and providing tissue for pathology (to look for cancer). This decreases the need for blood transfusions during surgery, minimizes hospital stay, and reduces the risk of needing repeat treatment.  What are the side effects of HoLEP?  Temporary burning and bleeding during urination. Some blood may be seen in the urine for weeks after surgery and is part of the healing process.  Urinary incontinence (inability to control urine flow) is expected in all patients immediately after surgery and they should wear pads for the first few days/weeks. This typically improves over the course of several weeks. Performing Kegel exercises can help decrease leakage from stress maneuvers such as coughing, sneezing, or lifting. The rate of long term leakage is very low. Patients may also have leakage with urgency and this may be treated with medication. The risk of urge incontinence can be dependent on several factors including age, prostate size, symptoms, and other medical problems.  Retrograde ejaculation or "backwards ejaculation." In 75% of cases, the patient will  not see any fluid during ejaculation after surgery.  Erectile function is generally not significantly affected.   What are the risks of HoLEP?  Injury to the urethra or development of scar tissue at a later date  Injury to the capsule of the prostate (typically treated with longer catheterization).  Injury to the bladder or ureteral orifices (where the urine from the kidney drains out)  Infection of the bladder, testes, or kidneys  Return of urinary obstruction at a later date requiring another operation (<2%)  Need for blood transfusion or re-operation due to bleeding  Failure to relieve all symptoms and/or need for prolonged catheterization after surgery  5-15% of patients are found to have previously undiagnosed prostate cancer in their specimen. Prostate cancer can be treated after HoLEP.  Standard risks of anesthesia including blood clots, heart attacks, etc  When should I call my doctor?  Fever over 101.3 degrees  Inability to urinate, or large blood clots in the urine

## 2020-09-26 NOTE — Progress Notes (Signed)
   09/26/2020 11:46 AM   Cory Hoover 10-May-1951 [id number]  Reason for visit: Follow up BPH, PSA screening  HPI: I saw [name] back in clinic for his history of BPH and urinary symptoms as well as PSA screening.  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  His primary urinary complaints are weak stream, and urinary frequency and urgency during the day with nocturia 2-3 times at night.  He doubled his Flomax to 0.8 mg nightly which has made the most significant improvement in his symptoms decreasing his frequency during the day from 15 times to 9 times.  He also continues to drink soda, tea, coffee during the day.  At our last visit, we trialed a course of oxybutynin 10 mg XL to see if this improves his more overactive symptoms, but he notices no improvement over the last 2 months.  IPSS score today is 19 which is unchanged from 19 prior, and quality of life mostly dissatisfied.  PVR is normal at 30 mL today.  There is no prior cross-sectional imaging to review.  He also had a number of questions about PSA screening today, and we reviewed the AUA guidelines that recommend screening every 1 to 2 years up to age 48.  His last PSA was 0.67 in 2015.  I recommended pursuing a cystoscopy and transrectal ultrasound with his persistent urinary symptoms despite Flomax and no improvement on oxybutynin which would indicate more of a BPH and obstructive picture.  He has a lot of other life things going on right now would like to hold off for the time being, but is interested in cystoscopy and TRUS in 1 year, and we will schedule these.  We reviewed the differences between the UroLift procedure and HOLEP, as well as possibility of urethral stricture, bladder stones, or other abnormalities.  Continue Flomax 0.8 mg nightly, discontinue oxybutynin Behavioral strategies reviewed PSA today, call with results RTC 1 year for cystoscopy and TRUS, sooner if  worsening symptoms   Billey Co, MD  [facility] [address], [place], [place] [address] [phone]

## 2020-09-27 LAB — PSA (REFLEX TO FREE) (SERIAL): Prostate Specific Ag, Serum: 0.9 ng/mL (ref 0.0–4.0)

## 2020-09-28 ENCOUNTER — Telehealth: Payer: Self-pay

## 2020-09-28 NOTE — Telephone Encounter (Signed)
Called pt informed him of the information below. Pt gave verbal understanding.  

## 2020-09-28 NOTE — Telephone Encounter (Signed)
-----   Message from Billey Co, MD sent at 09/28/2020  7:56 AM EST ----- Good news, PSA stable and normal at 0.9, keep follow up as scheduled  Nickolas Madrid, MD 09/28/2020

## 2020-09-30 ENCOUNTER — Other Ambulatory Visit: Payer: Self-pay | Admitting: Urology

## 2020-11-06 ENCOUNTER — Ambulatory Visit: Payer: Medicare Other | Admitting: Surgery

## 2020-11-20 ENCOUNTER — Encounter: Payer: Self-pay | Admitting: *Deleted

## 2020-11-20 ENCOUNTER — Ambulatory Visit: Payer: Medicare Other | Admitting: Surgery

## 2020-11-20 ENCOUNTER — Other Ambulatory Visit: Payer: Self-pay

## 2020-11-20 ENCOUNTER — Ambulatory Visit (INDEPENDENT_AMBULATORY_CARE_PROVIDER_SITE_OTHER): Payer: Medicare Other | Admitting: Surgery

## 2020-11-20 ENCOUNTER — Encounter: Payer: Self-pay | Admitting: Surgery

## 2020-11-20 VITALS — BP 172/76 | HR 69 | Temp 97.7°F | Ht 66.0 in | Wt 166.6 lb

## 2020-11-20 DIAGNOSIS — K2100 Gastro-esophageal reflux disease with esophagitis, without bleeding: Secondary | ICD-10-CM

## 2020-11-20 DIAGNOSIS — K449 Diaphragmatic hernia without obstruction or gangrene: Secondary | ICD-10-CM | POA: Diagnosis not present

## 2020-11-20 NOTE — Patient Instructions (Addendum)
Your Barium Swallow test is scheduled for 11/22/2020 @ 9:30 at [facility]. Nothing to eat or drink 4 hours prior. Your CT is schedule for 11/29/2020 @  9:30 am (arrive at 9:15 am) at [facility] in [place]. Nothing to eat or drink 4 hours prior. Please pick up contrast between now and the day before scan and follow instructions given.    I have put in a referral to GI in [place]. They will call you for an appointment. If you have any concerns or questions, please feel free to call our office.     Cough, Adult A cough helps to clear your throat and lungs. A cough may be a sign of an illness or another medical condition. An acute cough may only last 2-3 weeks, while a chronic cough may last 8 or more weeks. Many things can cause a cough. They include:  Germs (viruses or bacteria) that attack the airway.  Breathing in things that bother (irritate) your lungs.  Allergies.  Asthma.  Mucus that runs down the back of your throat (postnasal drip).  Smoking.  Acid backing up from the stomach into the tube that moves food from the mouth to the stomach (gastroesophageal reflux).  Some medicines.  Lung problems.  Other medical conditions, such as heart failure or a blood clot in the lung (pulmonary embolism). Follow these instructions at home: Medicines  Take over-the-counter and prescription medicines only as told by your doctor.  Talk with your doctor before you take medicines that stop a cough (cough suppressants). Lifestyle  Do not smoke, and try not to be around smoke. Do not use any products that contain nicotine or tobacco, such as cigarettes, e-cigarettes, and chewing tobacco. If you need help quitting, ask your doctor.  Drink enough fluid to keep your pee (urine) pale yellow.  Avoid caffeine.  Do not drink alcohol if your doctor tells you not to drink.   General instructions  Watch for any changes in your cough. Tell your doctor about them.  Always cover your mouth  when you cough.  Stay away from things that make you cough, such as perfume, candles, campfire smoke, or cleaning products.  If the air is dry, use a cool mist vaporizer or humidifier in your home.  If your cough is worse at night, try using extra pillows to raise your head up higher while you sleep.  Rest as needed.  Keep all follow-up visits as told by your doctor. This is important.   Contact a doctor if:  You have new symptoms.  You cough up pus.  Your cough does not get better after 2-3 weeks, or your cough gets worse.  Cough medicine does not help your cough and you are not sleeping well.  You have pain that gets worse or pain that is not helped with medicine.  You have a fever.  You are losing weight and you do not know why.  You have night sweats. Get help right away if:  You cough up blood.  You have trouble breathing.  Your heartbeat is very fast. These symptoms may be an emergency. Do not wait to see if the symptoms will go away. Get medical help right away. Call your local emergency services (911 in the [place] Do not drive yourself to the hospital. Summary  A cough helps to clear your throat and lungs. Many things can cause a cough.  Take over-the-counter and prescription medicines only as told by your doctor.  Always cover your mouth when  you cough.  Contact a doctor if you have new symptoms or you have a cough that does not get better or gets worse. This information is not intended to replace advice given to you by your health care provider. Make sure you discuss any questions you have with your health care provider. Document Revised: 09/03/2019 Document Reviewed: 08/03/2018 Elsevier Patient Education  [place].     Hiatal Hernia  A hiatal hernia occurs when part of the stomach slides above the muscle that separates the abdomen from the chest (diaphragm). A person can be born with a hiatal hernia (congenital), or it may develop over time.  In almost all cases of hiatal hernia, only the top part of the stomach pushes through the diaphragm. Many people have a hiatal hernia with no symptoms. The larger the hernia, the more likely it is that you will have symptoms. In some cases, a hiatal hernia allows stomach acid to flow back into the tube that carries food from your mouth to your stomach (esophagus). This may cause heartburn symptoms. Severe heartburn symptoms may mean that you have developed a condition called gastroesophageal reflux disease (GERD). What are the causes? This condition is caused by a weakness in the opening (hiatus) where the esophagus passes through the diaphragm to attach to the upper part of the stomach. A person may be born with a weakness in the hiatus, or a weakness can develop over time. What increases the risk? This condition is more likely to develop in:  Older people. Age is a major risk factor for a hiatal hernia, especially if you are over the age of 97.  Pregnant women.  People who are overweight.  People who have frequent constipation. What are the signs or symptoms? Symptoms of this condition usually develop in the form of GERD symptoms. Symptoms include:  Heartburn.  Belching.  Indigestion.  Trouble swallowing.  Coughing or wheezing.  Sore throat.  Hoarseness.  Chest pain.  Nausea and vomiting. How is this diagnosed? This condition may be diagnosed during testing for GERD. Tests that may be done include:  X-rays of your stomach or chest.  An upper gastrointestinal (GI) series. This is an X-ray exam of your GI tract that is taken after you swallow a chalky liquid that shows up clearly on the X-ray.  Endoscopy. This is a procedure to look into your stomach using a thin, flexible tube that has a tiny camera and light on the end of it. How is this treated? This condition may be treated by:  Dietary and lifestyle changes to help reduce GERD symptoms.  Medicines. These may  include: ? Over-the-counter antacids. ? Medicines that make your stomach empty more quickly. ? Medicines that block the production of stomach acid (H2 blockers). ? Stronger medicines to reduce stomach acid (proton pump inhibitors).  Surgery to repair the hernia, if other treatments are not helping. If you have no symptoms, you may not need treatment. Follow these instructions at home: Lifestyle and activity  Do not use any products that contain nicotine or tobacco, such as cigarettes and e-cigarettes. If you need help quitting, ask your health care provider.  Try to achieve and maintain a healthy body weight.  Avoid putting pressure on your abdomen. Anything that puts pressure on your abdomen increases the amount of acid that may be pushed up into your esophagus. ? Avoid bending over, especially after eating. ? Raise the head of your bed by putting blocks under the legs. This keeps your  head and esophagus higher than your stomach. ? Do not wear tight clothing around your chest or stomach. ? Try not to strain when having a bowel movement, when urinating, or when lifting heavy objects. Eating and drinking  Avoid foods that can worsen GERD symptoms. These may include: ? Fatty foods, like fried foods. ? Citrus fruits, like oranges or lemon. ? Other foods and drinks that contain acid, like orange juice or tomatoes. ? Spicy food. ? Chocolate.  Eat frequent small meals instead of three large meals a day. This helps prevent your stomach from getting too full. ? Eat slowly. ? Do not lie down right after eating. ? Do not eat 1-2 hours before bed.  Do not drink beverages with caffeine. These include cola, coffee, cocoa, and tea.  Do not drink alcohol. General instructions  Take over-the-counter and prescription medicines only as told by your health care provider.  Keep all follow-up visits as told by your health care provider. This is important. Contact a health care provider  if:  Your symptoms are not controlled with medicines or lifestyle changes.  You are having trouble swallowing.  You have coughing or wheezing that will not go away. Get help right away if:  Your pain is getting worse.  Your pain spreads to your arms, neck, jaw, teeth, or back.  You have shortness of breath.  You sweat for no reason.  You feel sick to your stomach (nauseous) or you vomit.  You vomit blood.  You have bright red blood in your stools.  You have black, tarry stools. This information is not intended to replace advice given to you by your health care provider. Make sure you discuss any questions you have with your health care provider. Document Revised: 06/27/2017 Document Reviewed: 02/17/2017 Elsevier Patient Education  [place].

## 2020-11-20 NOTE — Progress Notes (Signed)
Patient ID: Cory Hoover, male   DOB: [birth year], 70 y.o.   MRN: [id number]  HPI Cory Hoover is a 70 y.o. male seen in consultation at the request of Dr. Lanney Gins for large hiatal hernia and GERD causing chronic cough.  He reports that he has known of his hiatal hernia for close to 30 years.  His wok out was done about 3 decades.  There is no recent imaging is other than a chest x-ray that I personally reviewed and shows no evidence of any acute major pulmonary or cardiac abnormality.  He also has been placed back again on PPI.  He was seen by pulmonary medicine and actually his PPI was doubled, since then he has reported that his reflux is better and his cough is somewhat better he continues to have cough fits.  No major abdominal operations.  He is able to perform more than 4 METS of activity without any shortness or chest pain.  He does not report any dysphagia.  His last colonoscopy and EGD was close to 10 years ago. They found a H/H. He is currently on Plavix due to a recent TIA.   HPI  Past Medical History:  Diagnosis Date  . Anxiety   . Coronary artery disease   . GERD (gastroesophageal reflux disease)   . High cholesterol   . History of impacted cerumen     Past Surgical History:  Procedure Laterality Date  . HEEL SPUR SURGERY Left   . VASECTOMY      Family History  Problem Relation Age of Onset  . Heart failure Mother   . Heart failure Father     Social History Social History   Tobacco Use  . Smoking status: Never Smoker  . Smokeless tobacco: Never Used  Vaping Use  . Vaping Use: Never used  Substance Use Topics  . Alcohol use: Yes    Alcohol/week: 7.0 standard drinks    Types: 7 Glasses of wine per week  . Drug use: No    Allergies  Allergen Reactions  . Citalopram Other (See Comments)    Reaction: Sexual side effects    Current Outpatient Medications  Medication Sig Dispense Refill  . Acidophilus Lactobacillus CAPS Take by mouth daily.     Marland Kitchen  amLODipine (NORVASC) 2.5 MG tablet Take 1 tablet by mouth daily.    Marland Kitchen atorvastatin (LIPITOR) 40 MG tablet Take 60 mg by mouth daily.   1  . clopidogrel (PLAVIX) 75 MG tablet Take 1 tablet by mouth daily.    . Multiple Vitamin (MULTI-VITAMIN) tablet Take 1 tablet by mouth every other day.     . pantoprazole (PROTONIX) 20 MG tablet Take 20 mg by mouth daily.     . Psyllium (METAMUCIL FIBER PO) Take 1 Scoop by mouth in the morning and at bedtime.     . tamsulosin (FLOMAX) 0.4 MG CAPS capsule Take 2 capsules (0.8 mg total) by mouth daily. 60 capsule 11   No current facility-administered medications for this visit.     Review of Systems Full ROS  was asked and was negative except for the information on the HPI  Physical Exam Blood pressure (!) 172/76, pulse 69, temperature 97.7 F (36.5 C), temperature source Oral, height 5\' 6"  (1.676 m), weight 166 lb 9.6 oz (75.6 kg), SpO2 97 %. CONSTITUTIONAL: NAD. EYES: Pupils are equal, round, , Sclera are non-icteric. EARS, NOSE, MOUTH AND THROAT: He is wearing a mask, he has multiple coughing spells during his visit.  Hearing is intact to voice. LYMPH NODES:  Lymph nodes in the neck are normal. RESPIRATORY:  Lungs are clear. There is normal respiratory effort, with equal breath sounds bilaterally, and without pathologic use of accessory muscles. CARDIOVASCULAR: Heart is regular without murmurs, gallops, or rubs. GI: The abdomen is  soft, nontender, and nondistended. There are no palpable masses. There is no hepatosplenomegaly. There are normal bowel sounds in all quadrants. GU: Rectal deferred.   MUSCULOSKELETAL: Normal muscle strength and tone. No cyanosis or edema.   SKIN: Turgor is good and there are no pathologic skin lesions or ulcers. NEUROLOGIC: Motor and sensation is grossly normal. Cranial nerves are grossly intact. PSYCH:  Oriented to person, place and time. Affect is normal.  Data Reviewed  I have personally reviewed the patient's  imaging, laboratory findings and medical records.    Assessment/Plan 70 year old male with chronic cough and reflux.  GERD likely contributing to his cough.  Discussed with the patient in detail he does have a known history of hiatal hernia as well.  Given that his work up Has been more than 3 decades overall I do think that we need to start from scratch.  We will obtain a CT scan of the chest, abdomen and pelvis to evaluate for any mediastinal and intra-abdominal anatomy.  We will follow his back barium swallow to assess for esophageal function and he will need endoscopic examination rule out any endoluminal pathology.  Discussed with patient in detail.  After he completes his work-up we will see him back for surgical discussion.  We did discuss with him the role of robotic repair of this paraesophageal hernias.  The role of antireflux procedure.  He is very interested in pursuing further work-up.  Extensive counseling provided.  A copy of this report was sent to the referring provider  Time spent with the patient was 65 minutes, with more than 50% of the time spent in face-to-face education, counseling and care coordination.     Caroleen Hamman, MD FACS General Surgeon 11/20/2020, 4:47 PM

## 2020-11-21 ENCOUNTER — Encounter: Payer: Self-pay | Admitting: Surgery

## 2020-11-22 ENCOUNTER — Other Ambulatory Visit: Payer: Self-pay | Admitting: Surgery

## 2020-11-22 ENCOUNTER — Other Ambulatory Visit: Payer: Self-pay

## 2020-11-22 ENCOUNTER — Telehealth: Payer: Self-pay

## 2020-11-22 ENCOUNTER — Ambulatory Visit
Admission: RE | Admit: 2020-11-22 | Discharge: 2020-11-22 | Disposition: A | Discharge status: Home / Self Care | Payer: Medicare Other | Source: Ambulatory Visit | Attending: Surgery | Admitting: Surgery

## 2020-11-22 DIAGNOSIS — K449 Diaphragmatic hernia without obstruction or gangrene: Secondary | ICD-10-CM | POA: Diagnosis present

## 2020-11-22 NOTE — Telephone Encounter (Signed)
Pt notified of barium swallow results. Verbalizes understanding. Made a f/u appt with Dr. Dahlia Byes for 11/06/20 @ 11 am.

## 2020-11-29 ENCOUNTER — Other Ambulatory Visit: Payer: Self-pay

## 2020-11-29 ENCOUNTER — Ambulatory Visit
Admission: RE | Admit: 2020-11-29 | Discharge: 2020-11-29 | Disposition: A | Discharge status: Home / Self Care | Payer: Medicare Other | Source: Ambulatory Visit | Attending: Surgery | Admitting: Surgery

## 2020-11-29 DIAGNOSIS — K449 Diaphragmatic hernia without obstruction or gangrene: Secondary | ICD-10-CM | POA: Diagnosis not present

## 2020-11-29 LAB — POCT I-STAT CREATININE: Creatinine, Ser: 1 mg/dL (ref 0.61–1.24)

## 2020-11-29 MED ORDER — IOHEXOL 300 MG/ML  SOLN
100.0000 mL | Freq: Once | INTRAMUSCULAR | Status: AC | PRN
  Administered 2020-11-29: 100 mL via INTRAVENOUS

## 2020-12-04 ENCOUNTER — Telehealth: Payer: Self-pay

## 2020-12-04 NOTE — Telephone Encounter (Signed)
Left message for patient to return call to office- CT shows small hernia-keep scheduled appointment 12/06/20 @ 11 am with Dr.Pabon

## 2020-12-04 NOTE — Telephone Encounter (Signed)
Patient notified of results and follow up appointment.

## 2020-12-06 ENCOUNTER — Telehealth: Payer: Self-pay | Admitting: Gastroenterology

## 2020-12-06 ENCOUNTER — Other Ambulatory Visit: Payer: Self-pay

## 2020-12-06 ENCOUNTER — Encounter: Payer: Self-pay | Admitting: Surgery

## 2020-12-06 ENCOUNTER — Ambulatory Visit (INDEPENDENT_AMBULATORY_CARE_PROVIDER_SITE_OTHER): Payer: Medicare Other | Admitting: Surgery

## 2020-12-06 VITALS — BP 145/80 | HR 59 | Temp 98.3°F | Ht 66.0 in | Wt 167.0 lb

## 2020-12-06 DIAGNOSIS — K449 Diaphragmatic hernia without obstruction or gangrene: Secondary | ICD-10-CM

## 2020-12-06 DIAGNOSIS — K219 Gastro-esophageal reflux disease without esophagitis: Secondary | ICD-10-CM | POA: Insufficient documentation

## 2020-12-06 DIAGNOSIS — K2100 Gastro-esophageal reflux disease with esophagitis, without bleeding: Secondary | ICD-10-CM

## 2020-12-06 NOTE — Telephone Encounter (Signed)
Left message on machine to call back  

## 2020-12-06 NOTE — Telephone Encounter (Signed)
Please schedule direct esophageal manometry and 24-hour pH impedance study off PPI at [facility] endoscopy.  Thank you

## 2020-12-06 NOTE — Patient Instructions (Addendum)
Please see your follow up appointment listed below. Please call [facility] today to schedule an appointment . [phone].   Gastric emptying study is scheduled for 02/16/21@ 7:30 am at [facility]. Please do not eat/drink 6 hours prior to having this test. You may call on Wednesday or Thursday of each week to see if they have any cancellations- this test is only done on Fridays. [phone]   Referral placed to [facility] for the  Esophageal manometry. Someone from their office will call to schedule an appointment. I have provided their office number and you may call them today to see if they can schedule the appointment sooner rather than later- [phone].  If all the above test are done prior to July 27th appointment with Dr.Pabon you may call the office and we can move this appointment up to see him sooner.

## 2020-12-06 NOTE — Telephone Encounter (Signed)
Good afternoon, we have received a referral from Dr. Dahlia Byes with [facility] for a ph manometry. Pls contact pt to schedule. Thank you.

## 2020-12-07 ENCOUNTER — Other Ambulatory Visit: Payer: Self-pay

## 2020-12-07 DIAGNOSIS — K219 Gastro-esophageal reflux disease without esophagitis: Secondary | ICD-10-CM

## 2020-12-07 NOTE — Telephone Encounter (Signed)
The pt has returned call and agrees to proceed with Esophageal manometry and 24 hour pH impedance off PPI.  The pt is on plavix will he need to stop?

## 2020-12-07 NOTE — Telephone Encounter (Signed)
The pt has been advised.  Information has been sent to the pt via My Chart.     You have been scheduled for an esophageal manometry at [facility] on 12/27/20 at 830 am. Please arrive 30 minutes prior to your procedure for registration. You will need to go to outpatient registration (1st floor of the hospital) first. Make certain to bring your insurance cards as well as a complete list of medications.  Please remember the following: You should be off all PPI (protonix) medications for 2 weeks prior.  1) Do not take any muscle relaxants, xanax (alprazolam) or ativan for 1 day prior to your test as well as the day of the test.  2) Nothing to eat or drink for 4 hours before your test.  3) Hold all diabetic medications/insulin the morning of the test. You may eat and take your medications after the test.  It will take at least 2 weeks to receive the results of this test from your physician. ------------------------------------------ ABOUT ESOPHAGEAL MANOMETRY Esophageal manometry (muh-NOM-uh-tree) is a test that gauges how well your esophagus works. Your esophagus is the long, muscular tube that connects your throat to your stomach. Esophageal manometry measures the rhythmic muscle contractions (peristalsis) that occur in your esophagus when you swallow. Esophageal manometry also measures the coordination and force exerted by the muscles of your esophagus.  During esophageal manometry, a thin, flexible tube (catheter) that contains sensors is passed through your nose, down your esophagus and into your stomach. Esophageal manometry can be helpful in diagnosing some mostly uncommon disorders that affect your esophagus.  Why it's done Esophageal manometry is used to evaluate the movement (motility) of food through the esophagus and into the stomach. The test measures how well the circular bands of muscle (sphincters) at the top and bottom of your esophagus open and close, as well as the pressure,  strength and pattern of the wave of esophageal muscle contractions that moves food along.  What you can expect Esophageal manometry is an outpatient procedure done without sedation. Most people tolerate it well. You may be asked to change into a hospital gown before the test starts.  During esophageal manometry  . While you are sitting up, a member of your health care team sprays your throat with a numbing medication or puts numbing gel in your nose or both.  . A catheter is guided through your nose into your esophagus. The catheter may be sheathed in a water-filled sleeve. It doesn't interfere with your breathing. However, your eyes may water, and you may gag. You may have a slight nosebleed from irritation.  . After the catheter is in place, you may be asked to lie on your back on an exam table, or you may be asked to remain seated.  . You then swallow small sips of water. As you do, a computer connected to the catheter records the pressure, strength and pattern of your esophageal muscle contractions.  . During the test, you'll be asked to breathe slowly and smoothly, remain as still as possible, and swallow only when you're asked to do so.  . A member of your health care team may move the catheter down into your stomach while the catheter continues its measurements.  . The catheter then is slowly withdrawn. The test usually lasts 20 to 30 minutes.  After esophageal manometry  When your esophageal manometry is complete, you may return to your normal activities  This test typically takes 30-45 minutes to complete. _____________________________________

## 2020-12-08 NOTE — Progress Notes (Signed)
Outpatient Surgical Follow Up  12/08/2020  ANANTH FIALLOS is an 70 y.o. male.   Chief Complaint  Patient presents with  . Follow-up    Hiatal hernia     HPI: Clair Gulling is a 70 year old male following up for a hiatal hernia.  CT scan as well as barium swallow personally reviewed.  There is evidence of a small sliding hiatal hernia.  There is evidence of reflux.  There is also evidence of an enlarged esophagus and likely some dysmotility within the esophagus. He has improved significantly after increasing the dose of PPI but continues to have cough.  He does have reflux he denies any dysphagia.  He also stated that he had a history of delayed gastric emptying. 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 We Had a discussion regarding expectations.  Return to prior activities after potential operation.  Past Medical History:  Diagnosis Date  . Anxiety   . Coronary artery disease   . GERD (gastroesophageal reflux disease)   . High cholesterol   . History of impacted cerumen     Past Surgical History:  Procedure Laterality Date  . HEEL SPUR SURGERY Left   . VASECTOMY      Family History  Problem Relation Age of Onset  . Heart failure Mother   . Heart failure Father     Social History:  reports that he has never smoked. He has never used smokeless tobacco. He reports current alcohol use of about 7.0 standard drinks of alcohol per week. He reports that he does not use drugs.  Allergies:  Allergies  Allergen Reactions  . Citalopram Other (See Comments)    Reaction: Sexual side effects    Medications reviewed.    ROS Full ROS performed and is otherwise negative other than what is stated in HPI   BP (!) 145/80   Pulse (!) 59   Temp 98.3 F (36.8 C) (Oral)   Ht 5\' 6"  (1.676 m)   Wt 167 lb (75.8 kg)   SpO2 98%   BMI 26.95 kg/m   Physical Exam Vitals and nursing note reviewed. Exam conducted with a chaperone present.  Constitutional:       General: He is not in acute distress.    Appearance: He is normal weight.  Eyes:     General: No scleral icterus.       Right eye: No discharge.        Left eye: No discharge.     Extraocular Movements: Extraocular movements intact.  Cardiovascular:     Rate and Rhythm: Normal rate and regular rhythm.  Pulmonary:     Effort: Pulmonary effort is normal. No respiratory distress.     Breath sounds: Normal breath sounds. No stridor.  Abdominal:     General: Abdomen is flat. There is no distension.     Palpations: Abdomen is soft. There is no mass.     Tenderness: There is no abdominal tenderness. There is no guarding or rebound.     Hernia: No hernia is present.  Musculoskeletal:        General: Normal range of motion.     Cervical back: Normal range of motion and neck supple. No rigidity or tenderness.  Skin:    General: Skin is warm and dry.     Capillary Refill: Capillary refill takes less than 2 seconds.  Neurological:     General: No focal deficit present.     Mental  Status: He is alert and oriented to person, place, and time.  Psychiatric:        Mood and Affect: Mood normal.        Behavior: Behavior normal.        Thought Content: Thought content normal.        Judgment: Judgment normal.         Assessment/Plan: Small hiatal hernia with significant reflux and causing chronic cough. Cussed with patient detail about his disease process.  Importance of having endoscopic evaluation.  He did have a history of delayed gastric emptying.  Out of abundance of precaution and because we are entertaining fundoplication I do think it is prudent to do a work-up for gastroparesis.  We will get nuclear scan.  Given the appearance of barium swallow I also think that in this case it is appropriate to continue further work-up with manometry to make sure there is no evidence of achalasia.  This may also change the procedure and may be more inclined to do a partial fundoplication.  Extensive  counseling provided  Greater than 50% of the 45 minutes  visit was spent in counseling/coordination of care   Caroleen Hamman, MD [place] Surgeon

## 2020-12-27 ENCOUNTER — Ambulatory Visit: Payer: Medicare Other | Admitting: Surgery

## 2020-12-27 ENCOUNTER — Ambulatory Visit (HOSPITAL_COMMUNITY)
Admission: RE | Admit: 2020-12-27 | Discharge: 2020-12-27 | Disposition: A | Discharge status: Home / Self Care | Payer: Medicare Other | Attending: Gastroenterology | Admitting: Gastroenterology

## 2020-12-27 ENCOUNTER — Encounter (HOSPITAL_COMMUNITY)
Admission: RE | Disposition: A | Discharge status: Home / Self Care | Payer: Self-pay | Source: Home / Self Care | Attending: Gastroenterology

## 2020-12-27 DIAGNOSIS — R059 Cough, unspecified: Secondary | ICD-10-CM | POA: Diagnosis not present

## 2020-12-27 DIAGNOSIS — R12 Heartburn: Secondary | ICD-10-CM | POA: Diagnosis not present

## 2020-12-27 DIAGNOSIS — K219 Gastro-esophageal reflux disease without esophagitis: Secondary | ICD-10-CM | POA: Diagnosis present

## 2020-12-27 HISTORY — PX: PH IMPEDANCE STUDY: SHX5565

## 2020-12-27 HISTORY — PX: ESOPHAGEAL MANOMETRY: SHX5429

## 2020-12-27 SURGERY — MANOMETRY, ESOPHAGUS
Anesthesia: Choice

## 2020-12-27 MED ORDER — LIDOCAINE VISCOUS HCL 2 % MT SOLN
OROMUCOSAL | Status: AC
  Filled 2020-12-27: qty 15

## 2020-12-27 SURGICAL SUPPLY — 2 items
FACESHIELD LNG OPTICON STERILE (SAFETY) IMPLANT
GLOVE BIO SURGEON STRL SZ8 (GLOVE) ×4 IMPLANT

## 2020-12-27 NOTE — Progress Notes (Signed)
Esophageal Manometry done per protocol. Pt tolerated well with out complication. Ph with impedance done per protocol. Pt tolerated well. Instructions given regarding the study and monitor. Pt verbalized understand and return demonstrated use of monitor. Pt will return tomorrow to have probe removed and monitor downloaded.  

## 2020-12-28 ENCOUNTER — Encounter (HOSPITAL_COMMUNITY): Payer: Self-pay | Admitting: Gastroenterology

## 2020-12-29 ENCOUNTER — Other Ambulatory Visit: Payer: Self-pay

## 2020-12-29 ENCOUNTER — Telehealth: Payer: Self-pay

## 2020-12-29 ENCOUNTER — Encounter
Admission: RE | Admit: 2020-12-29 | Discharge: 2020-12-29 | Disposition: A | Discharge status: Home / Self Care | Payer: Medicare Other | Source: Ambulatory Visit | Attending: Surgery | Admitting: Surgery

## 2020-12-29 DIAGNOSIS — K2100 Gastro-esophageal reflux disease with esophagitis, without bleeding: Secondary | ICD-10-CM | POA: Insufficient documentation

## 2020-12-29 DIAGNOSIS — K449 Diaphragmatic hernia without obstruction or gangrene: Secondary | ICD-10-CM | POA: Diagnosis present

## 2020-12-29 MED ORDER — TECHNETIUM TC 99M SULFUR COLLOID
2.2900 | Freq: Once | INTRAVENOUS | Status: AC
  Administered 2020-12-29: 2.29 via ORAL

## 2020-12-29 NOTE — Telephone Encounter (Signed)
Pt notified about normal emptying test. Verbalizes understanding.

## 2021-01-04 ENCOUNTER — Other Ambulatory Visit: Payer: Self-pay

## 2021-01-05 ENCOUNTER — Other Ambulatory Visit: Payer: Medicare Other

## 2021-01-07 NOTE — Progress Notes (Signed)
Gastroenterology Consultation  Referring Provider:     Dion Body, MD Primary Care Physician:  Dion Body, MD Primary Gastroenterologist:  Dr. Allen Norris     Reason for Consultation:     GERD with a cough        HPI:   Cory Hoover is a 70 y.o. y/o male referred for consultation & management of GERD and a cough by Dr. Dion Body, MD. This patient comes in today after being seen by surgery for possible antireflux surgery.  The patient had said that he had a history of a hiatal hernia and was sent to surgery from pulmonology to consider antireflux surgery to help the cough.  The patient underwent a gastric emptying study that was normal and he had a CT scan of the abdomen that showed:   IMPRESSION: 1. Small type 1 hiatal hernia. There is some oral contrast medium in the distal esophagus likely from either reflux or dysmotility. 2.  Aortic Atherosclerosis (ICD10-I70.0). 3. Chronic pars defects at L5 with 6 mm anterolisthesis of L5 on S1 and resulting bilateral foraminal impingement at L5-S1. 4. Scattered colonic diverticula without findings of active diverticulitis. 5. Bosniak category 1 right-sided cyst. Mild complexity of a 1.5 by 1.2 cm left renal lesion, probably a small complex cyst, but if the patient has hematuria or if otherwise clinically warranted, renal protocol MRI with and without contrast could be used for definitive Bosniak categorization.   The patient also underwent a barium swallow that showed:     IMPRESSION: 1. 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 No focal esophageal abnormality identified.   2. Small hiatal hernia. Moderate gastroesophageal reflux. Standard barium tablet passes normally.   The patient has been sent to [facility] for esophageal pH studies and manometry.   Those studies have been done but he does not have the results of them yet.  He reports that he has been told for years that he has a  hiatal hernia but his cough that is nonstop has been a new development.  He was seen by pulmonology who stated that it is not a lung issue.  The patient denies any unexplained weight loss.  The patient is being considered for antireflux surgery and was sent to me for a upper endoscopy prior to undergoing any surgical intervention.  The patient does report that if he is calm and sitting at his computer without having to talk or move around he usually has less coughing.  Past Medical History:  Diagnosis Date   Anxiety    Coronary artery disease    GERD (gastroesophageal reflux disease)    High cholesterol    History of impacted cerumen     Past Surgical History:  Procedure Laterality Date   ESOPHAGEAL MANOMETRY N/A 12/27/2020   Procedure: ESOPHAGEAL MANOMETRY (EM);  Surgeon: Mauri Pole, MD;  [facility];  Service: Endoscopy;  Laterality: N/A;   HEEL SPUR SURGERY Left    [place] IMPEDANCE STUDY N/A 12/27/2020   Procedure: [place] IMPEDANCE STUDY;  Surgeon: Mauri Pole, MD;  [facility];  Service: Endoscopy;  Laterality: N/A;   VASECTOMY      Prior to Admission medications   Medication Sig Start Date End Date Taking? Authorizing Provider  Acidophilus Lactobacillus CAPS Take by mouth daily.    Yes [provider]  amLODipine (NORVASC) 2.5 MG tablet Take 1 tablet by mouth daily. 07/07/20  Yes [provider]  atorvastatin (LIPITOR) 40 MG tablet Take 60 mg by mouth  daily.  07/18/17  Yes [provider]  clopidogrel (PLAVIX) 75 MG tablet Take 1 tablet by mouth daily. 07/07/20  Yes [provider]  Multiple Vitamin (MULTI-VITAMIN) tablet Take 1 tablet by mouth every other day.    Yes [provider]  pantoprazole (PROTONIX) 20 MG tablet Take 20 mg by mouth daily.  09/02/17  Yes [provider]  tamsulosin (FLOMAX) 0.4 MG CAPS capsule Take 2 capsules (0.8 mg total) by mouth daily. 09/26/20  Yes Billey Co, MD     Family History  Problem Relation Age of Onset   Heart failure Mother    Heart failure Father      Social History   Tobacco Use   Smoking status: Never   Smokeless tobacco: Never  Vaping Use   Vaping Use: Never used  Substance Use Topics   Alcohol use: Yes    Alcohol/week: 7.0 standard drinks    Types: 7 Glasses of wine per week   Drug use: No    Allergies as of 01/08/2021 - Review Complete 01/08/2021  Allergen Reaction Noted   Citalopram Other (See Comments) 06/12/2011    Review of Systems:    All systems reviewed and negative except where noted in HPI.   Physical Exam:  Ht 5\' 6"  (1.676 m)   Wt 169 lb (76.7 kg)   BMI 27.28 kg/m  No LMP for male patient. General:   Alert,  Well-developed, well-nourished, pleasant and cooperative in NAD Head:  Normocephalic and atraumatic. Eyes:  Sclera clear, no icterus.   Conjunctiva pink. Ears:  Normal auditory acuity. Neck:  Supple; no masses or thyromegaly. Lungs:  Respirations even and unlabored.  Clear throughout to auscultation.   No wheezes, crackles, or rhonchi. No acute distress. Heart:  Regular rate and rhythm; no murmurs, clicks, rubs, or gallops. Abdomen:  Normal bowel sounds.  No bruits.  Soft, non-tender and non-distended without masses, hepatosplenomegaly or hernias noted.  No guarding or rebound tenderness.  Negative Carnett sign.   Rectal:  Deferred.  Pulses:  Normal pulses noted. Extremities:  No clubbing or edema.  No cyanosis. Neurologic:  Alert and oriented x3;  grossly normal neurologically. Skin:  Intact without significant lesions or rashes.  No jaundice. Lymph Nodes:  No significant cervical adenopathy. Psych:  Alert and cooperative. Normal mood and affect.  Imaging Studies: NM GASTRIC EMPTYING  Result Date: 12/29/2020 CLINICAL DATA:  Hiatal hernia and GERD EXAM: NUCLEAR MEDICINE GASTRIC EMPTYING SCAN TECHNIQUE: After oral ingestion of radiolabeled meal, sequential abdominal images were obtained for 4  hours. Percentage of activity emptying the stomach was calculated at 1 hour, 2 hour, and 3 hours. RADIOPHARMACEUTICALS:  2.29 mCi Tc-69m sulfur colloid in standardized meal COMPARISON:  CT Nov 29, 2020 FINDINGS: Expected location of the stomach in the left upper quadrant. Ingested meal empties the stomach gradually over the course of the study. 63% emptied at 1 hr ( normal >= 10%) 92% emptied at 2 hr ( normal >= 40%) 97% emptied at 3 hr ( normal >= 70%) IMPRESSION: Normal  gastric emptying study. Electronically Signed   By: Dahlia Bailiff MD   On: 12/29/2020 12:32    Assessment and Plan:   Cory Hoover is a 70 y.o. y/o male who comes in with a history of a chronic cough.  The patient has had multiple imaging studies showing that the esophagus has motility issues including a CT scan and barium swallow.  The patient reports that he has had a hiatal hernia  for many years but only recently started having his episodes with coughing.  The patient has been doing better on Protonix 40 mg twice a day but states he is far from back to normal.  The patient states that this is really impeding his activities of daily living.  He does report that he is awaiting the results of his manometry with pH study done in [place].  Since the patient has had longstanding heartburn and is being considered for antireflux urgent the patient will be set up for an EGD.  The patient has been put on Plavix by his primary care provider and we will contact his primary care provider to see if the Plavix can be stopped prior to the procedure.  It appears that the patient was started on Plavix for possible stroke/TIA.  The patient has been explained the plan agrees with it.    Lucilla Lame, MD. Marval Regal    Note: This dictation was prepared with Dragon dictation along with smaller phrase technology. Any transcriptional errors that result from this process are unintentional.

## 2021-01-07 NOTE — H&P (View-Only) (Signed)
Gastroenterology Consultation  Referring Provider:     Dion Body, MD Primary Care Physician:  Dion Body, MD Primary Gastroenterologist:  Dr. Allen Norris     Reason for Consultation:     GERD with a cough        HPI:   Cory Hoover is a 70 y.o. y/o male referred for consultation & management of GERD and a cough by Dr. Dion Body, MD. This patient comes in today after being seen by surgery for possible antireflux surgery.  The patient had said that he had a history of a hiatal hernia and was sent to surgery from pulmonology to consider antireflux surgery to help the cough.  The patient underwent a gastric emptying study that was normal and he had a CT scan of the abdomen that showed:   IMPRESSION: 1. Small type 1 hiatal hernia. There is some oral contrast medium in the distal esophagus likely from either reflux or dysmotility. 2.  Aortic Atherosclerosis (ICD10-I70.0). 3. Chronic pars defects at L5 with 6 mm anterolisthesis of L5 on S1 and resulting bilateral foraminal impingement at L5-S1. 4. Scattered colonic diverticula without findings of active diverticulitis. 5. Bosniak category 1 right-sided cyst. Mild complexity of a 1.5 by 1.2 cm left renal lesion, probably a small complex cyst, but if the patient has hematuria or if otherwise clinically warranted, renal protocol MRI with and without contrast could be used for definitive Bosniak categorization.   The patient also underwent a barium swallow that showed:     IMPRESSION: 1. 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 No focal esophageal abnormality identified.   2. Small hiatal hernia. Moderate gastroesophageal reflux. Standard barium tablet passes normally.   The patient has been sent to [facility] for esophageal pH studies and manometry.   Those studies have been done but he does not have the results of them yet.  He reports that he has been told for years that he has a  hiatal hernia but his cough that is nonstop has been a new development.  He was seen by pulmonology who stated that it is not a lung issue.  The patient denies any unexplained weight loss.  The patient is being considered for antireflux surgery and was sent to me for a upper endoscopy prior to undergoing any surgical intervention.  The patient does report that if he is calm and sitting at his computer without having to talk or move around he usually has less coughing.  Past Medical History:  Diagnosis Date   Anxiety    Coronary artery disease    GERD (gastroesophageal reflux disease)    High cholesterol    History of impacted cerumen     Past Surgical History:  Procedure Laterality Date   ESOPHAGEAL MANOMETRY N/A 12/27/2020   Procedure: ESOPHAGEAL MANOMETRY (EM);  Surgeon: Mauri Pole, MD;  [facility];  Service: Endoscopy;  Laterality: N/A;   HEEL SPUR SURGERY Left    [place] IMPEDANCE STUDY N/A 12/27/2020   Procedure: [place] IMPEDANCE STUDY;  Surgeon: Mauri Pole, MD;  [facility];  Service: Endoscopy;  Laterality: N/A;   VASECTOMY      Prior to Admission medications   Medication Sig Start Date End Date Taking? Authorizing Provider  Acidophilus Lactobacillus CAPS Take by mouth daily.    Yes [provider]  amLODipine (NORVASC) 2.5 MG tablet Take 1 tablet by mouth daily. 07/07/20  Yes [provider]  atorvastatin (LIPITOR) 40 MG tablet Take 60 mg by mouth  daily.  07/18/17  Yes [provider]  clopidogrel (PLAVIX) 75 MG tablet Take 1 tablet by mouth daily. 07/07/20  Yes [provider]  Multiple Vitamin (MULTI-VITAMIN) tablet Take 1 tablet by mouth every other day.    Yes [provider]  pantoprazole (PROTONIX) 20 MG tablet Take 20 mg by mouth daily.  09/02/17  Yes [provider]  tamsulosin (FLOMAX) 0.4 MG CAPS capsule Take 2 capsules (0.8 mg total) by mouth daily. 09/26/20  Yes Billey Co, MD     Family History  Problem Relation Age of Onset   Heart failure Mother    Heart failure Father      Social History   Tobacco Use   Smoking status: Never   Smokeless tobacco: Never  Vaping Use   Vaping Use: Never used  Substance Use Topics   Alcohol use: Yes    Alcohol/week: 7.0 standard drinks    Types: 7 Glasses of wine per week   Drug use: No    Allergies as of 01/08/2021 - Review Complete 01/08/2021  Allergen Reaction Noted   Citalopram Other (See Comments) 06/12/2011    Review of Systems:    All systems reviewed and negative except where noted in HPI.   Physical Exam:  Ht 5\' 6"  (1.676 m)   Wt 169 lb (76.7 kg)   BMI 27.28 kg/m  No LMP for male patient. General:   Alert,  Well-developed, well-nourished, pleasant and cooperative in NAD Head:  Normocephalic and atraumatic. Eyes:  Sclera clear, no icterus.   Conjunctiva pink. Ears:  Normal auditory acuity. Neck:  Supple; no masses or thyromegaly. Lungs:  Respirations even and unlabored.  Clear throughout to auscultation.   No wheezes, crackles, or rhonchi. No acute distress. Heart:  Regular rate and rhythm; no murmurs, clicks, rubs, or gallops. Abdomen:  Normal bowel sounds.  No bruits.  Soft, non-tender and non-distended without masses, hepatosplenomegaly or hernias noted.  No guarding or rebound tenderness.  Negative Carnett sign.   Rectal:  Deferred.  Pulses:  Normal pulses noted. Extremities:  No clubbing or edema.  No cyanosis. Neurologic:  Alert and oriented x3;  grossly normal neurologically. Skin:  Intact without significant lesions or rashes.  No jaundice. Lymph Nodes:  No significant cervical adenopathy. Psych:  Alert and cooperative. Normal mood and affect.  Imaging Studies: NM GASTRIC EMPTYING  Result Date: 12/29/2020 CLINICAL DATA:  Hiatal hernia and GERD EXAM: NUCLEAR MEDICINE GASTRIC EMPTYING SCAN TECHNIQUE: After oral ingestion of radiolabeled meal, sequential abdominal images were obtained for 4  hours. Percentage of activity emptying the stomach was calculated at 1 hour, 2 hour, and 3 hours. RADIOPHARMACEUTICALS:  2.29 mCi Tc-64m sulfur colloid in standardized meal COMPARISON:  CT Nov 29, 2020 FINDINGS: Expected location of the stomach in the left upper quadrant. Ingested meal empties the stomach gradually over the course of the study. 63% emptied at 1 hr ( normal >= 10%) 92% emptied at 2 hr ( normal >= 40%) 97% emptied at 3 hr ( normal >= 70%) IMPRESSION: Normal  gastric emptying study. Electronically Signed   By: Dahlia Bailiff MD   On: 12/29/2020 12:32    Assessment and Plan:   Cory Hoover is a 70 y.o. y/o male who comes in with a history of a chronic cough.  The patient has had multiple imaging studies showing that the esophagus has motility issues including a CT scan and barium swallow.  The patient reports that he has had a hiatal hernia  for many years but only recently started having his episodes with coughing.  The patient has been doing better on Protonix 40 mg twice a day but states he is far from back to normal.  The patient states that this is really impeding his activities of daily living.  He does report that he is awaiting the results of his manometry with pH study done in [place].  Since the patient has had longstanding heartburn and is being considered for antireflux urgent the patient will be set up for an EGD.  The patient has been put on Plavix by his primary care provider and we will contact his primary care provider to see if the Plavix can be stopped prior to the procedure.  It appears that the patient was started on Plavix for possible stroke/TIA.  The patient has been explained the plan agrees with it.    Lucilla Lame, MD. Marval Regal    Note: This dictation was prepared with Dragon dictation along with smaller phrase technology. Any transcriptional errors that result from this process are unintentional.

## 2021-01-08 ENCOUNTER — Encounter: Payer: Self-pay | Admitting: Gastroenterology

## 2021-01-08 ENCOUNTER — Other Ambulatory Visit: Payer: Self-pay

## 2021-01-08 ENCOUNTER — Ambulatory Visit (INDEPENDENT_AMBULATORY_CARE_PROVIDER_SITE_OTHER): Payer: Medicare Other | Admitting: Gastroenterology

## 2021-01-08 VITALS — BP 131/75 | HR 62 | Temp 98.0°F | Ht 66.0 in | Wt 169.0 lb

## 2021-01-08 DIAGNOSIS — K219 Gastro-esophageal reflux disease without esophagitis: Secondary | ICD-10-CM | POA: Diagnosis not present

## 2021-01-12 ENCOUNTER — Other Ambulatory Visit: Payer: Self-pay

## 2021-01-12 DIAGNOSIS — K219 Gastro-esophageal reflux disease without esophagitis: Secondary | ICD-10-CM

## 2021-01-15 ENCOUNTER — Telehealth: Payer: Self-pay

## 2021-01-15 NOTE — Telephone Encounter (Signed)
Per PCP office they want patient to stop the Plavix 5 days before procedure and then restart it 1 day after the procedure. Patient EGD is on 01/18/2021. He states he has already stopped the Plavix on 01/13/2021 and he will restart it 1 day after the procedure

## 2021-01-18 ENCOUNTER — Ambulatory Visit: Payer: Medicare Other | Admitting: Anesthesiology

## 2021-01-18 ENCOUNTER — Other Ambulatory Visit: Payer: Self-pay

## 2021-01-18 ENCOUNTER — Ambulatory Visit
Admission: RE | Admit: 2021-01-18 | Discharge: 2021-01-18 | Disposition: A | Discharge status: Home / Self Care | Payer: Medicare Other | Attending: Gastroenterology | Admitting: Gastroenterology

## 2021-01-18 ENCOUNTER — Encounter
Admission: RE | Disposition: A | Discharge status: Home / Self Care | Payer: Self-pay | Source: Home / Self Care | Attending: Gastroenterology

## 2021-01-18 ENCOUNTER — Encounter: Payer: Self-pay | Admitting: Gastroenterology

## 2021-01-18 DIAGNOSIS — K2960 Other gastritis without bleeding: Secondary | ICD-10-CM | POA: Insufficient documentation

## 2021-01-18 DIAGNOSIS — Z79899 Other long term (current) drug therapy: Secondary | ICD-10-CM | POA: Insufficient documentation

## 2021-01-18 DIAGNOSIS — Z888 Allergy status to other drugs, medicaments and biological substances status: Secondary | ICD-10-CM | POA: Diagnosis not present

## 2021-01-18 DIAGNOSIS — Z7902 Long term (current) use of antithrombotics/antiplatelets: Secondary | ICD-10-CM | POA: Diagnosis not present

## 2021-01-18 DIAGNOSIS — K449 Diaphragmatic hernia without obstruction or gangrene: Secondary | ICD-10-CM | POA: Diagnosis not present

## 2021-01-18 DIAGNOSIS — K2970 Gastritis, unspecified, without bleeding: Secondary | ICD-10-CM | POA: Diagnosis not present

## 2021-01-18 DIAGNOSIS — K219 Gastro-esophageal reflux disease without esophagitis: Secondary | ICD-10-CM | POA: Diagnosis present

## 2021-01-18 DIAGNOSIS — K2100 Gastro-esophageal reflux disease with esophagitis, without bleeding: Secondary | ICD-10-CM | POA: Insufficient documentation

## 2021-01-18 HISTORY — PX: ESOPHAGOGASTRODUODENOSCOPY (EGD) WITH PROPOFOL: SHX5813

## 2021-01-18 SURGERY — ESOPHAGOGASTRODUODENOSCOPY (EGD) WITH PROPOFOL
Anesthesia: General

## 2021-01-18 MED ORDER — PROPOFOL 500 MG/50ML IV EMUL
INTRAVENOUS | Status: DC | PRN
  Administered 2021-01-18: 200 ug/kg/min via INTRAVENOUS

## 2021-01-18 MED ORDER — SODIUM CHLORIDE 0.9 % IV SOLN: INTRAVENOUS | Status: DC

## 2021-01-18 MED ORDER — PROPOFOL 10 MG/ML IV BOLUS
INTRAVENOUS | Status: DC | PRN
  Administered 2021-01-18: 50 mg via INTRAVENOUS

## 2021-01-18 MED ORDER — LIDOCAINE HCL (PF) 2 % IJ SOLN
INTRAMUSCULAR | Status: DC | PRN
  Administered 2021-01-18: 100 mg via INTRADERMAL

## 2021-01-18 NOTE — Anesthesia Preprocedure Evaluation (Signed)
Anesthesia Evaluation  Patient identified by MRN, date of birth, ID band Patient awake    Reviewed: Allergy & Precautions, H&P , NPO status , Patient's Chart, lab work & pertinent test results, reviewed documented beta blocker date and time   Airway Mallampati: II   Neck ROM: full    Dental  (+) Poor Dentition   Pulmonary neg pulmonary ROS,    Pulmonary exam normal        Cardiovascular Exercise Tolerance: Good + CAD  Normal cardiovascular exam Rhythm:regular Rate:Normal     Neuro/Psych Anxiety negative neurological ROS  negative psych ROS   GI/Hepatic negative GI ROS, Neg liver ROS, GERD  Medicated,  Endo/Other  negative endocrine ROS  Renal/GU negative Renal ROS  negative genitourinary   Musculoskeletal   Abdominal   Peds  Hematology negative hematology ROS (+)   Anesthesia Other Findings Past Medical History: No date: Anxiety No date: Coronary artery disease No date: GERD (gastroesophageal reflux disease) No date: High cholesterol No date: History of impacted cerumen Past Surgical History: 12/27/2020: ESOPHAGEAL MANOMETRY; N/A     Comment:  Procedure: ESOPHAGEAL MANOMETRY (EM);  Surgeon:               Mauri Pole, MD;  [facility];                Service: Endoscopy;  Laterality: N/A; No date: HEEL SPUR SURGERY; Left 12/27/2020: [place] IMPEDANCE STUDY; N/A     Comment:  Procedure: [place] IMPEDANCE STUDY;  Surgeon: Mauri Pole, MD;  [facility];  Service:               Endoscopy;  Laterality: N/A; No date: VASECTOMY   Reproductive/Obstetrics negative OB ROS                             Anesthesia Physical Anesthesia Plan  ASA: 3  Anesthesia Plan: General   Post-op Pain Management:    Induction:   PONV Risk Score and Plan:   Airway Management Planned:   Additional Equipment:   Intra-op Plan:   Post-operative Plan:    Informed Consent: I have reviewed the patients History and Physical, chart, labs and discussed the procedure including the risks, benefits and alternatives for the proposed anesthesia with the patient or authorized representative who has indicated his/her understanding and acceptance.     Dental Advisory Given  Plan Discussed with: CRNA  Anesthesia Plan Comments:         Anesthesia Quick Evaluation

## 2021-01-18 NOTE — Transfer of Care (Signed)
Immediate Anesthesia Transfer of Care Note  Patient: Cory Hoover  Procedure(s) Performed: ESOPHAGOGASTRODUODENOSCOPY (EGD) WITH PROPOFOL  Patient Location: PACU  Anesthesia Type:General  Level of Consciousness: awake and sedated  Airway & Oxygen Therapy: Patient Spontanous Breathing and Patient connected to nasal cannula oxygen  Post-op Assessment: Report given to RN and Post -op Vital signs reviewed and stable  Post vital signs: Reviewed and stable  Last Vitals:  Vitals Value Taken Time  BP 148/99 01/18/21 1012  Temp    Pulse 67 01/18/21 1012  Resp 17 01/18/21 1012  SpO2 98 % 01/18/21 1012  Vitals shown include unvalidated device data.  Last Pain:  Vitals:   01/18/21 0832  TempSrc: Temporal  PainSc: 0-No pain         Complications: No notable events documented.

## 2021-01-18 NOTE — Interval H&P Note (Signed)
Lucilla Lame, MD [facility] [address], [place], [place] [address] Phone:[phone] Fax : [phone]  Primary Care Physician:  Dion Body, MD Primary Gastroenterologist:  Dr. Allen Norris  Pre-Procedure History & Physical: HPI:  Cory Hoover is a 70 y.o. male is here for an endoscopy.   Past Medical History:  Diagnosis Date   Anxiety    Coronary artery disease    GERD (gastroesophageal reflux disease)    High cholesterol    History of impacted cerumen     Past Surgical History:  Procedure Laterality Date   ESOPHAGEAL MANOMETRY N/A 12/27/2020   Procedure: ESOPHAGEAL MANOMETRY (EM);  Surgeon: Mauri Pole, MD;  [facility];  Service: Endoscopy;  Laterality: N/A;   HEEL SPUR SURGERY Left    [place] IMPEDANCE STUDY N/A 12/27/2020   Procedure: [place] IMPEDANCE STUDY;  Surgeon: Mauri Pole, MD;  [facility];  Service: Endoscopy;  Laterality: N/A;   VASECTOMY      Prior to Admission medications   Medication Sig Start Date End Date Taking? Authorizing Provider  Acidophilus Lactobacillus CAPS Take by mouth daily.    Yes [provider]  amLODipine (NORVASC) 2.5 MG tablet Take 1 tablet by mouth daily. 07/07/20  Yes [provider]  atorvastatin (LIPITOR) 40 MG tablet Take 60 mg by mouth daily.  07/18/17  Yes [provider]  pantoprazole (PROTONIX) 20 MG tablet Take 20 mg by mouth daily.  09/02/17  Yes [provider]  tamsulosin (FLOMAX) 0.4 MG CAPS capsule Take 2 capsules (0.8 mg total) by mouth daily. 09/26/20  Yes Billey Co, MD  clopidogrel (PLAVIX) 75 MG tablet Take 1 tablet by mouth daily. 07/07/20   [provider]  Multiple Vitamin (MULTI-VITAMIN) tablet Take 1 tablet by mouth every other day.     [provider]    Allergies as of 01/12/2021 - Review Complete 01/08/2021  Allergen Reaction Noted   Citalopram Other (See Comments) 06/12/2011    Family History  Problem Relation  Age of Onset   Heart failure Mother    Heart failure Father     Social History   Socioeconomic History   Marital status: Married    Spouse name: Not on file   Number of children: Not on file   Years of education: Not on file   Highest education level: Not on file  Occupational History   Not on file  Tobacco Use   Smoking status: Never   Smokeless tobacco: Never  Vaping Use   Vaping Use: Never used  Substance and Sexual Activity   Alcohol use: Yes    Alcohol/week: 7.0 standard drinks    Types: 7 Glasses of wine per week   Drug use: No   Sexual activity: Yes    Birth control/protection: None  Other Topics Concern   Not on file  Social History Narrative   Not on file   Social Determinants of Health   Financial Resource Strain: Not on file  Food Insecurity: Not on file  Transportation Needs: Not on file  Physical Activity: Not on file  Stress: Not on file  Social Connections: Not on file  Intimate Partner Violence: Not on file    Review of Systems: See HPI, otherwise negative ROS  Physical Exam: BP 134/73   Pulse 62   Temp (!) 97.3 F (36.3 C) (Temporal)   Resp 20   Ht 5\' 6"  (1.676 m)   Wt 74.8 kg   SpO2 98%  BMI 26.63 kg/m  General:   Alert,  pleasant and cooperative in NAD Head:  Normocephalic and atraumatic. Neck:  Supple; no masses or thyromegaly. Lungs:  Clear throughout to auscultation.    Heart:  Regular rate and rhythm. Abdomen:  Soft, nontender and nondistended. Normal bowel sounds, without guarding, and without rebound.   Neurologic:  Alert and  oriented x4;  grossly normal neurologically.  Impression/Plan: [name] is here for an endoscopy to be performed for gerd  Risks, benefits, limitations, and alternatives regarding  endoscopy have been reviewed with the patient.  Questions have been answered.  All parties agreeable.   Lucilla Lame, MD  01/18/2021, 9:24 AM

## 2021-01-18 NOTE — Op Note (Signed)
[facility] Gastroenterology Patient Name: Cory Hoover Procedure Date: 01/18/2021 9:56 AM MRN: [id number] Account #: [id number] Date of Birth: 1950-08-02 Admit Type: Outpatient Age: 70 Room: [facility] ENDO ROOM 3 Gender: Male Note Status: Finalized Procedure:             Upper GI endoscopy Indications:           Suspected esophageal reflux Providers:             Lucilla Lame MD, MD Referring MD:          Dion Body (Referring MD) Medicines:             Propofol per Anesthesia Complications:         No immediate complications. Procedure:             Pre-Anesthesia Assessment:                        - Prior to the procedure, a History and Physical was                         performed, and patient medications and allergies were                         reviewed. The patient's tolerance of previous                         anesthesia was also reviewed. The risks and benefits                         of the procedure and the sedation options and risks                         were discussed with the patient. All questions were                         answered, and informed consent was obtained. Prior                         Anticoagulants: The patient has taken no previous                         anticoagulant or antiplatelet agents. ASA Grade                         Assessment: II - A patient with mild systemic disease.                         After reviewing the risks and benefits, the patient                         was deemed in satisfactory condition to undergo the                         procedure.                        After obtaining informed consent, the endoscope was  passed under direct vision. Throughout the procedure,                         the patient's blood pressure, pulse, and oxygen                         saturations were monitored continuously. The Endoscope                         was introduced through the mouth, and  advanced to the                         second part of duodenum. The upper GI endoscopy was                         accomplished without difficulty. The patient tolerated                         the procedure well. Findings:      A medium-sized hiatal hernia was present.      LA Grade A (one or more mucosal breaks less than 5 mm, not extending       between tops of 2 mucosal folds) esophagitis with no bleeding was found       at the gastroesophageal junction.      Diffuse moderate inflammation characterized by erythema was found in the       gastric antrum. Biopsies were taken with a cold forceps for histology.      The examined duodenum was normal. Impression:            - Medium-sized hiatal hernia.                        - LA Grade A reflux esophagitis with no bleeding.                        - Gastritis. Biopsied.                        - Normal examined duodenum. Recommendation:        - Discharge patient to home.                        - Resume previous diet.                        - Continue present medications.                        - Await pathology results. Procedure Code(s):     --- Professional ---                        [phone], Esophagogastroduodenoscopy, flexible,                         transoral; with biopsy, single or multiple Diagnosis Code(s):     --- Professional ---                        K21.00, Gastro-esophageal reflux disease with  esophagitis, without bleeding                        K29.70, Gastritis, unspecified, without bleeding CPT copyright 2019 American Medical Association. All rights reserved. The codes documented in this report are preliminary and upon coder review may  be revised to meet current compliance requirements. Lucilla Lame MD, MD 01/18/2021 10:11:27 AM This report has been signed electronically. Number of Addenda: 0 Note Initiated On: 01/18/2021 9:56 AM Estimated Blood Loss:  Estimated blood loss: none.      [facility]

## 2021-01-19 ENCOUNTER — Encounter: Payer: Self-pay | Admitting: Gastroenterology

## 2021-01-19 LAB — SURGICAL PATHOLOGY

## 2021-01-22 NOTE — Anesthesia Postprocedure Evaluation (Signed)
Anesthesia Post Note  Patient: Cory Hoover  Procedure(s) Performed: ESOPHAGOGASTRODUODENOSCOPY (EGD) WITH PROPOFOL  Patient location during evaluation: PACU Anesthesia Type: General Level of consciousness: awake and alert Pain management: pain level controlled Vital Signs Assessment: post-procedure vital signs reviewed and stable Respiratory status: spontaneous breathing, nonlabored ventilation, respiratory function stable and patient connected to nasal cannula oxygen Cardiovascular status: blood pressure returned to baseline and stable Postop Assessment: no apparent nausea or vomiting Anesthetic complications: no   No notable events documented.   Last Vitals:  Vitals:   01/18/21 1031 01/18/21 1041  BP: (!) 142/87 (!) 152/86  Pulse: 68 73  Resp: 17 18  Temp:    SpO2: 97% 97%    Last Pain:  Vitals:   01/18/21 1041  TempSrc:   PainSc: 0-No pain                 Molli Barrows

## 2021-01-23 ENCOUNTER — Telehealth: Payer: Self-pay

## 2021-01-23 NOTE — Telephone Encounter (Signed)
-----   Message from Lucilla Lame, MD sent at 01/22/2021  1:03 PM EDT ----- Please have the patient come in for a follow up.

## 2021-01-23 NOTE — Telephone Encounter (Signed)
Pt contacted and scheduled for a follow up appt on 02/05/21.

## 2021-01-29 DIAGNOSIS — R059 Cough, unspecified: Secondary | ICD-10-CM

## 2021-01-29 DIAGNOSIS — R12 Heartburn: Secondary | ICD-10-CM

## 2021-01-30 ENCOUNTER — Telehealth: Payer: Self-pay

## 2021-01-30 NOTE — Telephone Encounter (Signed)
Esophageal manometry report faxed to Dr Caroleen Hamman at [facility] [phone].

## 2021-02-05 ENCOUNTER — Other Ambulatory Visit: Payer: Self-pay

## 2021-02-05 ENCOUNTER — Encounter: Payer: Self-pay | Admitting: Gastroenterology

## 2021-02-05 ENCOUNTER — Ambulatory Visit: Payer: Medicare Other | Admitting: Gastroenterology

## 2021-02-05 ENCOUNTER — Ambulatory Visit (INDEPENDENT_AMBULATORY_CARE_PROVIDER_SITE_OTHER): Payer: Medicare Other | Admitting: Gastroenterology

## 2021-02-05 VITALS — BP 132/75 | HR 66 | Temp 97.5°F | Ht 66.0 in | Wt 169.6 lb

## 2021-02-05 DIAGNOSIS — K2100 Gastro-esophageal reflux disease with esophagitis, without bleeding: Secondary | ICD-10-CM | POA: Diagnosis not present

## 2021-02-05 NOTE — Progress Notes (Signed)
Primary Care Physician: Dion Body, MD  Primary Gastroenterologist:  Dr. Lucilla Lame  Chief Complaint  Patient presents with   Follow up EGD results    HPI: Cory Hoover is a 70 y.o. male here for follow-up after having EGD.  The patient an EGD with some esophagitis and a hiatal hernia.  The patient also had esophageal manometry with pH study and impedance.  The patient had a DeMeester score of 26 with normal being less than 14.72 consistent with reflux.  The patient's esophageal manometry showed no gross abnormalities of the esophageal motility.  It did note a hiatal hernia.  The patient states that he has gone from his Protonix 40 mg twice a day to once a day and his cough is much more manageable.  The patient denies any unexplained weight loss fevers chills nausea or vomiting.  Past Medical History:  Diagnosis Date   Anxiety    Coronary artery disease    GERD (gastroesophageal reflux disease)    High cholesterol    History of impacted cerumen     Current Outpatient Medications  Medication Sig Dispense Refill   Acidophilus Lactobacillus CAPS Take by mouth daily.      amLODipine (NORVASC) 2.5 MG tablet Take 1 tablet by mouth daily.     atorvastatin (LIPITOR) 40 MG tablet Take 60 mg by mouth daily.   1   clopidogrel (PLAVIX) 75 MG tablet Take 1 tablet by mouth daily.     Multiple Vitamin (MULTI-VITAMIN) tablet Take 1 tablet by mouth every other day.      pantoprazole (PROTONIX) 40 MG tablet Take 40 mg by mouth 2 (two) times daily.     tamsulosin (FLOMAX) 0.4 MG CAPS capsule Take 2 capsules (0.8 mg total) by mouth daily. 60 capsule 11   No current facility-administered medications for this visit.    Allergies as of 02/05/2021 - Review Complete 02/05/2021  Allergen Reaction Noted   Citalopram Other (See Comments) 06/12/2011    ROS:  General: Negative for anorexia, weight loss, fever, chills, fatigue, weakness. ENT: Negative for hoarseness, difficulty  swallowing , nasal congestion. CV: Negative for chest pain, angina, palpitations, dyspnea on exertion, peripheral edema.  Respiratory: Negative for dyspnea at rest, dyspnea on exertion, cough, sputum, wheezing.  GI: See history of present illness. GU:  Negative for dysuria, hematuria, urinary incontinence, urinary frequency, nocturnal urination.  Endo: Negative for unusual weight change.    Physical Examination:   BP 132/75 (BP Location: Left Arm, Patient Position: Sitting, Cuff Size: Large)   Pulse 66   Temp (!) 97.5 F (36.4 C) (Temporal)   Ht 5\' 6"  (1.676 m)   Wt 169 lb 9.6 oz (76.9 kg)   BMI 27.37 kg/m   General: Well-nourished, well-developed in no acute distress.  Eyes: No icterus. Conjunctivae pink. Neuro: Alert and oriented x 3.  Grossly intact. Skin: Warm and dry, no jaundice.   Psych: Alert and cooperative, normal mood and affect.  Labs:    Imaging Studies: No results found.  Assessment and Plan:   Cory Hoover is a 70 y.o. y/o male who comes in today for follow-up.  The patient's esophageal pH study showed reflux with an increased DeMeester score.  The patient did not have any symptom association probability for cough or reflux.  The patient DeMeester score was 26 which is above the 14.72 which is normal.  The patient has been told that he has reflux and good esophageal motility but I cannot assure him  that antireflux surgery would cure his cough since the SAP did not show a statistical correlation between his symptoms and the times he was having reflux.  The patient will follow up with surgery to discuss antireflux procedures.  The patient has been explained the plan agrees with it.     Lucilla Lame, MD. Marval Regal    Note: This dictation was prepared with Dragon dictation along with smaller phrase technology. Any transcriptional errors that result from this process are unintentional.

## 2021-02-05 NOTE — Progress Notes (Deleted)
    Primary Care Physician: Dion Body, MD  Primary Gastroenterologist:  Dr. Lucilla Lame  No chief complaint on file.   HPI: Cory Hoover is a 70 y.o. male here after having an upper endoscopy with biopsies of stomach for gastritis.  The patient also had a hiatal hernia seen.  The patient's biopsies showed gastric intestinal metaplasia.  The patient is now here for follow-up of the pathology results.  Past Medical History:  Diagnosis Date   Anxiety    Coronary artery disease    GERD (gastroesophageal reflux disease)    High cholesterol    History of impacted cerumen     Current Outpatient Medications  Medication Sig Dispense Refill   Acidophilus Lactobacillus CAPS Take by mouth daily.      amLODipine (NORVASC) 2.5 MG tablet Take 1 tablet by mouth daily.     atorvastatin (LIPITOR) 40 MG tablet Take 60 mg by mouth daily.   1   clopidogrel (PLAVIX) 75 MG tablet Take 1 tablet by mouth daily.     Multiple Vitamin (MULTI-VITAMIN) tablet Take 1 tablet by mouth every other day.      pantoprazole (PROTONIX) 20 MG tablet Take 20 mg by mouth daily.      tamsulosin (FLOMAX) 0.4 MG CAPS capsule Take 2 capsules (0.8 mg total) by mouth daily. 60 capsule 11   No current facility-administered medications for this visit.    Allergies as of 02/05/2021 - Review Complete 01/18/2021  Allergen Reaction Noted   Citalopram Other (See Comments) 06/12/2011    ROS:  General: Negative for anorexia, weight loss, fever, chills, fatigue, weakness. ENT: Negative for hoarseness, difficulty swallowing , nasal congestion. CV: Negative for chest pain, angina, palpitations, dyspnea on exertion, peripheral edema.  Respiratory: Negative for dyspnea at rest, dyspnea on exertion, cough, sputum, wheezing.  GI: See history of present illness. GU:  Negative for dysuria, hematuria, urinary incontinence, urinary frequency, nocturnal urination.  Endo: Negative for unusual weight change.    Physical  Examination:   There were no vitals taken for this visit.  General: Well-nourished, well-developed in no acute distress.  Eyes: No icterus. Conjunctivae pink. Lungs: Clear to auscultation bilaterally. Non-labored. Heart: Regular rate and rhythm, no murmurs rubs or gallops.  Abdomen: Bowel sounds are normal, nontender, nondistended, no hepatosplenomegaly or masses, no abdominal bruits or hernia , no rebound or guarding.   Extremities: No lower extremity edema. No clubbing or deformities. Neuro: Alert and oriented x 3.  Grossly intact. Skin: Warm and dry, no jaundice.   Psych: Alert and cooperative, normal mood and affect.  Labs:  ***  Imaging Studies: No results found.  Assessment and Plan:   Cory Hoover is a 70 y.o. y/o male ***     Lucilla Lame, MD. Marval Regal    Note: This dictation was prepared with Dragon dictation along with smaller phrase technology. Any transcriptional errors that result from this process are unintentional.

## 2021-02-12 ENCOUNTER — Encounter: Payer: Self-pay | Admitting: Surgery

## 2021-02-12 ENCOUNTER — Ambulatory Visit (INDEPENDENT_AMBULATORY_CARE_PROVIDER_SITE_OTHER): Payer: Medicare Other | Admitting: Surgery

## 2021-02-12 ENCOUNTER — Other Ambulatory Visit: Payer: Self-pay

## 2021-02-12 VITALS — BP 149/79 | HR 57 | Temp 98.3°F | Ht 66.0 in | Wt 167.0 lb

## 2021-02-12 DIAGNOSIS — K449 Diaphragmatic hernia without obstruction or gangrene: Secondary | ICD-10-CM | POA: Diagnosis not present

## 2021-02-12 NOTE — Patient Instructions (Addendum)
We have spoken today about repairing your Hiatal Hernia. Your surgery will be scheduled at [facility] with Dr Dahlia Byes.   You will need to stop your Plavix for 7 days prior to surgery.   Plan to be in the hospital for 1-2 days if the minimally invasive surgery is completed without having to make a bigger incision. If the bigger incision is made, you will most likely need to be in the hospital 3-6 days. You will be on a liquid diet for 1-2 days and need to recover for 2 weeks following your surgery prior to doing any of your normal activities. At the 2 week mark, we will see you in the office and if you are doing ok we will advance your diet and activity level as you tolerate.  Please see your Blue (Pre-care) Sheet for more information regarding your surgery.  Our surgery scheduler will call you to verify surgery date and to go over information.   Please call our office with any questions or concerns that you have regarding your surgery and recovery.     Laparoscopic Nissen Fundoplication Laparoscopic Nissen fundoplication is surgery to relieve heartburn and other problems caused by gastric fluids flowing up into your esophagus. The esophagus is the tube that carries food and liquid from your throat to your stomach. Normally, the muscle that sits between your stomach and your esophagus (lower esophageal sphincter or LES) keeps stomach fluids in your stomach. In some people, the LES does not work properly, and stomach fluids flow up into the esophagus. This can happen when part of the stomach bulges through the LES (hiatal hernia). The backward flow of stomach fluids can cause a type of severe and long-standing heartburn that is called gastroesophageal reflux disease (GERD). You may need this surgery if other treatments for GERD have not helped. In a laparoscopic Nissen fundoplication, the upper part of your stomach is wrapped around the lower part of your esophagus to strengthen the LES and prevent reflux. If  you have a hiatal hernia, it will also be repaired with this surgery. The procedure is done through several small incisions in your abdomen. It is performed using a thin, telescopic instrument (laparoscope) and other instruments that can pass through the scope or through other small incisions. Tell a health care provider about: Any allergies you have. All medicines you are taking, including vitamins, herbs, eye drops, creams, and over-the-counter medicines. Any problems you or family members have had with anesthetic medicines. Any blood disorders you have. Any surgeries you have had. Any medical conditions you have. What are the risks? Generally, this is a safe procedure. However, problems may occur, including: Difficulty swallowing (dysphagia). Bloating. Nausea or vomiting. Damage to the lung, causing a collapsed lung. Infection or bleeding. What happens before the procedure? Ask your health care provider about: Changing or stopping your regular medicines. This is especially important if you are taking diabetes medicines or blood thinners. Taking medicines such as aspirin and ibuprofen. These medicines can thin your blood. Do not take these medicines before your procedure if your health care provider asks you not to. Follow your health care provider's instructions about eating or drinking restrictions. Plan to have someone take you home after the procedure. What happens during the procedure? An IV tube will be inserted into one of your veins. It will be used to give you fluids and medicines during the procedure. You will be given a medicine that makes you fall asleep (general anesthetic). Your abdomen will be cleaned  with a germ-killing solution (antiseptic). The surgeon will make a small incision in your abdomen and insert a tube through the incision. Your abdomen will be filled with a gas. This helps the surgeon to see your organs more easily and it makes more space to work. The surgeon  will insert the laparoscope through the incision. The scope has a camera that will send pictures to a monitor in the operating room. The surgeon will make several other small incisions in your abdomen to insert the other instruments that are needed during the procedure. Another instrument (dilator) will be passed through your mouth and down your esophagus into the upper part of your stomach. The dilator will prevent your LES from being closed too tightly during surgery. The surgeon will pass the top portion of your stomach behind the lower part of your esophagus and wrap it all the way around. This will be stitched into place. If you have a hiatal hernia, it will be repaired during this procedure. All instruments will be removed, and your incisions will be closed under your skin with stitches (sutures). Skin adhesive strips may also be used. A bandage (dressing) will be placed on your skin over the incisions. The procedure may vary among health care providers and hospitals. What happens after the procedure? You will be moved to a recovery area. Your blood pressure, heart rate, breathing rate, and blood oxygen level will be monitored often until the medicines you were given have worn off. You will be given pain medicine as needed. Your IV tube will be kept in until you are able to drink fluids. This information is not intended to replace advice given to you by your health care provider. Make sure you discuss any questions you have with your health care provider. Document Released: 08/05/2014 Document Revised: 12/21/2015 Document Reviewed: 03/16/2014 Elsevier Interactive Patient Education  2017 [employer]   Laparoscopic Nissen Fundoplication, Care After Refer to this sheet in the next few weeks. These instructions provide you with information about caring for yourself after your procedure. Your health care provider may also give you more specific instructions. Your treatment has been planned  according to current medical practices, but problems sometimes occur. Call your health care provider if you have any problems or questions after your procedure. What can I expect after the procedure? After the procedure, it is common to have: Difficulty swallowing (dysphagia). Excess gas (bloating). Follow these instructions at home: Medicines  Take medicines only as directed by your health care provider. Do not drive or operate heavy machinery while taking pain medicine. Incision care  There are many different ways to close and cover an incision, including stitches (sutures), skin glue, and adhesive strips. Follow your health care provider's instructions about: Incision care. Bandage (dressing) changes and removal. Incision closure removal. Check your incision areas every day for signs of infection. Watch for: Redness, swelling, or pain. Fluid, blood, or pus. Do not take baths, swim, or use a hot tub until your health care provider approves. Take showers as directed by your health care provider. Eating and drinking  Follow your health care provider's instructions about eating. You may need to follow a liquid-only diet for 2 weeks, followed by a diet of soft foods for 2 weeks. You should return to your usual diet gradually. Drink enough fluid to keep your urine clear or pale yellow. Activity  Return to your normal activities as directed by your health care provider. Ask your health care provider what activities are safe  for you. Avoid strenuous exercise. Do not lift anything that is heavier than 10 lb (4.5 kg). Ask your health care provider when you can: Return to sexual activity. Drive. Go back to work. Contact a health care provider if: You have a fever. Your pain gets worse or is not helped by medicine. You have frequent nausea or vomiting. You have continued abdominal bloating. You have an ongoing (persistent) cough. You have redness, swelling, or pain in any incision  areas. You have fluid, blood, or pus coming from any incisions. Get help right away if: You have trouble breathing. You are unable to swallow. You have persistent vomiting. You have blood in your vomit. You have severe abdominal pain. This information is not intended to replace advice given to you by your health care provider. Make sure you discuss any questions you have with your health care provider. Document Released: 03/07/2004 Document Revised: 12/21/2015 Document Reviewed: 03/16/2014 Elsevier Interactive Patient Education  2017 [place].   Diet After Nissen Fundoplication Surgery This diet information is for patients who have recently had Nissen fundoplication surgery to correct reflux disease or to repair various types of hernias, such as hiatal hernia and intrathoracic stomach. This diet may also be used for other gastrointestinal surgeries, such as Heller myotomy and repair of achalasia. The diet will help control diarrhea, excess gas and swallowing problems, which may occur after this type of surgery. Keeping Your Stomach from Stretching Eat small, frequent meals (six to eight per day). This will help you consume the majority of the nutrients you need without causing your stomach to feel full or distended.  Drinking large amounts of fluids with meals can stretch your stomach. You may drink fluids between meals as often as you like, but limit fluids to 1/2 cup (4 fluid ounces) with meals and one cup (8 fluid ounces) with snacks.  Sit upright while eating and stay upright for 30 minutes after each meal. Gravity can help food move through your digestive tract. Do not lie down after eating. Sit upright for 2 hours after your last meal or snack of the day.  Eat very slowly. Take your time when eating.  Take small bites and chew your food well to help aid in swallowing and digestion.  Avoid crusty breads and sticky, gummy foods, such as bananas, fresh doughy breads, rolls and doughnuts.  These types of foods become sticky and difficult to swallow.  Toasted breads tend to be better tolerated.  Lastly, if you eat sweets, consume them at the end of your meal to avoid a group of symptoms referred to as "dumping syndrome". This describes the rapid emptying of foods from the stomach to the small intestine. Sweetened beverages, candy and desserts move more rapidly and dump quickly into the intestines. This can cause symptoms of nausea, weakness, cold sweats, cramps, diarrhea and dizzy spells.  Avoiding Gas Avoid drinking through a straw. Do not chew gum or tobacco. These actions cause you to swallow air, which produces excess gas in your stomach. Chew with your mouth closed.  Avoid any foods that cause stomach gas and distention. These foods include corn, dried beans, peas, lentils, onions, broccoli, cauliflower and any food from the cabbage family.  Avoid carbonated drinks, alcohol, citrus and tomato products.  When will I be able to eat a soft diet? After Nissen fundoplication surgery, your diet will be advanced slowly by your surgeon. Generally, you will be on a clear liquid diet for the first few meals. Then you  will advance to the full liquid diet for a meal or two and eventually to a Nissen soft diet. Please be aware that each patient's tolerance to food is different. Your doctor will advance your diet depending on how well you progress after surgery. Clear Liquid Diet  The first diet after surgery is the clear liquid diet. It includes the following liquids: Apple juice  Cranberry juice  Grape juice  Chicken broth  Beef broth  Flavored gelatin (Jell-O)  Decaf tea and coffee  Caffeinated beverages are permitted based on tolerance  Popsicles  [place] ice Carbonated drinks (sodas) are not allowed for the first six to eight weeks after surgery. After this time you can try them again in small amounts.  Full Liquid Diet The full liquid diet contains anything on the clear liquid  diet, plus: Milk, soy, rice and almond (no chocolate)  Cream of wheat, cream of rice, grits  Strained creamed soups (no tomato or broccoli)  Vanilla and strawberry-flavored ice cream  Sherbet  Blended, custard styled or whipped yogurt (plain or vanilla only)  Vanilla and butterscotch pudding (no chocolate or coconut)  Nutritional drinks including Ensure, Boost, Carnation Instant Breakfast (no chocolate-flavored) Note: Dairy products, such as milk, ice cream and pudding, may cause diarrhea in some people just after surgery. You may need to avoid milk products. If so, substitute them with lactose-free beverages, such as soy, rice, Lactaid or almond milks.  Nissen Soft Diet Food Category Foods to Choose Foods to Avoid  Beverages Milk, such as, whole, 2%, 1%, non-fat, or skim, soy, rice, almond  Caffeinated and decaf tea and coffee  Powdered drink mixes (in moderation)  Non-citrus juices (apple, grape, cranberry or blends of these)  Fruit nectars  Nutritional drinks including Boost, Ensure, Carnation Instant Breakfast Chocolate milk, cocoa or other chocolate-flavored drinks  Carbonated drinks  Alcohol  Citrus juices like orange, grapefruit, lemon and lime  Breads Pancakes, [place] toast and waffles  Crackers (saltine, butter, soda, graham, Goldfish and Cheese Nips)  Toasted bread Untoasted bread, bagels, [place] and hard rolls, English muffins  Crackers with nuts, seeds, fresh or dried fruit, coconut, or highly seasoned, such as garlic or onion-flavored  Sweet rolls, coffee cake or doughnuts  Cereals Well cooked cereals, such as oatmeal (plain or flavored)  Cold cereal (Cornflakes, Rice Krispies, Cheerios, Special K plain, Rice Chex and puffed rice) Very coarse cereal, such as bran, shredded wheat  Any cereal with fresh or dried fruit, coconut, seeds or nuts  Desserts Eat in moderation and do not eat desserts or sweets by themselves. Plain cakes, cookies and cream-filled pies   Vanilla and butterscotch pudding or custard  Ice cream, ice milk, frozen yogurt and sherbet  Gelatin made from allowed foods  Fruit ices and popsicles Desserts containing chocolate, coconut, nuts, seeds, fresh or dried fruit, peppermint or spearmint  Eggs  Poached, hard boiled or scrambled Fried eggs and highly seasoned eggs (deviled eggs)  Fats Eat in moderation. Butter and margarine  Mayonnaise and vegetable oils  Mildly seasoned cream sauces and gravies  Plain cream cheese  Sour cream Highly seasoned salad dressings, cream sauces and gravies  Bacon, bacon fat, ham fat, lard and salt pork  Fried foods  Nuts  Fruits Fruit juice  Any canned or cooked fruit except those listed in the AVOID column ALL fresh fruits, such as citrus, bananas and pineapple  Canned pineapple  Dried fruits, such as raisins, berries  Fruits with seeds, such as berries, kiwi and figs  Meat, Fish, Poultry, and [employer] may be ground, minced or chopped to ease swallowing and digestion  Tender, well cooked and moist cuts of beef, chicken, [place] and pork  Veal and lamb  Flaky, cooked fish  Canned tuna  Cottage and ricotta cheeses  Mild cheese, such as American, brick, mozzarella and baby Swiss  Creamy peanut butter  Plain custard or blended fruit yogurt  Moist casseroles, such as macaroni & cheese, tuna noodle  Grilled or toasted cheese sandwich Tough meats with a lot of gristle  Fried, highly seasoned, smoked and fatty meat, fish or poultry, such as frankfurters, luncheon meats, sausage, bacon, spare ribs, beef brisket, sardines, anchovies, duck and goose  Chili and other entrees made with pepper or chili pepper  Shellfish  Strongly flavored cheeses, such as sharp cheese, extra sharp cheddar, cheese containing peppers or other seasonings  Crunchy peanut butter  Any yogurt with nuts, seeds, coconut, strawberries or raspberries  Potatoes and Starches Peeled, mashed or boiled white or sweet  potatoes  Oven-baked potatoes without skin  Well cooked white rice, enriched noodles, barley, spaghetti, macaroni and other pastas Fried potatoes, potato skins and potato chips  Hard and soft taco shells  Fried, brown or wild rice  Soups Mildly flavored meat stocks  Cream soups made from allowed foods Highly seasoned soups and tomato based soups, cream soups made with gas producing vegetables, such as broccoli, cauliflower, onion, etc.  Sweets and Snacks Use in moderation and do not eat large amounts of sweets by themselves. Syrup, honey, jelly and seedless jam  Plain hard candies and plain candies made with allowed ingredients  Molasses  Marshmallows  Other candy made from allowed ingredients  Thin pretzels Jam, marmalade and preserves  Chocolate in any form  Any candy containing nuts, coconut, seeds, peppermint, spearmint or dried or fresh fruit  Popcorn, potato chips, tortilla chips  Soft or hard thick pretzels, such as sourdough  Vegetables Well cooked soft vegetables without seeds or skins, such as asparagus tips, beets, carrots, green and wax beans, chopped spinach, tender canned baby peas, squash and pumpkin Raw vegetables, tomatoes, tomato juice, tomato sauce and V-8 juice  Gas producing vegetables, such as broccoli, Brussel sprouts, cabbage, cauliflower, onions, corn, cucumber, green peppers, rutabagas, turnips, radishes and sauerkraut  Dried beans, peas and lentils  Miscellaneous Salt and spices in moderation  Mustard and vinegar in moderation Fried or highly seasoned foods  Coconut and seeds  Pickles and olives  Chili sauces, ketchup, barbecue sauce, horseradish, black pepper, chili powder and onion and garlic seasonings  Any other strongly flavored seasoning, condiment, spice or herb not tolerated  Any food not tolerated

## 2021-02-12 NOTE — Progress Notes (Signed)
Request for Anti-Coagulation clearance so patient may stop Plavix 7 days prior to surgery has been faxed to Dr Raylene Miyamoto office at [facility].

## 2021-02-13 ENCOUNTER — Telehealth: Payer: Self-pay | Admitting: Surgery

## 2021-02-13 ENCOUNTER — Encounter: Payer: Self-pay | Admitting: Surgery

## 2021-02-13 NOTE — Telephone Encounter (Signed)
Outgoing call is made, left message for patient to call.  Please advise patient of Pre-Admission date/time, COVID Testing date and Surgery date.  Surgery Date: 03/01/21 Preadmission Testing Date: 02/21/21 (phone 1p-5p) Covid Testing Date: 02/27/21 @ 8:35 am, patient advised to go to the [place] [facility]   Also patient to call at [phone], between 1-3:00pm the day before surgery, to find out what time to arrive for surgery.

## 2021-02-13 NOTE — H&P (View-Only) (Signed)
Outpatient Surgical Follow Up  02/13/2021  Cory Hoover is an 70 y.o. male.   Chief Complaint  Patient presents with   Follow-up    Hiatal Hernia     HPI: 70 yo male Well-known to me with a history of recalcitrant reflux and chronic cough.  He underwent egd AND  also had esophageal manometry with pH study and impedance.  The patient had a DeMeester score of 26 with normal being less than 14.72 consistent with reflux.  The patient's esophageal manometry showed no gross abnormalities of the esophageal motility.  It did note a hiatal hernia.   He did have a barium swallow as well as a CT scan of the abdomen pelvis that have personally reviewed showing evidence of a small hiatal hernia.  There was evidence of significant reflux.  He also had a gastric emptying study showing normal stomach function. He Is able to perform more than 6 METS of activity without any shortness or chest pain.  He actually played tennis match last seen for 2. Hours 45 min. He does have chronic cough that improved w PPI He does take Plavix daily  Past Medical History:  Diagnosis Date   Anxiety    Coronary artery disease    GERD (gastroesophageal reflux disease)    High cholesterol    History of impacted cerumen     Past Surgical History:  Procedure Laterality Date   ESOPHAGEAL MANOMETRY N/A 12/27/2020   Procedure: ESOPHAGEAL MANOMETRY (EM);  Surgeon: Mauri Pole, MD;  Location: [facility] ENDOSCOPY;  Service: Endoscopy;  Laterality: N/A;   ESOPHAGOGASTRODUODENOSCOPY (EGD) WITH PROPOFOL N/A 01/18/2021   Procedure: ESOPHAGOGASTRODUODENOSCOPY (EGD) WITH PROPOFOL;  Surgeon: Lucilla Lame, MD;  Location[facility] ENDOSCOPY;  Service: Endoscopy;  Laterality: N/A;   HEEL SPUR SURGERY Left    [place] IMPEDANCE STUDY N/A 12/27/2020   Procedure: [place] IMPEDANCE STUDY;  Surgeon: Mauri Pole, MD;  Location: [facility] ENDOSCOPY;  Service: Endoscopy;  Laterality: N/A;   VASECTOMY      Family History  Problem Relation Age of Onset    Heart failure Mother    Heart failure Father     Social History:  reports that he has never smoked. He has never used smokeless tobacco. He reports current alcohol use of about 7.0 standard drinks of alcohol per week. He reports that he does not use drugs.  Allergies:  Allergies  Allergen Reactions   Citalopram Other (See Comments)    Reaction: Sexual side effects    Medications reviewed.  ROS Full ROS performed and is otherwise negative other than what is stated in HPI   BP (!) 149/79   Pulse (!) 57   Temp 98.3 F (36.8 C)   Ht 5\' 6"  (1.676 m)   Wt 167 lb (75.8 kg)   SpO2 97%   BMI 26.95 kg/m   Physical Exam Vitals and nursing note reviewed. Exam conducted with a chaperone present.  Constitutional:      General: He is not in acute distress.    Appearance: Normal appearance. He is normal weight.  Eyes:     General: No scleral icterus.       Right eye: No discharge.        Left eye: No discharge.  Cardiovascular:     Rate and Rhythm: Normal rate and regular rhythm.     Heart sounds: No murmur heard.   No friction rub.  Pulmonary:     Effort: Pulmonary effort is normal. No respiratory distress.  Breath sounds: Normal breath sounds. No stridor. No wheezing or rhonchi.  Abdominal:     General: Abdomen is flat. There is no distension.     Palpations: Abdomen is soft. There is no mass.     Tenderness: There is no abdominal tenderness. There is no guarding or rebound.     Hernia: No hernia is present.  Musculoskeletal:        General: Normal range of motion.     Cervical back: Normal range of motion and neck supple. No rigidity or tenderness.  Skin:    General: Skin is warm and dry.     Capillary Refill: Capillary refill takes less than 2 seconds.  Neurological:     General: No focal deficit present.     Mental Status: He is alert and oriented to person, place, and time.  Psychiatric:        Mood and Affect: Mood normal.        Behavior: Behavior normal.         Thought Content: Thought content normal.        Judgment: Judgment normal.     Assessment/Plan: 70 year old male with recalcitrant reflux and a small hiatal hernia.  Extensive discussion with the patient was held and options reviewed with him.  He wishes to proceed with repair of hiatal hernia and Nissen fundoplication.  Procedure discussed with the patient in detail.  Risks, benefits and possible complications including but not limited to: Bleeding, infection, esophageal bowel injuries, recurrence.  He understands and wished to proceed. He wishes to have his surgery performed early August. Greater than 50% of the 40 minutes  visit was spent in counseling/coordination of care   Caroleen Hamman, MD [facility]

## 2021-02-13 NOTE — Telephone Encounter (Signed)
Received call back from patient.  He is now aware of all dates and information regarding his surgery scheduled for 03/01/21 and verbalized understanding.

## 2021-02-13 NOTE — Progress Notes (Signed)
Outpatient Surgical Follow Up  02/13/2021  Cory Hoover is an 70 y.o. male.   Chief Complaint  Patient presents with   Follow-up    Hiatal Hernia     HPI: 70 yo male Well-known to me with a history of recalcitrant reflux and chronic cough.  He underwent egd AND  also had esophageal manometry with pH study and impedance.  The patient had a DeMeester score of 26 with normal being less than 14.72 consistent with reflux.  The patient's esophageal manometry showed no gross abnormalities of the esophageal motility.  It did note a hiatal hernia.   He did have a barium swallow as well as a CT scan of the abdomen pelvis that have personally reviewed showing evidence of a small hiatal hernia.  There was evidence of significant reflux.  He also had a gastric emptying study showing normal stomach function. He Is able to perform more than 6 METS of activity without any shortness or chest pain.  He actually played tennis match last seen for 2. Hours 45 min. He does have chronic cough that improved w PPI He does take Plavix daily  Past Medical History:  Diagnosis Date   Anxiety    Coronary artery disease    GERD (gastroesophageal reflux disease)    High cholesterol    History of impacted cerumen     Past Surgical History:  Procedure Laterality Date   ESOPHAGEAL MANOMETRY N/A 12/27/2020   Procedure: ESOPHAGEAL MANOMETRY (EM);  Surgeon: Mauri Pole, MD;  Location: [facility] ENDOSCOPY;  Service: Endoscopy;  Laterality: N/A;   ESOPHAGOGASTRODUODENOSCOPY (EGD) WITH PROPOFOL N/A 01/18/2021   Procedure: ESOPHAGOGASTRODUODENOSCOPY (EGD) WITH PROPOFOL;  Surgeon: Lucilla Lame, MD;  Location: [facility] ENDOSCOPY;  Service: Endoscopy;  Laterality: N/A;   HEEL SPUR SURGERY Left    [place] IMPEDANCE STUDY N/A 12/27/2020   Procedure: [place] IMPEDANCE STUDY;  Surgeon: Mauri Pole, MD;  Location: [facility] ENDOSCOPY;  Service: Endoscopy;  Laterality: N/A;   VASECTOMY      Family History  Problem Relation Age of Onset    Heart failure Mother    Heart failure Father     Social History:  reports that he has never smoked. He has never used smokeless tobacco. He reports current alcohol use of about 7.0 standard drinks of alcohol per week. He reports that he does not use drugs.  Allergies:  Allergies  Allergen Reactions   Citalopram Other (See Comments)    Reaction: Sexual side effects    Medications reviewed.  ROS Full ROS performed and is otherwise negative other than what is stated in HPI   BP (!) 149/79   Pulse (!) 57   Temp 98.3 F (36.8 C)   Ht 5\' 6"  (1.676 m)   Wt 167 lb (75.8 kg)   SpO2 97%   BMI 26.95 kg/m   Physical Exam Vitals and nursing note reviewed. Exam conducted with a chaperone present.  Constitutional:      General: He is not in acute distress.    Appearance: Normal appearance. He is normal weight.  Eyes:     General: No scleral icterus.       Right eye: No discharge.        Left eye: No discharge.  Cardiovascular:     Rate and Rhythm: Normal rate and regular rhythm.     Heart sounds: No murmur heard.   No friction rub.  Pulmonary:     Effort: Pulmonary effort is normal. No respiratory distress.  Breath sounds: Normal breath sounds. No stridor. No wheezing or rhonchi.  Abdominal:     General: Abdomen is flat. There is no distension.     Palpations: Abdomen is soft. There is no mass.     Tenderness: There is no abdominal tenderness. There is no guarding or rebound.     Hernia: No hernia is present.  Musculoskeletal:        General: Normal range of motion.     Cervical back: Normal range of motion and neck supple. No rigidity or tenderness.  Skin:    General: Skin is warm and dry.     Capillary Refill: Capillary refill takes less than 2 seconds.  Neurological:     General: No focal deficit present.     Mental Status: He is alert and oriented to person, place, and time.  Psychiatric:        Mood and Affect: Mood normal.        Behavior: Behavior normal.         Thought Content: Thought content normal.        Judgment: Judgment normal.     Assessment/Plan: 70 year old male with recalcitrant reflux and a small hiatal hernia.  Extensive discussion with the patient was held and options reviewed with him.  He wishes to proceed with repair of hiatal hernia and Nissen fundoplication.  Procedure discussed with the patient in detail.  Risks, benefits and possible complications including but not limited to: Bleeding, infection, esophageal bowel injuries, recurrence.  He understands and wished to proceed. He wishes to have his surgery performed early August. Greater than 50% of the 40 minutes  visit was spent in counseling/coordination of care   Caroleen Hamman, MD [place] Surgeon

## 2021-02-14 ENCOUNTER — Telehealth: Payer: Self-pay

## 2021-02-14 NOTE — Progress Notes (Unsigned)
Received clearance from Dr Raylene Miyamoto office. The patient is cleared at low risk for surgery. Ok to hold Plavix.

## 2021-02-14 NOTE — Telephone Encounter (Signed)
Anticoagulant clearance received from Dr.Linthavong -This patient is optimized for low risk-needs to be off plavix 7 days prior to surgery. Patient notified.

## 2021-02-16 ENCOUNTER — Ambulatory Visit: Payer: Medicare Other

## 2021-02-21 ENCOUNTER — Other Ambulatory Visit
Admission: RE | Admit: 2021-02-21 | Discharge: 2021-02-21 | Disposition: A | Discharge status: Home / Self Care | Payer: Medicare Other | Source: Ambulatory Visit | Attending: Surgery | Admitting: Surgery

## 2021-02-21 ENCOUNTER — Other Ambulatory Visit: Payer: Self-pay

## 2021-02-21 ENCOUNTER — Ambulatory Visit: Payer: Medicare Other | Admitting: Surgery

## 2021-02-21 HISTORY — DX: Personal history of other diseases of the digestive system: Z87.19

## 2021-02-21 NOTE — Patient Instructions (Addendum)
Your procedure is scheduled on: 03/01/21  Report to the Registration Desk on the 1st floor of the [facility]. To find out your arrival time, please call [phone] between 1PM - 3PM on: 02/28/21 Report to [facility] on 02/27/21 at 8:30 am for labs and Covid Test.  REMEMBER: Instructions that are not followed completely may result in serious medical risk, up to and including death; or upon the discretion of your surgeon and anesthesiologist your surgery may need to be rescheduled.  Do not eat food after midnight the night before surgery.  No gum chewing, lozengers or hard candies.  You may however, drink CLEAR liquids up to 2 hours before you are scheduled to arrive for your surgery. Do not drink anything within 2 hours of your scheduled arrival time.  Clear liquids include: - water  - apple juice without pulp - gatorade (not RED, PURPLE, OR BLUE) - black coffee or tea (Do NOT add milk or creamers to the coffee or tea) Do NOT drink anything that is not on this list.  TAKE THESE MEDICATIONS THE MORNING OF SURGERY WITH A SIP OF WATER:  - pantoprazole (PROTONIX) 40 MG tablet   Follow recommendations from Cardiologist, Pulmonologist or PCP regarding stopping Aspirin, Coumadin, Plavix, Eliquis, Pradaxa, or Pletal. Stop taking 7 days before your surgery.  One week prior to surgery: Do not take 7 days prior to your procedure. Stop Anti-inflammatories (NSAIDS) such as Advil, Aleve, Ibuprofen, Motrin, Naproxen, Naprosyn and Aspirin based products such as Excedrin, Goodys Powder, BC Powder.  Stop ANY OVER THE COUNTER supplements until after surgery.  You may however, continue to take Tylenol if needed for pain up until the day of surgery.  No Alcohol for 24 hours before or after surgery.  No Smoking including e-cigarettes for 24 hours prior to surgery.  No chewable tobacco products for at least 6 hours prior to surgery.  No nicotine patches on the day of surgery.  Do not use any  "recreational" drugs for at least a week prior to your surgery.  Please be advised that the combination of cocaine and anesthesia may have negative outcomes, up to and including death. If you test positive for cocaine, your surgery will be cancelled.  On the morning of surgery brush your teeth with toothpaste and water, you may rinse your mouth with mouthwash if you wish. Do not swallow any toothpaste or mouthwash.  Do not wear jewelry, make-up, hairpins, clips or nail polish.  Do not wear lotions, powders, or perfumes.   Do not shave body from the neck down 48 hours prior to surgery just in case you cut yourself which could leave a site for infection.  Also, freshly shaved skin may become irritated if using the CHG soap.  Contact lenses, hearing aids and dentures may not be worn into surgery.  Do not bring valuables to the hospital. [facility] is not responsible for any missing/lost belongings or valuables.   Use CHG Soap or wipes as directed on instruction sheet.  Notify your doctor if there is any change in your medical condition (cold, fever, infection).  Wear comfortable clothing (specific to your surgery type) to the hospital.  After surgery, you can help prevent lung complications by doing breathing exercises.  Take deep breaths and cough every 1-2 hours. Your doctor may order a device called an Incentive Spirometer to help you take deep breaths. When coughing or sneezing, hold a pillow firmly against your incision with both hands. This is called "splinting."  Doing this helps protect your incision. It also decreases belly discomfort.  If you are being admitted to the hospital overnight, leave your suitcase in the car. After surgery it may be brought to your room.  If you are being discharged the day of surgery, you will not be allowed to drive home. You will need a responsible adult (18 years or older) to drive you home and stay with you that night.   If you are taking  public transportation, you will need to have a responsible adult (18 years or older) with you. Please confirm with your physician that it is acceptable to use public transportation.   Please call the [facility] Dept. at [phone] if you have any questions about these instructions.  Surgery Visitation Policy:  Patients undergoing a surgery or procedure may have one family member or support person with them as long as that person is not COVID-19 positive or experiencing its symptoms.  That person may remain in the waiting area during the procedure.  Inpatient Visitation:    Visiting hours are 7 a.m. to 8 p.m. Inpatients will be allowed two visitors daily. The visitors may change each day during the patient's stay. No visitors under the age of 43. Any visitor under the age of 33 must be accompanied by an adult. The visitor must pass COVID-19 screenings, use hand sanitizer when entering and exiting the patient's room and wear a mask at all times, including in the patient's room. Patients must also wear a mask when staff or their visitor are in the room. Masking is required regardless of vaccination status.

## 2021-02-22 ENCOUNTER — Encounter: Payer: Self-pay | Admitting: Surgery

## 2021-02-27 ENCOUNTER — Other Ambulatory Visit
Admission: RE | Admit: 2021-02-27 | Discharge: 2021-02-27 | Disposition: A | Discharge status: Home / Self Care | Payer: Medicare Other | Source: Ambulatory Visit | Attending: Surgery | Admitting: Surgery

## 2021-02-27 ENCOUNTER — Encounter: Payer: Self-pay | Admitting: Urgent Care

## 2021-02-27 ENCOUNTER — Other Ambulatory Visit: Payer: Self-pay

## 2021-02-27 DIAGNOSIS — Z01812 Encounter for preprocedural laboratory examination: Secondary | ICD-10-CM | POA: Diagnosis present

## 2021-02-27 DIAGNOSIS — Z20822 Contact with and (suspected) exposure to covid-19: Secondary | ICD-10-CM | POA: Insufficient documentation

## 2021-02-27 LAB — BASIC METABOLIC PANEL
Anion gap: 3 — ABNORMAL LOW (ref 5–15)
BUN: 13 mg/dL (ref 8–23)
CO2: 28 mmol/L (ref 22–32)
Calcium: 9.2 mg/dL (ref 8.9–10.3)
Chloride: 106 mmol/L (ref 98–111)
Creatinine, Ser: 0.98 mg/dL (ref 0.61–1.24)
GFR, Estimated: >60 mL/min (ref >60)
Glucose, Bld: 99 mg/dL (ref 70–99)
Potassium: 3.9 mmol/L (ref 3.5–5.1)
Sodium: 137 mmol/L (ref 135–145)

## 2021-02-27 LAB — CBC
HCT: 37.3 % — ABNORMAL LOW (ref 39.0–52.0)
Hemoglobin: 13.3 g/dL (ref 13.0–17.0)
MCH: 31.4 pg (ref 26.0–34.0)
MCHC: 35.7 g/dL (ref 30.0–36.0)
MCV: 88 fL (ref 80.0–100.0)
Platelets: 230 10*3/uL (ref 150–400)
RBC: 4.24 MIL/uL (ref 4.22–5.81)
RDW: 12.3 % (ref 11.5–15.5)
WBC: 6 10*3/uL (ref 4.0–10.5)
nRBC: 0 % (ref 0.0–0.2)

## 2021-02-27 LAB — TYPE AND SCREEN
ABO/RH(D): A NEG
Antibody Screen: NEGATIVE

## 2021-02-27 LAB — SARS CORONAVIRUS 2 (TAT 6-24 HRS): SARS Coronavirus 2: NEGATIVE

## 2021-03-01 ENCOUNTER — Encounter: Payer: Self-pay | Admitting: Surgery

## 2021-03-01 ENCOUNTER — Observation Stay: Payer: Medicare Other

## 2021-03-01 ENCOUNTER — Observation Stay
Admission: RE | Admit: 2021-03-01 | Discharge: 2021-03-02 | Disposition: A | Discharge status: Home / Self Care | Payer: Medicare Other | Attending: Surgery | Admitting: Surgery

## 2021-03-01 ENCOUNTER — Other Ambulatory Visit: Payer: Self-pay

## 2021-03-01 ENCOUNTER — Encounter
Admission: RE | Disposition: A | Discharge status: Home / Self Care | Payer: Self-pay | Source: Home / Self Care | Attending: Surgery

## 2021-03-01 ENCOUNTER — Ambulatory Visit: Payer: Medicare Other | Admitting: Urgent Care

## 2021-03-01 DIAGNOSIS — R930 Abnormal findings on diagnostic imaging of skull and head, not elsewhere classified: Secondary | ICD-10-CM | POA: Insufficient documentation

## 2021-03-01 DIAGNOSIS — K219 Gastro-esophageal reflux disease without esophagitis: Secondary | ICD-10-CM | POA: Insufficient documentation

## 2021-03-01 DIAGNOSIS — R4182 Altered mental status, unspecified: Secondary | ICD-10-CM | POA: Diagnosis not present

## 2021-03-01 DIAGNOSIS — R296 Repeated falls: Secondary | ICD-10-CM | POA: Diagnosis not present

## 2021-03-01 DIAGNOSIS — Z9181 History of falling: Secondary | ICD-10-CM | POA: Diagnosis not present

## 2021-03-01 DIAGNOSIS — K449 Diaphragmatic hernia without obstruction or gangrene: Principal | ICD-10-CM | POA: Insufficient documentation

## 2021-03-01 DIAGNOSIS — I2510 Atherosclerotic heart disease of native coronary artery without angina pectoris: Secondary | ICD-10-CM | POA: Insufficient documentation

## 2021-03-01 DIAGNOSIS — Z20822 Contact with and (suspected) exposure to covid-19: Secondary | ICD-10-CM | POA: Insufficient documentation

## 2021-03-01 DIAGNOSIS — R071 Chest pain on breathing: Secondary | ICD-10-CM | POA: Insufficient documentation

## 2021-03-01 DIAGNOSIS — Z8719 Personal history of other diseases of the digestive system: Secondary | ICD-10-CM

## 2021-03-01 DIAGNOSIS — Z9889 Other specified postprocedural states: Secondary | ICD-10-CM

## 2021-03-01 DIAGNOSIS — Z7902 Long term (current) use of antithrombotics/antiplatelets: Secondary | ICD-10-CM | POA: Insufficient documentation

## 2021-03-01 DIAGNOSIS — I700 Atherosclerosis of aorta: Secondary | ICD-10-CM | POA: Diagnosis not present

## 2021-03-01 HISTORY — DX: Essential (primary) hypertension: I10

## 2021-03-01 HISTORY — DX: Diverticulosis of intestine, part unspecified, without perforation or abscess without bleeding: K57.90

## 2021-03-01 HISTORY — DX: Male erectile dysfunction, unspecified: N52.9

## 2021-03-01 HISTORY — DX: Hyperlipidemia, unspecified: E78.5

## 2021-03-01 HISTORY — DX: Atherosclerosis of aorta: I70.0

## 2021-03-01 HISTORY — DX: Peripheral vascular disease, unspecified: I73.9

## 2021-03-01 HISTORY — DX: Benign prostatic hyperplasia without lower urinary tract symptoms: N40.0

## 2021-03-01 HISTORY — DX: Long term (current) use of anticoagulants: Z79.01

## 2021-03-01 HISTORY — PX: XI ROBOTIC ASSISTED HIATAL HERNIA REPAIR: SHX6889

## 2021-03-01 LAB — CBC
HCT: 35.2 % — ABNORMAL LOW (ref 39.0–52.0)
Hemoglobin: 12.8 g/dL — ABNORMAL LOW (ref 13.0–17.0)
MCH: 31.9 pg (ref 26.0–34.0)
MCHC: 36.4 g/dL — ABNORMAL HIGH (ref 30.0–36.0)
MCV: 87.8 fL (ref 80.0–100.0)
Platelets: 201 10*3/uL (ref 150–400)
RBC: 4.01 MIL/uL — ABNORMAL LOW (ref 4.22–5.81)
RDW: 12 % (ref 11.5–15.5)
WBC: 12.7 10*3/uL — ABNORMAL HIGH (ref 4.0–10.5)
nRBC: 0 % (ref 0.0–0.2)

## 2021-03-01 LAB — CREATININE, SERUM
Creatinine, Ser: 1.04 mg/dL (ref 0.61–1.24)
GFR, Estimated: >60 mL/min (ref >60)

## 2021-03-01 LAB — ABO/RH: ABO/RH(D): A NEG

## 2021-03-01 SURGERY — REPAIR, HERNIA, HIATAL, ROBOT-ASSISTED
Anesthesia: General

## 2021-03-01 MED ORDER — METHOCARBAMOL 500 MG PO TABS
500.0000 mg | ORAL_TABLET | Freq: Four times a day (QID) | ORAL | Status: DC | PRN
  Administered 2021-03-01: 500 mg via ORAL

## 2021-03-01 MED ORDER — GABAPENTIN 300 MG PO CAPS: 300.0000 mg | ORAL_CAPSULE | ORAL | Status: AC

## 2021-03-01 MED ORDER — ONDANSETRON HCL 4 MG/2ML IJ SOLN: 4.0000 mg | Freq: Four times a day (QID) | INTRAMUSCULAR | Status: DC | PRN

## 2021-03-01 MED ORDER — LACTATED RINGERS IV SOLN: INTRAVENOUS | Status: DC

## 2021-03-01 MED ORDER — DIPHENHYDRAMINE HCL 12.5 MG/5ML PO ELIX
12.5000 mg | ORAL_SOLUTION | Freq: Four times a day (QID) | ORAL | Status: DC | PRN
  Filled 2021-03-01: qty 5

## 2021-03-01 MED ORDER — VISTASEAL 10 ML SINGLE DOSE KIT
PACK | CUTANEOUS | Status: DC | PRN
  Administered 2021-03-01: 10 mL via TOPICAL

## 2021-03-01 MED ORDER — PROPOFOL 10 MG/ML IV BOLUS
INTRAVENOUS | Status: AC
  Filled 2021-03-01: qty 20

## 2021-03-01 MED ORDER — ACETAMINOPHEN 500 MG PO TABS
ORAL_TABLET | ORAL | Status: AC
  Administered 2021-03-01: 1000 mg via ORAL
  Filled 2021-03-01: qty 2

## 2021-03-01 MED ORDER — FENTANYL CITRATE (PF) 100 MCG/2ML IJ SOLN
INTRAMUSCULAR | Status: AC
  Filled 2021-03-01: qty 2

## 2021-03-01 MED ORDER — CEFAZOLIN SODIUM-DEXTROSE 2-4 GM/100ML-% IV SOLN
2.0000 g | Freq: Three times a day (TID) | INTRAVENOUS | Status: AC
  Administered 2021-03-01 – 2021-03-02 (×2): 2 g via INTRAVENOUS
  Filled 2021-03-01 (×2): qty 100

## 2021-03-01 MED ORDER — EPHEDRINE SULFATE 50 MG/ML IJ SOLN
INTRAMUSCULAR | Status: DC | PRN
Start: 2021-03-01 — End: 2021-03-01
  Administered 2021-03-01 (×2): 10 mg via INTRAVENOUS

## 2021-03-01 MED ORDER — FENTANYL CITRATE (PF) 100 MCG/2ML IJ SOLN
INTRAMUSCULAR | Status: DC | PRN
  Administered 2021-03-01 (×2): 50 ug via INTRAVENOUS

## 2021-03-01 MED ORDER — MIDAZOLAM HCL 2 MG/2ML IJ SOLN
INTRAMUSCULAR | Status: DC | PRN
  Administered 2021-03-01: 2 mg via INTRAVENOUS

## 2021-03-01 MED ORDER — ROCURONIUM BROMIDE 10 MG/ML (PF) SYRINGE
PREFILLED_SYRINGE | INTRAVENOUS | Status: AC
  Filled 2021-03-01: qty 10

## 2021-03-01 MED ORDER — SUGAMMADEX SODIUM 200 MG/2ML IV SOLN
INTRAVENOUS | Status: DC | PRN
Start: 2021-03-01 — End: 2021-03-01
  Administered 2021-03-01: 303.2 mg via INTRAVENOUS

## 2021-03-01 MED ORDER — VISTASEAL 10 ML SINGLE DOSE KIT
PACK | CUTANEOUS | Status: AC
  Filled 2021-03-01: qty 10

## 2021-03-01 MED ORDER — ONDANSETRON HCL 4 MG/2ML IJ SOLN: 4.0000 mg | Freq: Once | INTRAMUSCULAR | Status: DC | PRN

## 2021-03-01 MED ORDER — LIDOCAINE HCL (CARDIAC) PF 100 MG/5ML IV SOSY
PREFILLED_SYRINGE | INTRAVENOUS | Status: DC | PRN
  Administered 2021-03-01: 80 mg via INTRAVENOUS

## 2021-03-01 MED ORDER — PROCHLORPERAZINE EDISYLATE 10 MG/2ML IJ SOLN: 5.0000 mg | Freq: Four times a day (QID) | INTRAMUSCULAR | Status: DC | PRN

## 2021-03-01 MED ORDER — DIPHENHYDRAMINE HCL 50 MG/ML IJ SOLN: 12.5000 mg | Freq: Four times a day (QID) | INTRAMUSCULAR | Status: DC | PRN

## 2021-03-01 MED ORDER — CHLORHEXIDINE GLUCONATE 0.12 % MT SOLN: 15.0000 mL | Freq: Once | OROMUCOSAL | Status: AC

## 2021-03-01 MED ORDER — METHOCARBAMOL 500 MG PO TABS
ORAL_TABLET | ORAL | Status: AC
  Filled 2021-03-01: qty 1

## 2021-03-01 MED ORDER — ORAL CARE MOUTH RINSE: 15.0000 mL | Freq: Once | OROMUCOSAL | Status: AC

## 2021-03-01 MED ORDER — BUPIVACAINE LIPOSOME 1.3 % IJ SUSP
INTRAMUSCULAR | Status: AC
  Filled 2021-03-01: qty 20

## 2021-03-01 MED ORDER — BUPIVACAINE LIPOSOME 1.3 % IJ SUSP
INTRAMUSCULAR | Status: DC | PRN
  Administered 2021-03-01: 20 mL

## 2021-03-01 MED ORDER — HYDROMORPHONE HCL 1 MG/ML IJ SOLN
0.5000 mg | INTRAMUSCULAR | Status: AC
  Administered 2021-03-01: 0.5 mg via INTRAVENOUS

## 2021-03-01 MED ORDER — BUPIVACAINE-EPINEPHRINE (PF) 0.25% -1:200000 IJ SOLN
INTRAMUSCULAR | Status: AC
  Filled 2021-03-01: qty 30

## 2021-03-01 MED ORDER — ROCURONIUM BROMIDE 100 MG/10ML IV SOLN
INTRAVENOUS | Status: DC | PRN
  Administered 2021-03-01: 20 mg via INTRAVENOUS
  Administered 2021-03-01: 70 mg via INTRAVENOUS
  Administered 2021-03-01: 30 mg via INTRAVENOUS
  Administered 2021-03-01: 10 mg via INTRAVENOUS

## 2021-03-01 MED ORDER — CELECOXIB 200 MG PO CAPS: 200.0000 mg | ORAL_CAPSULE | ORAL | Status: AC

## 2021-03-01 MED ORDER — GABAPENTIN 300 MG PO CAPS
ORAL_CAPSULE | ORAL | Status: AC
  Administered 2021-03-01: 300 mg via ORAL
  Filled 2021-03-01: qty 1

## 2021-03-01 MED ORDER — OXYCODONE HCL 5 MG/5ML PO SOLN: 5.0000 mg | Freq: Once | ORAL | Status: AC | PRN

## 2021-03-01 MED ORDER — ENOXAPARIN SODIUM 40 MG/0.4ML IJ SOSY
40.0000 mg | PREFILLED_SYRINGE | INTRAMUSCULAR | Status: DC
  Administered 2021-03-02: 40 mg via SUBCUTANEOUS
  Filled 2021-03-01: qty 0.4

## 2021-03-01 MED ORDER — ALPRAZOLAM 0.5 MG PO TABS: 0.5000 mg | ORAL_TABLET | Freq: Three times a day (TID) | ORAL | Status: DC | PRN

## 2021-03-01 MED ORDER — CEFAZOLIN SODIUM-DEXTROSE 2-4 GM/100ML-% IV SOLN
INTRAVENOUS | Status: AC
  Filled 2021-03-01: qty 100

## 2021-03-01 MED ORDER — LIDOCAINE HCL (PF) 2 % IJ SOLN
INTRAMUSCULAR | Status: AC
  Filled 2021-03-01: qty 5

## 2021-03-01 MED ORDER — FENTANYL CITRATE (PF) 100 MCG/2ML IJ SOLN
INTRAMUSCULAR | Status: AC
  Administered 2021-03-01: 25 ug via INTRAVENOUS
  Filled 2021-03-01: qty 2

## 2021-03-01 MED ORDER — FENTANYL CITRATE (PF) 100 MCG/2ML IJ SOLN
INTRAMUSCULAR | Status: AC
  Administered 2021-03-01: 50 ug via INTRAVENOUS
  Filled 2021-03-01: qty 2

## 2021-03-01 MED ORDER — MELATONIN 5 MG PO TABS: 2.5000 mg | ORAL_TABLET | Freq: Every evening | ORAL | Status: DC | PRN

## 2021-03-01 MED ORDER — PHENYLEPHRINE HCL (PRESSORS) 10 MG/ML IV SOLN
INTRAVENOUS | Status: DC | PRN
  Administered 2021-03-01: 100 ug via INTRAVENOUS

## 2021-03-01 MED ORDER — ONDANSETRON 4 MG PO TBDP: 4.0000 mg | ORAL_TABLET | Freq: Four times a day (QID) | ORAL | Status: DC | PRN

## 2021-03-01 MED ORDER — EPHEDRINE 5 MG/ML INJ
INTRAVENOUS | Status: AC
  Filled 2021-03-01: qty 5

## 2021-03-01 MED ORDER — ACETAMINOPHEN 500 MG PO TABS: 1000.0000 mg | ORAL_TABLET | ORAL | Status: AC

## 2021-03-01 MED ORDER — OXYCODONE HCL 5 MG PO TABS
ORAL_TABLET | ORAL | Status: AC
  Filled 2021-03-01: qty 1

## 2021-03-01 MED ORDER — KETOROLAC TROMETHAMINE 15 MG/ML IJ SOLN
15.0000 mg | Freq: Four times a day (QID) | INTRAMUSCULAR | Status: DC
  Administered 2021-03-01 – 2021-03-02 (×2): 15 mg via INTRAVENOUS
  Filled 2021-03-01 (×2): qty 1

## 2021-03-01 MED ORDER — CHLORHEXIDINE GLUCONATE 0.12 % MT SOLN
OROMUCOSAL | Status: AC
  Administered 2021-03-01: 15 mL via OROMUCOSAL
  Filled 2021-03-01: qty 15

## 2021-03-01 MED ORDER — CHLORHEXIDINE GLUCONATE CLOTH 2 % EX PADS
6.0000 | MEDICATED_PAD | Freq: Once | CUTANEOUS | Status: AC
  Administered 2021-03-01: 6 via TOPICAL

## 2021-03-01 MED ORDER — FENTANYL CITRATE (PF) 100 MCG/2ML IJ SOLN
25.0000 ug | INTRAMUSCULAR | Status: DC | PRN
  Administered 2021-03-01: 25 ug via INTRAVENOUS
  Administered 2021-03-01: 50 ug via INTRAVENOUS

## 2021-03-01 MED ORDER — TAMSULOSIN HCL 0.4 MG PO CAPS
0.8000 mg | ORAL_CAPSULE | Freq: Every evening | ORAL | Status: DC
  Administered 2021-03-01: 0.8 mg via ORAL
  Filled 2021-03-01: qty 2

## 2021-03-01 MED ORDER — DEXAMETHASONE SODIUM PHOSPHATE 10 MG/ML IJ SOLN
INTRAMUSCULAR | Status: AC
  Filled 2021-03-01: qty 1

## 2021-03-01 MED ORDER — PROCHLORPERAZINE MALEATE 10 MG PO TABS
10.0000 mg | ORAL_TABLET | Freq: Four times a day (QID) | ORAL | Status: DC | PRN
  Filled 2021-03-01: qty 1

## 2021-03-01 MED ORDER — ONDANSETRON HCL 4 MG/2ML IJ SOLN
INTRAMUSCULAR | Status: AC
  Filled 2021-03-01: qty 2

## 2021-03-01 MED ORDER — CEFAZOLIN SODIUM-DEXTROSE 2-4 GM/100ML-% IV SOLN
2.0000 g | INTRAVENOUS | Status: AC
  Administered 2021-03-01: 2 g via INTRAVENOUS

## 2021-03-01 MED ORDER — HYDROMORPHONE HCL 1 MG/ML IJ SOLN
INTRAMUSCULAR | Status: AC
  Administered 2021-03-01: 0.5 mg via INTRAVENOUS
  Filled 2021-03-01: qty 1

## 2021-03-01 MED ORDER — DEXAMETHASONE SODIUM PHOSPHATE 10 MG/ML IJ SOLN
INTRAMUSCULAR | Status: DC | PRN
  Administered 2021-03-01: 10 mg via INTRAVENOUS

## 2021-03-01 MED ORDER — ACETAMINOPHEN 500 MG PO TABS
1000.0000 mg | ORAL_TABLET | Freq: Four times a day (QID) | ORAL | Status: DC
  Administered 2021-03-01 – 2021-03-02 (×3): 1000 mg via ORAL
  Filled 2021-03-01 (×3): qty 2

## 2021-03-01 MED ORDER — BUPIVACAINE-EPINEPHRINE 0.25% -1:200000 IJ SOLN
INTRAMUSCULAR | Status: DC | PRN
  Administered 2021-03-01: 30 mL

## 2021-03-01 MED ORDER — DEXMEDETOMIDINE (PRECEDEX) IN NS 20 MCG/5ML (4 MCG/ML) IV SYRINGE
PREFILLED_SYRINGE | INTRAVENOUS | Status: DC | PRN
  Administered 2021-03-01: 8 ug via INTRAVENOUS

## 2021-03-01 MED ORDER — MIDAZOLAM HCL 2 MG/2ML IJ SOLN
INTRAMUSCULAR | Status: AC
  Filled 2021-03-01: qty 2

## 2021-03-01 MED ORDER — PROPOFOL 10 MG/ML IV BOLUS
INTRAVENOUS | Status: DC | PRN
Start: 2021-03-01 — End: 2021-03-01
  Administered 2021-03-01: 150 mg via INTRAVENOUS

## 2021-03-01 MED ORDER — CELECOXIB 200 MG PO CAPS
ORAL_CAPSULE | ORAL | Status: AC
  Administered 2021-03-01: 200 mg via ORAL
  Filled 2021-03-01: qty 1

## 2021-03-01 MED ORDER — MORPHINE SULFATE (PF) 2 MG/ML IV SOLN: 2.0000 mg | INTRAVENOUS | Status: DC | PRN

## 2021-03-01 MED ORDER — OXYCODONE HCL 5 MG PO TABS: 5.0000 mg | ORAL_TABLET | ORAL | Status: DC | PRN

## 2021-03-01 MED ORDER — PREGABALIN 50 MG PO CAPS
100.0000 mg | ORAL_CAPSULE | Freq: Three times a day (TID) | ORAL | Status: DC
  Administered 2021-03-01 – 2021-03-02 (×3): 100 mg via ORAL
  Filled 2021-03-01 (×3): qty 2

## 2021-03-01 MED ORDER — OXYCODONE HCL 5 MG PO TABS
5.0000 mg | ORAL_TABLET | Freq: Once | ORAL | Status: AC | PRN
  Administered 2021-03-01: 5 mg via ORAL

## 2021-03-01 MED ORDER — AMLODIPINE BESYLATE 5 MG PO TABS: 2.5000 mg | ORAL_TABLET | Freq: Every evening | ORAL | Status: DC

## 2021-03-01 MED ORDER — HYDROMORPHONE HCL 1 MG/ML IJ SOLN: 0.5000 mg | INTRAMUSCULAR | Status: DC | PRN

## 2021-03-01 MED ORDER — ONDANSETRON HCL 4 MG/2ML IJ SOLN
INTRAMUSCULAR | Status: DC | PRN
  Administered 2021-03-01: 4 mg via INTRAVENOUS

## 2021-03-01 MED ORDER — SODIUM CHLORIDE 0.9 % IV SOLN: INTRAVENOUS | Status: DC

## 2021-03-01 SURGICAL SUPPLY — 64 items
ADH SKN CLS APL DERMABOND .7 (GAUZE/BANDAGES/DRESSINGS) ×1
APL LAPSCP 35 DL APL RGD (MISCELLANEOUS) ×1
APL PRP STRL LF DISP 70% ISPRP (MISCELLANEOUS) ×1
APPLICATOR VISTASEAL 35 (MISCELLANEOUS) ×1 IMPLANT
CANISTER SUCT 1200ML W/VALVE (MISCELLANEOUS) ×1 IMPLANT
CANNULA REDUC XI 12-8 STAPL (CANNULA) ×2
CANNULA REDUCER 12-8 DVNC XI (CANNULA) ×1 IMPLANT
CATH COUDE FOLEY 5CC 14FR (CATHETERS) ×1 IMPLANT
CHLORAPREP W/TINT 26 (MISCELLANEOUS) ×2 IMPLANT
DECANTER SPIKE VIAL GLASS SM (MISCELLANEOUS) ×2 IMPLANT
DEFOGGER SCOPE WARMER CLEARIFY (MISCELLANEOUS) ×2 IMPLANT
DERMABOND ADVANCED (GAUZE/BANDAGES/DRESSINGS) ×1
DERMABOND ADVANCED .7 DNX12 (GAUZE/BANDAGES/DRESSINGS) ×1 IMPLANT
DRAPE 3/4 80X56 (DRAPES) ×1 IMPLANT
DRAPE ARM DVNC X/XI (DISPOSABLE) ×4 IMPLANT
DRAPE COLUMN DVNC XI (DISPOSABLE) ×1 IMPLANT
DRAPE DA VINCI XI ARM (DISPOSABLE) ×8
DRAPE DA VINCI XI COLUMN (DISPOSABLE) ×2
ELECT CAUTERY BLADE 6.4 (BLADE) ×2 IMPLANT
ELECT REM PT RETURN 9FT ADLT (ELECTROSURGICAL) ×2
ELECTRODE REM PT RTRN 9FT ADLT (ELECTROSURGICAL) ×1 IMPLANT
GAUZE 4X4 16PLY ~~LOC~~+RFID DBL (SPONGE) ×1 IMPLANT
GLOVE SURG ENC MOIS LTX SZ7 (GLOVE) ×9 IMPLANT
GOWN STRL REUS W/ TWL LRG LVL3 (GOWN DISPOSABLE) ×4 IMPLANT
GOWN STRL REUS W/TWL LRG LVL3 (GOWN DISPOSABLE) ×8
GRASPER LAPSCPC 5X45 DSP (INSTRUMENTS) ×2 IMPLANT
IRRIGATION STRYKERFLOW (MISCELLANEOUS) IMPLANT
IRRIGATOR STRYKERFLOW (MISCELLANEOUS) ×2
IV NS 1000ML (IV SOLUTION) ×2
IV NS 1000ML BAXH (IV SOLUTION) IMPLANT
KIT PINK PAD W/HEAD ARE REST (MISCELLANEOUS) ×2
KIT PINK PAD W/HEAD ARM REST (MISCELLANEOUS) ×1 IMPLANT
KIT TURNOVER CYSTO (KITS) ×2 IMPLANT
LABEL OR SOLS (LABEL) ×2 IMPLANT
MANIFOLD NEPTUNE II (INSTRUMENTS) ×2 IMPLANT
MESH BIO-A 7X10 SYN MAT (Mesh General) ×1 IMPLANT
NEEDLE HYPO 22GX1.5 SAFETY (NEEDLE) ×2 IMPLANT
OBTURATOR OPTICAL STANDARD 8MM (TROCAR) ×2
OBTURATOR OPTICAL STND 8 DVNC (TROCAR) ×1
OBTURATOR OPTICALSTD 8 DVNC (TROCAR) ×1 IMPLANT
PACK LAP CHOLECYSTECTOMY (MISCELLANEOUS) ×2 IMPLANT
PENCIL ELECTRO HAND CTR (MISCELLANEOUS) ×2 IMPLANT
SEAL CANN UNIV 5-8 DVNC XI (MISCELLANEOUS) ×3 IMPLANT
SEAL XI 5MM-8MM UNIVERSAL (MISCELLANEOUS) ×6
SEALER VESSEL DA VINCI XI (MISCELLANEOUS) ×2
SEALER VESSEL EXT DVNC XI (MISCELLANEOUS) ×1 IMPLANT
SOLUTION ELECTROLUBE (MISCELLANEOUS) ×2 IMPLANT
SPONGE T-LAP 18X18 ~~LOC~~+RFID (SPONGE) ×2 IMPLANT
STAPLER CANNULA SEAL DVNC XI (STAPLE) ×1 IMPLANT
STAPLER CANNULA SEAL XI (STAPLE) ×2
SUT MNCRL 4-0 (SUTURE) ×2
SUT MNCRL 4-0 27XMFL (SUTURE) ×1
SUT SILK 2 0 SH (SUTURE) ×8 IMPLANT
SUT VICRYL 0 AB UR-6 (SUTURE) ×4 IMPLANT
SUT VLOC 90 2/L VL 12 GS22 (SUTURE) ×2 IMPLANT
SUT VLOC 90 S/L VL9 GS22 (SUTURE) ×1 IMPLANT
SUTURE MNCRL 4-0 27XMF (SUTURE) ×1 IMPLANT
SYR 20ML LL LF (SYRINGE) ×2 IMPLANT
SYR 30ML LL (SYRINGE) ×2 IMPLANT
TAPE TRANSPORE STRL 2 31045 (GAUZE/BANDAGES/DRESSINGS) ×2 IMPLANT
TRAY FOLEY SLVR 16FR LF STAT (SET/KITS/TRAYS/PACK) ×2 IMPLANT
TROCAR BALLN GELPORT 12X130M (ENDOMECHANICALS) ×2 IMPLANT
TROCAR XCEL NON-BLD 5MMX100MML (ENDOMECHANICALS) ×2 IMPLANT
TUBING EVAC SMOKE HEATED PNEUM (TUBING) ×2 IMPLANT

## 2021-03-01 NOTE — Op Note (Addendum)
Robotic assisted laparoscopic repair paraesophageal hernia with mesh ( Bio-A ) and Nissen fundoplication   Pre-operative Diagnosis: GERD, hiatal hernia  Post-operative Diagnosis: same  Procedure:  Robotic assisted laparoscopic repair paraesophageal hernia with mesh and Nissen fundoplication   Surgeon: Caroleen Hamman, MD FACS  Assistant: Duard Larsen RNFA . Required due to the complexity of the case and the need for appropriate exposure  Anesthesia: Gen. with endotracheal tube  Findings: Moderate size Sliding hiatal hernia paraesophageal type I ( larger than what appeared on images and endoscopic evaluation) Loose wrap 360 degree over 50 FR Bougie  Estimated Blood Loss: 5cc               Complications: none   Procedure Details  The patient was seen again in the Holding Room. The benefits, complications, treatment options, and expected outcomes were discussed with the patient. The risks of bleeding, infection, recurrence of symptoms, failure to resolve symptoms,  esophageal damage, Dysphagia, bowel injury, any of which could require further surgery were reviewed with the patient. The likelihood of improving the patient's symptoms with return to their baseline status is good.  The patient and/or family concurred with the proposed plan, giving informed consent.  The patient was taken to Operating Room, identified  and the procedure verified.  A Time Out was held and the above information confirmed.  Prior to the induction of general anesthesia, antibiotic prophylaxis was administered. VTE prophylaxis was in place. General endotracheal anesthesia was then administered and tolerated well. After the induction, the abdomen was prepped with Chloraprep and draped in the sterile fashion. The patient was positioned in the supine position.  Cut down technique was used to enter the abdominal cavity and a Hasson trochar was placed after two vicryl stitches were anchored to the fascia.  Pneumoperitoneum was then created with CO2 and tolerated well without any adverse changes in the patient's vital signs.  Three 8-mm ports were placed under direct vision. All skin incisions  were infiltrated with a local anesthetic agent before making the incision and placing the trocars. An additional 5 mm regular laparoscopic port was placed to assist with retraction and exposure.   The patient was positioned  in reverse Trendelenburg, robot was brought to the surgical field and docked in the standard fashion.  We made sure all the instrumentation was kept indirect view at all times and that there were no collision between the arms. I scrubbed out and went to the console.  I used Robotic arm to retract the liver, the vessel sealer on my right hand and a forced bipolar grasper on my left hand.  There is along the extra 5 mm port allow me ample exposure and the ability to perform meticulous dissection  We Started dividing the lesser omentum via the pars flaccida.  We Were able to dissect the lesser curvature of the stomach and  dissected the fundus free from the right and left crus.  We circumferentially dissected the GE junction.  The hernia sac was also completely reduced and we were able to bring the stomach into the intra-abdominal position.  Attention then was turned to the greater curvature where the short gastrics were divided with sealer device.  We were able to identify the left crus and again were able to make sure there was a good circumferential dissection and that the hernia sac was completely excised.  We did perform  good dissection within the mediastinum to allow a complete reduction of the sac and a to completely allow  an intra-abdominal Nissen fundoplication.  2-0V lock suture was inserted and the crus as well as the hernia was closed with a running suture we used two pieces of BioA as pledgets. A 10x7 cm Bio-A Mesh was placed posteriror to the esophagus to reinforce the repair and was  secured using vistaseal.  We Asked anesthesia to place a 50 French bougie and this went easily.  We also observe trajectory of the bougie. 360 degree Nissen fundoplication was created with multiple 2-0 silk sutures and we placed 3 stitches taking some of the esophagus within that bite.  The fundoplication measured approximately 3-1/2 cm and he was floppy. I was very happy with the way the fundoplication laid and the repair of the hernia.  Inspection of the  upper quadrant was performed. No bleeding, bile  Or esophageal injuries leaks, or bowel injuries were noted. Robotic instruments and robotic arms were undocked in the standard fashion. All the needles were removed under direct visualization.   I scrubbed back in.  Pneumoperitoneum was released.  The periumbilical port site was closed with interrumpted 0 Vicryl sutures. 4-0 subcuticular Monocryl was used to close the skin. Liposomal marcaine was injected to all the incisions sites.  Dermabond was  applied.  The patient was then extubated and brought to the recovery room in stable condition. Sponge, lap, and needle counts were correct at closure and at the conclusion of the case.               Caroleen Hamman, MD, FACS

## 2021-03-01 NOTE — Interval H&P Note (Signed)
History and Physical Interval Note:  03/01/2021 10:47 AM  [name]  has presented today for surgery, with the diagnosis of hiatal hernia.  The various methods of treatment have been discussed with the patient and family. After consideration of risks, benefits and other options for treatment, the patient has consented to  Procedure(s): XI ROBOTIC [place], RNFA to assist (N/A) as a surgical intervention.  The patient's history has been reviewed, patient examined, no change in status, stable for surgery.  I have reviewed the patient's chart and labs.  Questions were answered to the patient's satisfaction.     [place]

## 2021-03-01 NOTE — Transfer of Care (Signed)
Immediate Anesthesia Transfer of Care Note  Patient: Cory Hoover  Procedure(s) Performed: XI ROBOTIC ASSISTED HIATAL HERNIA REPAIR WITH MESH RNFA to assist  Patient Location: PACU  Anesthesia Type:General  Level of Consciousness: awake and alert Pt moaning upon arrival to PACU. Pain medicine and precidex administered  Airway & Oxygen Therapy: Patient Spontanous Breathing and Patient connected to face mask oxygen  Post-op Assessment: Report given to RN and Post -op Vital signs reviewed and stable  Post vital signs: Reviewed and stable  Last Vitals:  Vitals Value Taken Time  BP 145/61 03/01/21 1415  Temp 36.2 C 03/01/21 1407  Pulse 94 03/01/21 1419  Resp 24 03/01/21 1419  SpO2 98 % 03/01/21 1419  Vitals shown include unvalidated device data.  Last Pain:  Vitals:   03/01/21 1049  TempSrc: Temporal  PainSc: 0-No pain         Complications: No notable events documented.

## 2021-03-01 NOTE — Anesthesia Postprocedure Evaluation (Signed)
Anesthesia Post Note  Patient: Cory Hoover  Procedure(s) Performed: XI ROBOTIC ASSISTED HIATAL HERNIA REPAIR WITH MESH RNFA to assist  Patient location during evaluation: PACU Anesthesia Type: General Level of consciousness: awake and alert Pain management: pain level controlled Vital Signs Assessment: post-procedure vital signs reviewed and stable Respiratory status: spontaneous breathing, nonlabored ventilation, respiratory function stable and patient connected to nasal cannula oxygen (small apical PTX, <5%) Cardiovascular status: blood pressure returned to baseline and stable Postop Assessment: no apparent nausea or vomiting Anesthetic complications: no   No notable events documented.   Last Vitals:  Vitals:   03/01/21 1545 03/01/21 1600  BP: 122/69 135/66  Pulse: 76 74  Resp: 17 18  Temp: (!) 36.1 C   SpO2: 93% 94%    Last Pain:  Vitals:   03/01/21 1551  TempSrc:   PainSc: 5                  Arita Miss

## 2021-03-01 NOTE — Interval H&P Note (Signed)
History and Physical Interval Note:  03/01/2021 10:46 AM  Cory Hoover  has presented today for surgery, with the diagnosis of hiatal hernia.  The various methods of treatment have been discussed with the patient and family. After consideration of risks, benefits and other options for treatment, the patient has consented to  Procedure(s): XI ROBOTIC [place], RNFA to assist (N/A) as a surgical intervention.  The patient's history has been reviewed, patient examined, no change in status, stable for surgery.  I have reviewed the patient's chart and labs.  Questions were answered to the patient's satisfaction.     [place]

## 2021-03-01 NOTE — Anesthesia Procedure Notes (Signed)
Procedure Name: Intubation Date/Time: 03/01/2021 11:21 AM Performed by: Tollie Eth, CRNA Pre-anesthesia Checklist: Patient identified, Patient being monitored, Timeout performed, Emergency Drugs available and Suction available Patient Re-evaluated:Patient Re-evaluated prior to induction Oxygen Delivery Method: Circle system utilized Preoxygenation: Pre-oxygenation with 100% oxygen Induction Type: IV induction Ventilation: Mask ventilation without difficulty Laryngoscope Size: Mac and 4 Grade View: Grade I Tube type: Oral Tube size: 7.0 mm Number of attempts: 1 Airway Equipment and Method: Stylet and Video-laryngoscopy Placement Confirmation: ETT inserted through vocal cords under direct vision, positive ETCO2 and breath sounds checked- equal and bilateral Secured at: 21 cm Tube secured with: Tape Dental Injury: Teeth and Oropharynx as per pre-operative assessment

## 2021-03-01 NOTE — Interval H&P Note (Signed)
History and Physical Interval Note:  03/01/2021 10:48 AM  Cory Hoover  has presented today for surgery, with the diagnosis of hiatal hernia.  The various methods of treatment have been discussed with the patient and family. After consideration of risks, benefits and other options for treatment, the patient has consented to  Procedure(s): XI ROBOTIC [place], RNFA to assist (N/A) as a surgical intervention.  The patient's history has been reviewed, patient examined, no change in status, stable for surgery.  I have reviewed the patient's chart and labs.  Questions were answered to the patient's satisfaction.     [place]

## 2021-03-01 NOTE — H&P (Signed)
  Preoperative Review   Patient is met in the preoperative holding area. The history is reviewed in the chart and with the patient. I personally reviewed the options and rationale as well as the risks of this procedure that have been previously discussed with the patient. All questions asked by the patient and/or family were answered to their satisfaction.  Patient agrees to proceed with this procedure at this time.  Sachiko Methot M.D. FACS   

## 2021-03-01 NOTE — Anesthesia Preprocedure Evaluation (Signed)
Anesthesia Evaluation  Patient identified by MRN, date of birth, ID band Patient awake    Reviewed: Allergy & Precautions, NPO status , Patient's Chart, lab work & pertinent test results  History of Anesthesia Complications Negative for: history of anesthetic complications  Airway Mallampati: II  TM Distance: >3 FB Neck ROM: Full    Dental no notable dental hx. (+) Teeth Intact   Pulmonary neg pulmonary ROS, neg sleep apnea, neg COPD, Patient abstained from smoking.Not current smoker,    Pulmonary exam normal breath sounds clear to auscultation       Cardiovascular Exercise Tolerance: Good METShypertension, + CAD and + Peripheral Vascular Disease  (-) Past MI (-) dysrhythmias  Rhythm:Regular Rate:Normal - Systolic murmurs    Neuro/Psych PSYCHIATRIC DISORDERS Anxiety TIA   GI/Hepatic hiatal hernia, GERD  Poorly Controlled and Medicated,(+)     (-) substance abuse  ,   Endo/Other  neg diabetes  Renal/GU negative Renal ROS     Musculoskeletal   Abdominal   Peds  Hematology   Anesthesia Other Findings Past Medical History: No date: Anxiety No date: Aortic atherosclerosis (HCC) No date: BPH (benign prostatic hyperplasia) No date: Coronary artery disease No date: Current use of long term anticoagulation     Comment:  Clopidogrel No date: Diverticulosis No date: ED (erectile dysfunction) No date: GERD (gastroesophageal reflux disease) No date: History of hiatal hernia No date: History of impacted cerumen No date: HLD (hyperlipidemia) No date: HTN (hypertension) No date: PVD (peripheral vascular disease) (HCC)  Reproductive/Obstetrics                             Anesthesia Physical Anesthesia Plan  ASA: 3  Anesthesia Plan: General   Post-op Pain Management:    Induction: Intravenous and Rapid sequence  PONV Risk Score and Plan: 4 or greater and Ondansetron, Dexamethasone and  Treatment may vary due to age or medical condition  Airway Management Planned: Oral ETT  Additional Equipment: None  Intra-op Plan:   Post-operative Plan: Extubation in OR  Informed Consent: I have reviewed the patients History and Physical, chart, labs and discussed the procedure including the risks, benefits and alternatives for the proposed anesthesia with the patient or authorized representative who has indicated his/her understanding and acceptance.     Dental advisory given  Plan Discussed with: CRNA and Surgeon  Anesthesia Plan Comments: (Discussed risks of anesthesia with patient, including PONV, sore throat, lip/dental damage. Rare risks discussed as well, such as cardiorespiratory and neurological sequelae, and allergic reactions. Patient understands.)        Anesthesia Quick Evaluation

## 2021-03-01 NOTE — Discharge Instructions (Signed)
In addition to included general post-operative instructions,  Diet: Recommend following Nissen diet recommendations for 4 weeks; handouts provided.    Activity: No heavy lifting >20 pounds (children, pets, laundry, garbage) or strenuous activity for 4 weeks, but light activity and walking are encouraged. Do not drive or drink alcohol if taking narcotic pain medications or having pain that might distract from driving.  Wound care: 2 days after surgery (08/06), you may shower/get incision wet with soapy water and pat dry (do not rub incisions), but no baths or submerging incision underwater until follow-up.   Medications: Resume all home medications. For mild to moderate pain: acetaminophen (Tylenol) or ibuprofen/naproxen (if no kidney disease). Combining Tylenol with alcohol can substantially increase your risk of causing liver disease. Narcotic pain medications, if prescribed, can be used for severe pain, though may cause nausea, constipation, and drowsiness. Do not combine Tylenol and Percocet (or similar) within a 6 hour period as Percocet (and similar) contain(s) Tylenol. If you do not need the narcotic pain medication, you do not need to fill the prescription.  Call office [phone] / [phone]) at any time if any questions, worsening pain, fevers/chills, bleeding, drainage from incision site, or other concerns.

## 2021-03-02 ENCOUNTER — Emergency Department: Payer: Medicare Other

## 2021-03-02 ENCOUNTER — Observation Stay
Admission: EM | Admit: 2021-03-02 | Discharge: 2021-03-03 | Disposition: A | Discharge status: Home / Self Care | Payer: Medicare Other | Attending: Hospitalist | Admitting: Hospitalist

## 2021-03-02 ENCOUNTER — Other Ambulatory Visit: Payer: Self-pay

## 2021-03-02 ENCOUNTER — Observation Stay: Payer: Medicare Other

## 2021-03-02 ENCOUNTER — Encounter: Payer: Self-pay | Admitting: Surgery

## 2021-03-02 DIAGNOSIS — I10 Essential (primary) hypertension: Secondary | ICD-10-CM | POA: Diagnosis not present

## 2021-03-02 DIAGNOSIS — E860 Dehydration: Secondary | ICD-10-CM | POA: Diagnosis not present

## 2021-03-02 DIAGNOSIS — R443 Hallucinations, unspecified: Secondary | ICD-10-CM

## 2021-03-02 DIAGNOSIS — Z79899 Other long term (current) drug therapy: Secondary | ICD-10-CM | POA: Diagnosis not present

## 2021-03-02 DIAGNOSIS — R531 Weakness: Secondary | ICD-10-CM

## 2021-03-02 DIAGNOSIS — R7401 Elevation of levels of liver transaminase levels: Secondary | ICD-10-CM | POA: Diagnosis not present

## 2021-03-02 DIAGNOSIS — Z20822 Contact with and (suspected) exposure to covid-19: Secondary | ICD-10-CM | POA: Diagnosis not present

## 2021-03-02 DIAGNOSIS — Z7901 Long term (current) use of anticoagulants: Secondary | ICD-10-CM | POA: Diagnosis not present

## 2021-03-02 DIAGNOSIS — E785 Hyperlipidemia, unspecified: Secondary | ICD-10-CM | POA: Diagnosis not present

## 2021-03-02 DIAGNOSIS — K449 Diaphragmatic hernia without obstruction or gangrene: Secondary | ICD-10-CM | POA: Diagnosis not present

## 2021-03-02 DIAGNOSIS — R7989 Other specified abnormal findings of blood chemistry: Secondary | ICD-10-CM

## 2021-03-02 DIAGNOSIS — Z7902 Long term (current) use of antithrombotics/antiplatelets: Secondary | ICD-10-CM | POA: Diagnosis not present

## 2021-03-02 DIAGNOSIS — R296 Repeated falls: Secondary | ICD-10-CM | POA: Diagnosis present

## 2021-03-02 DIAGNOSIS — I2510 Atherosclerotic heart disease of native coronary artery without angina pectoris: Secondary | ICD-10-CM | POA: Diagnosis not present

## 2021-03-02 DIAGNOSIS — Z8673 Personal history of transient ischemic attack (TIA), and cerebral infarction without residual deficits: Secondary | ICD-10-CM | POA: Diagnosis not present

## 2021-03-02 LAB — BASIC METABOLIC PANEL
Anion gap: 8 (ref 5–15)
BUN: 17 mg/dL (ref 8–23)
CO2: 22 mmol/L (ref 22–32)
Calcium: 8.8 mg/dL — ABNORMAL LOW (ref 8.9–10.3)
Chloride: 105 mmol/L (ref 98–111)
Creatinine, Ser: 1.02 mg/dL (ref 0.61–1.24)
GFR, Estimated: >60 mL/min (ref >60)
Glucose, Bld: 132 mg/dL — ABNORMAL HIGH (ref 70–99)
Potassium: 4.4 mmol/L (ref 3.5–5.1)
Sodium: 135 mmol/L (ref 135–145)

## 2021-03-02 LAB — MAGNESIUM
Magnesium: 2 mg/dL (ref 1.7–2.4)
Magnesium: 3.9 mg/dL — ABNORMAL HIGH (ref 1.7–2.4)

## 2021-03-02 LAB — CBC WITH DIFFERENTIAL/PLATELET
Abs Immature Granulocytes: 0.04 10*3/uL (ref 0.00–0.07)
Basophils Absolute: 0 10*3/uL (ref 0.0–0.1)
Basophils Relative: 0 %
Eosinophils Absolute: 0.1 10*3/uL (ref 0.0–0.5)
Eosinophils Relative: 0 %
HCT: 35.4 % — ABNORMAL LOW (ref 39.0–52.0)
Hemoglobin: 12.6 g/dL — ABNORMAL LOW (ref 13.0–17.0)
Immature Granulocytes: 0 %
Lymphocytes Relative: 10 %
Lymphs Abs: 1.1 10*3/uL (ref 0.7–4.0)
MCH: 31.3 pg (ref 26.0–34.0)
MCHC: 35.6 g/dL (ref 30.0–36.0)
MCV: 88.1 fL (ref 80.0–100.0)
Monocytes Absolute: 0.7 10*3/uL (ref 0.1–1.0)
Monocytes Relative: 6 %
Neutro Abs: 9.7 10*3/uL — ABNORMAL HIGH (ref 1.7–7.7)
Neutrophils Relative %: 84 %
Platelets: 207 10*3/uL (ref 150–400)
RBC: 4.02 MIL/uL — ABNORMAL LOW (ref 4.22–5.81)
RDW: 12.5 % (ref 11.5–15.5)
WBC: 11.6 10*3/uL — ABNORMAL HIGH (ref 4.0–10.5)
nRBC: 0 % (ref 0.0–0.2)

## 2021-03-02 LAB — CBC
HCT: 33.1 % — ABNORMAL LOW (ref 39.0–52.0)
Hemoglobin: 12 g/dL — ABNORMAL LOW (ref 13.0–17.0)
MCH: 31.6 pg (ref 26.0–34.0)
MCHC: 36.3 g/dL — ABNORMAL HIGH (ref 30.0–36.0)
MCV: 87.1 fL (ref 80.0–100.0)
Platelets: 198 10*3/uL (ref 150–400)
RBC: 3.8 MIL/uL — ABNORMAL LOW (ref 4.22–5.81)
RDW: 12.2 % (ref 11.5–15.5)
WBC: 14.8 10*3/uL — ABNORMAL HIGH (ref 4.0–10.5)
nRBC: 0 % (ref 0.0–0.2)

## 2021-03-02 LAB — COMPREHENSIVE METABOLIC PANEL
ALT: 136 U/L — ABNORMAL HIGH (ref 0–44)
AST: 124 U/L — ABNORMAL HIGH (ref 15–41)
Albumin: 4.3 g/dL (ref 3.5–5.0)
Alkaline Phosphatase: 73 U/L (ref 38–126)
Anion gap: 6 (ref 5–15)
BUN: 20 mg/dL (ref 8–23)
CO2: 24 mmol/L (ref 22–32)
Calcium: 8.9 mg/dL (ref 8.9–10.3)
Chloride: 105 mmol/L (ref 98–111)
Creatinine, Ser: 0.92 mg/dL (ref 0.61–1.24)
GFR, Estimated: >60 mL/min (ref >60)
Glucose, Bld: 122 mg/dL — ABNORMAL HIGH (ref 70–99)
Potassium: 4.1 mmol/L (ref 3.5–5.1)
Sodium: 135 mmol/L (ref 135–145)
Total Bilirubin: 1.1 mg/dL (ref 0.3–1.2)
Total Protein: 7.3 g/dL (ref 6.5–8.1)

## 2021-03-02 MED ORDER — METHOCARBAMOL 500 MG PO TABS: 500.0000 mg | ORAL_TABLET | Freq: Three times a day (TID) | ORAL | 0 refills | Status: DC | PRN

## 2021-03-02 MED ORDER — LORAZEPAM 1 MG PO TABS
1.0000 mg | ORAL_TABLET | Freq: Once | ORAL | Status: AC
  Administered 2021-03-02: 1 mg via ORAL
  Filled 2021-03-02: qty 1

## 2021-03-02 MED ORDER — ACETAMINOPHEN 325 MG PO TABS: 650.0000 mg | ORAL_TABLET | Freq: Four times a day (QID) | ORAL | Status: DC | PRN

## 2021-03-02 MED ORDER — OXYCODONE HCL 5 MG PO TABS: 5.0000 mg | ORAL_TABLET | ORAL | 0 refills | Status: DC | PRN

## 2021-03-02 MED ORDER — ACETAMINOPHEN 650 MG RE SUPP: 650.0000 mg | Freq: Four times a day (QID) | RECTAL | Status: DC | PRN

## 2021-03-02 MED ORDER — PREGABALIN 100 MG PO CAPS: 100.0000 mg | ORAL_CAPSULE | Freq: Three times a day (TID) | ORAL | 0 refills | Status: DC

## 2021-03-02 NOTE — ED Notes (Signed)
Patient returned to ED from MRI. Patient denies needs at this time. Pending admission and potential wait for inpatient bed assignment discussed. Patient verbalized understanding.

## 2021-03-02 NOTE — H&P (Signed)
History and Physical    PLEASE NOTE THAT DRAGON DICTATION SOFTWARE WAS USED IN THE CONSTRUCTION OF THIS NOTE.   Cory Hoover [id number] DOB: 1951-03-07 DOA: 03/02/2021  PCP: Dion Body, MD Patient coming from: home   I have personally briefly reviewed patient's old medical records in [place]  Chief Complaint: Falls  HPI: Cory Hoover is a 70 y.o. male with medical history significant for hyperlipidemia, hypertension, who is admitted to [facility] on 03/02/2021 with generalized weakness after presenting from home to [facility] ED complaining of falls.   The patient underwent Nissan fundoplication via Dr. Dahlia Byes at [facility] on 03/01/2021 before subsequently being discharged to home on the morning of 03/02/2021.  Postoperatively, the patient reports that he has been falling frequently as a consequence of generalized weakness that is new onset for him in the postoperative period.  Specifically, he reports symmetrical muscle weakness in the bilateral upper and lower extremities, which he states is new for him, with onset in the postoperative period.  As a result of this muscular weakness, the patient reports that he has experienced 3-4 ground-level mechanical falls over the course of the last 12 hours following discharge from the hospital.  Denies any associated loss of consciousness, and reports that he has not hit his head as a component to these falls.  Denies any preceding or associated chest pain, shortness of breath, palpitations, dizziness, presyncope, or syncope.  Reports mild generalized myalgias over that timeframe.  He conveys that this generalized weakness is a significant departure from his baseline functional status, in which he reports being quite active, playing tennis and racquetball 3-4 times per week.  He is concerned by this new onset generalized weakness as well as the above recent ground-level falls.   The patient reports that he has had diminished  grip strength in his right hand over the last several months, and its potential carpal tunnel involving the right hand in the setting of hours in front of the computer on a daily basis at home.  Within this framework, he denies any associated acute focal weakness, focal numbness, paresthesias, dysarthria, facial droop, acute change in vision, dysphagia, or vertigo.  Denies any headache, neck pain, neck stiffness.  Denies any associated subjective fever, chills, or rigors.  Denies any recent rhinitis, rhinorrhea, sore throat, shortness of breath, wheezing, cough, nausea, vomiting, abdominal pain, diarrhea, or rash.  No known recent COVID-19 exposures.  Denies any associated dysuria, gross hematuria, or change in urinary urgency/frequency.  Patient reports that he has been experiencing visual and auditory hallucinations over the last year at a frequency of approximately 1 episode per month.  He qualifies this by stating that he typically hears voices that sound like he is listening to radio at a frequency of 1 time per month.  He states that these voices do not speak to him, and he does not feel threatened by these experiences.  He also conveys that he has been experiencing visual hallucinations at a frequency of approximately 1 time per month over the course of the last year, noting that these visual loose Nations typically involve seeing an animal running in his house or sometimes an individual in his house that he notices symmetrically present there.  Denies any personal or family history of schizophrenia, and reports that he is not on any antipsychotic medications.  Denies any suicidal or homicidal ideations.  The patient overtly states that he is concerned that he may have underlying psychiatric illness contributing  to these visual and auditory hallucinations.  Of the last day, however, since being discharged from the hospital, he notes that the visual and auditory hallucinations have been happening at a  frequency of greater than once per hour and acknowledges that this is a significant increase in the frequency of these hallucinations relative to that which she has been experiencing very intermittently over the course of the preceding year plus.  He reports that he typically consumes 1 glass of wine per day, and denies any recent increase in alcohol consumption relative to this volume/frequency.  Denies any history of alcohol withdrawal symptoms.  Denies any use of recreational drugs.  In terms of recent changes to his outpatient medication regimen, the patient reports that he was discharged from the hospital earlier today on as needed oxycodone as well as Lyrica, both of which are presenting new medications for him.  He reports that he took 1 dose of both these medications earlier today.     ED Course:  Vital signs in the ED were notable for the following: Tetramex 98.2, heart rate 60-80 18, blood pressure 128/62 -136/73; respiratory rate 16-18, oxygen saturation 97 to 100% on room air.  Labs were notable for the following: Serum magnesium level 3.9.  CMP was notable for the following: Sodium 135, potassium 4.1, bicarbonate 24, BUN 20, creatinine 0.92 relative to 1.02 on the morning of 03/02/2021, glucose 122, calcium 8.9, albumin 4.3, alkaline phosphatase 73, AST 124 compared to most recent prior value of 26 in November 2021, ALT 136 compared to 30 in November 2021, and total bilirubin 1.1.  CBC notable for white blood cell count 11,600.  Screening nasopharyngeal COVID-19 PCR was performed in the ED today and found to be negative.  EKG shows sinus rhythm, heart rate 77, nonspecific T wave inversion in leads III and V3, which appears new relative to most recent prior EKG from November 2021, and no evidence of ST changes, including no evidence of ST elevation.  Chest x-ray performed on the morning of 03/02/2021 showed low lung volumes with probable subsegmental atelectasis in the left lower lobe, but  otherwise demonstrated no evidence of cardiopulmonary process.  CT head shows no evidence of acute intracranial process, including no evidence of intracranial hemorrhage or acute infarct.  MRI brain shows no evidence of acute process, including no evidence of acute infarct, mass, or midline shift.  While in the ED, the following were administered: Ativan 1 mg p.o. x1.     Review of Systems: As per HPI otherwise 10 point review of systems negative.   Past Medical History:  Diagnosis Date   Anxiety    Aortic atherosclerosis (HCC)    BPH (benign prostatic hyperplasia)    Coronary artery disease    Current use of long term anticoagulation    Clopidogrel   Diverticulosis    ED (erectile dysfunction)    GERD (gastroesophageal reflux disease)    History of hiatal hernia    History of impacted cerumen    HLD (hyperlipidemia)    HTN (hypertension)    PVD (peripheral vascular disease) ([place])     Past Surgical History:  Procedure Laterality Date   ESOPHAGEAL MANOMETRY N/A 12/27/2020   Procedure: ESOPHAGEAL MANOMETRY (EM);  Surgeon: Mauri Pole, MD;  Location: [facility];  Service: Endoscopy;  Laterality: N/A;   ESOPHAGOGASTRODUODENOSCOPY (EGD) WITH PROPOFOL N/A 01/18/2021   Procedure: ESOPHAGOGASTRODUODENOSCOPY (EGD) WITH PROPOFOL;  Surgeon: Lucilla Lame, MD;  Location: [facility] ENDOSCOPY;  Service: Endoscopy;  Laterality: N/A;  HEEL SPUR SURGERY Left    [place] IMPEDANCE STUDY N/A 12/27/2020   Procedure: [place] IMPEDANCE STUDY;  Surgeon: Mauri Pole, MD;  Location: [facility] ENDOSCOPY;  Service: Endoscopy;  Laterality: N/A;   VASECTOMY     XI ROBOTIC ASSISTED HIATAL HERNIA REPAIR N/A 03/01/2021   Procedure: XI ROBOTIC ASSISTED HIATAL HERNIA REPAIR WITH MESH RNFA to assist;  Surgeon: Jules Husbands, MD;  Location: [facility] ORS;  Service: General;  Laterality: N/A;    Social History:  reports that he has never smoked. He has never used smokeless tobacco. He reports current alcohol use of about 7.0  standard drinks of alcohol per week. He reports that he does not use drugs.   Allergies  Allergen Reactions   Citalopram Other (See Comments)    Reaction: Sexual side effects    Family History  Problem Relation Age of Onset   Heart failure Mother    Heart failure Father     Family history reviewed and not pertinent    Prior to Admission medications   Medication Sig Start Date End Date Taking? Authorizing Provider  acetaminophen (TYLENOL) 500 MG tablet Take 1,000 mg by mouth every 8 (eight) hours as needed (pain).    [provider]  amLODipine (NORVASC) 2.5 MG tablet Take 2.5 mg by mouth every evening. 07/07/20   [provider]  atorvastatin (LIPITOR) 40 MG tablet Take 60 mg by mouth every evening. 07/18/17   [provider]  clopidogrel (PLAVIX) 75 MG tablet Take 75 mg by mouth every evening. 07/07/20   [provider]  ibuprofen (ADVIL) 200 MG tablet Take 400 mg by mouth every 8 (eight) hours as needed.    [provider]  methocarbamol (ROBAXIN) 500 MG tablet Take 1 tablet (500 mg total) by mouth every 8 (eight) hours as needed for muscle spasms. 03/02/21   Tylene Fantasia, PA-C  Multiple Vitamin (MULTIVITAMIN WITH MINERALS) TABS tablet Take 1 tablet by mouth every other day. In the evening    [provider]  oxyCODONE (OXY IR/ROXICODONE) 5 MG immediate release tablet Take 1 tablet (5 mg total) by mouth every 4 (four) hours as needed for severe pain or breakthrough pain. 03/02/21   Tylene Fantasia, PA-C  pregabalin (LYRICA) 100 MG capsule Take 1 capsule (100 mg total) by mouth 3 (three) times daily for 14 days. 03/02/21 03/16/21  Tylene Fantasia, PA-C  Probiotic Product (PROBIOTIC PO) Take 1 capsule by mouth every evening.    [provider]  tamsulosin (FLOMAX) 0.4 MG CAPS capsule Take 2 capsules (0.8 mg total) by mouth daily. Patient taking differently: Take 0.8 mg by mouth every evening. 09/26/20   Billey Co, MD      Objective    Physical Exam: Vitals:   03/02/21 2029 03/02/21 2247  BP: 136/73 135/80  Pulse: 68 88  Resp: 18 17  Temp: 98.2 F (36.8 C)   TempSrc: Oral   SpO2: 95% 98%  Weight: 74.8 kg   Height: '5\' 6"'$  (1.676 m)     General: appears to be stated age; alert, oriented Skin: warm, dry, no rash Head:  AT/[place] Mouth:  Oral mucosa membranes appear dry, normal dentition Neck: supple; trachea midline Heart:  RRR; did not appreciate any M/R/G Lungs: CTAB, did not appreciate any wheezes, rales, or rhonchi Abdomen: + BS; soft, ND, NT Vascular: 2+ pedal pulses b/l; 2+ radial pulses b/l Extremities: no peripheral edema, no muscle wasting Neuro: strength and sensation intact in upper and  lower extremities b/l    Labs on Admission: I have personally reviewed following labs and imaging studies  CBC: Recent Labs  Lab 02/27/21 0834 03/01/21 1714 03/02/21 0523 03/02/21 2031  WBC 6.0 12.7* 14.8* 11.6*  NEUTROABS  --   --   --  9.7*  HGB 13.3 12.8* 12.0* 12.6*  HCT 37.3* 35.2* 33.1* 35.4*  MCV 88.0 87.8 87.1 88.1  PLT 230 201 198 [id number]   Basic Metabolic Panel: Recent Labs  Lab 02/27/21 0834 03/01/21 1714 03/02/21 0523 03/02/21 2031  NA 137  --  135 135  K 3.9  --  4.4 4.1  CL 106  --  105 105  CO2 28  --  22 24  GLUCOSE 99  --  132* 122*  BUN 13  --  17 20  CREATININE 0.98 1.04 1.02 0.92  CALCIUM 9.2  --  8.8* 8.9  MG  --   --  2.0  --    GFR: Estimated Creatinine Clearance: 67.4 mL/min (by C-G formula based on SCr of 0.92 mg/dL). Liver Function Tests: Recent Labs  Lab 03/02/21 2031  AST 124*  ALT 136*  ALKPHOS 73  BILITOT 1.1  PROT 7.3  ALBUMIN 4.3   No results for input(s): LIPASE, AMYLASE in the last 168 hours. No results for input(s): AMMONIA in the last 168 hours. Coagulation Profile: No results for input(s): INR, PROTIME in the last 168 hours. Cardiac Enzymes: No results for input(s): CKTOTAL, CKMB, CKMBINDEX, TROPONINI in the last 168 hours. BNP  (last 3 results) No results for input(s): PROBNP in the last 8760 hours. HbA1C: No results for input(s): HGBA1C in the last 72 hours. CBG: No results for input(s): GLUCAP in the last 168 hours. Lipid Profile: No results for input(s): CHOL, HDL, LDLCALC, TRIG, CHOLHDL, LDLDIRECT in the last 72 hours. Thyroid Function Tests: No results for input(s): TSH, T4TOTAL, FREET4, T3FREE, THYROIDAB in the last 72 hours. Anemia Panel: No results for input(s): VITAMINB12, FOLATE, FERRITIN, TIBC, IRON, RETICCTPCT in the last 72 hours. Urine analysis:    Component Value Date/Time   COLORURINE STRAW (A) 06/10/2020 0625   APPEARANCEUR CLEAR (A) 06/10/2020 0625   LABSPEC 1.006 06/10/2020 0625   PHURINE 5.0 06/10/2020 0625   GLUCOSEU NEGATIVE 06/10/2020 0625   HGBUR NEGATIVE 06/10/2020 0625   BILIRUBINUR NEGATIVE 06/10/2020 0625   KETONESUR NEGATIVE 06/10/2020 0625   PROTEINUR NEGATIVE 06/10/2020 0625   NITRITE NEGATIVE 06/10/2020 0625   LEUKOCYTESUR NEGATIVE 06/10/2020 0625    Radiological Exams on Admission: DG Chest 2 View  Result Date: 03/02/2021 CLINICAL DATA:  70 year old male status post repair of paraesophageal hernia. EXAM: CHEST - 2 VIEW COMPARISON:  Chest x-ray 03/01/2021. FINDINGS: Lung volumes are low. Opacity at the left base favored to reflect some resolving postoperative subsegmental atelectasis. No consolidative airspace disease. No pleural effusions. No definite pneumothorax no evidence of pulmonary edema. Heart size is normal. Upper mediastinal contours are within normal limits. Atherosclerotic calcifications in the thoracic aorta. Subcutaneous emphysema along the right lateral chest wall as well as the in the lower cervical regions and supraclavicular regions bilaterally. IMPRESSION: 1. Low lung volumes with probable subsegmental atelectasis in the left lower lobe. 2. Previously noted small left apical pneumothorax is no longer confidently identified. 3. Aortic atherosclerosis.  Electronically Signed   By: Vinnie Langton M.D.   On: 03/02/2021 07:31   CT HEAD WO CONTRAST (5MM)  Result Date: 03/02/2021 CLINICAL DATA:  Mental status changes and frequent falls. EXAM: CT HEAD WITHOUT CONTRAST  TECHNIQUE: Contiguous axial images were obtained from the base of the skull through the vertex without intravenous contrast. COMPARISON:  Head CT 06/10/2020 FINDINGS: Brain: No evidence of acute infarction, hemorrhage, hydrocephalus, extra-axial collection or mass lesion/mass effect. Vascular: Scattered vascular calcifications but no aneurysm hyperdense vessels. Skull: No skull fracture or bone lesions. Sinuses/Orbits: Scattered ethmoid sinus disease. The mastoid air cells and middle ear cavities are clear. Other: Scattered gas is noted in the soft tissues behind both maxillary sinuses also in the left upper neck area. Findings could be due to surgery that the patient had yesterday. IMPRESSION: 1. No acute intracranial findings or skull fracture. 2. Scattered gas in the soft tissues behind both maxillary sinuses and in the left upper neck area. Findings could be due to surgery that the patient had yesterday. Electronically Signed   By: Marijo Sanes M.D.   On: 03/02/2021 21:04   DG Chest Portable 1 View  Result Date: 03/01/2021 CLINICAL DATA:  Pain with inspiration, postop from laparoscopic Nissen fundoplication EXAM: PORTABLE CHEST 1 VIEW COMPARISON:  06/10/2020, 11/29/2020 FINDINGS: Single frontal view of the chest demonstrates an unremarkable cardiac silhouette. Subcutaneous gas is seen along the right lateral chest wall and in the bilateral supraclavicular regions. A small left apical pneumothorax is also identified, volume estimated less than 5%. No evidence of effusion. Patchy retrocardiac consolidation likely atelectasis. No acute bony abnormalities. IMPRESSION: 1. Small left apical pneumothorax, volume estimated less than 5%. 2. Subcutaneous emphysema in the bilateral supraclavicular regions  and along the right lateral chest wall. 3. Patchy retrocardiac left lower lobe consolidation. Favor atelectasis over airspace disease. Critical Value/emergent results were called by telephone at the time of interpretation on 03/01/2021 at 4:22 pm to provider Arita Miss , who verbally acknowledged these results. Electronically Signed   By: Randa Ngo M.D.   On: 03/01/2021 16:22     EKG: Independently reviewed, with result as described above.    Assessment/Plan   Cory Hoover is a 70 y.o. male with medical history significant for hyperlipidemia, hypertension, who is admitted to [facility] on 03/02/2021 with generalized weakness after presenting from home to [facility] ED complaining of falls.    Principal Problem:   Generalized weakness Active Problems:   Frequent falls   Hypermagnesemia   Hallucinations   Prerenal azotemia   Transaminitis   HLD (hyperlipidemia)   HTN (hypertension)      #) Generalized weakness: The patient reports new onset generalized weakness in the postoperative period following Niesen fundoplication on [id number], noting symmetrical, nonfocal weakness involving the bilateral upper and lower extremities, which appears to resulted in multiple ground-level mechanical falls over the last day without associated loss of consciousness.  Clinically, presentation appears less suggestive of acute CVA, and CT head shows no evidence of acute intracranial process, including no evidence of intracranial hemorrhage, while MRI brain shows no evidence of acute intracranial process, including no evidence of acute ischemic infarct, mass, or midline shift.  Suspect that patient's generalized weakness may be multifactorial in etiology, with suspected contribution from hypermagnesemia, as this electrolyte abnormalities frequently associated with generalized muscle weakness versus confusion from postanesthesia myalgias versus contribution from mild dehydration in the setting  of the appearance of dry oral mucous membranes as well as prerenal azotemia identified on presenting labs.  No evidence to suggest underlying infectious process at this time, but will further evaluate via urinalysis.  Chest x-ray performed on the morning of 03/02/2021 prior to discharge from the hospital was suggestive of  subsegmental atelectasis in the left lower lobe.  In the absence of interval development of acute respiratory symptoms, there does not appear to be an indication to repeat chest x-ray at this time.   Plan: Further evaluation and management of presenting hypermagnesemia, including initiation of calcium gluconate, as further detailed below.  Check CPK and MMA levels.  Check urinalysis.  Every 4 hour neurochecks.  Physical therapy and Occupational Therapy consults been placed for the morning.  Check CMP and CBC in the morning.  Fall precautions ordered.      #) Frequent falls: 3-4 episodes of ground-level mechanical falls occurring over the last 24 hours in the postoperative period, which appears to be as a consequence of generalized weakness, as outlined above and representing a significant depression from the patient's baseline ambulatory abilities, as further outlined above.  Not associate with any loss of consciousness, and CT head shows no evidence of acute intracranial process, including no evidence of intracranial hemorrhage.  Not associate any acute arthralgias, including no acute hip pain.  Plan: Further evaluation management of presenting generalized weakness, as further detailed above.  Fall precautions.  PT/OT consults have been ordered.  Check urinalysis.       #) Hypermagnesemia: Presenting serum magnesium level found to be elevated at 3.9 and felt to be potentially contributory towards patient's presenting generalized weakness, as above.  Will check CPK to evaluate contribution from generalized muscle breakdown.  No evidence of hemodynamic compromise nor any evidence of  bradycardia thus far.  No history of underlying thyroid pathology, but will check TSH to further evaluate.  Of note, presenting serum calcium level found to be within normal limits at 8.9, including when taking serum albumin level into account. Will hold anti-hypertensives for now given increased risk for hypotension in the setting of hypermagnesemia.  Plan: Calcium gluconate 1 g IV over 1 hour x 1 dose now.  Repeat serum magnesium level in the morning.  Repeat CMP in the morning.  Check CPK and TSH.  Hold home statin for now.  Further evaluation management of presenting generalized weakness specifically as above.  Monitor on telemetry.  Hold home Norvasc.       #) Dehydration: Clinical evidence to suggest dehydration the basis of physical exam findings, specifically the presence of dry oral mucous membrane in the context of recent n.p.o. status in preparation for recent fundoplication that was performed on 03/01/2021, in addition to presenting labs revealing prerenal azotemia.  No evidence of associated AKI nor any evidence of hypotension thus far.  Plan: Start continuous lactated Ringer's.  Monitor strict I's and O's and daily weights.  Repeat BMP in the morning.      #) Transaminitis: AST/ALT found to be mildly elevated relative to most recent prior transaminases from November 2021 in the absence of cholestatic pattern.  Denies any associated abdominal pain.  Source not entirely clear at this time.  Not septic appearing.  Plan: Hold home statin for now.  Check CPK and urinary drug screen.  Check INR to evaluate hepatic synthetic function.  Repeat CMP in the morning.       #) Visual/auditory hallucinations: Patient reports greater than 1 year intermittent visual/auditory hallucinations at a frequency of approximately 1 episode per month, in the absence of any known underlying psychiatric diagnosis.  Significant increase in frequency of these episodes over the course of the last day  following discharge from the hospital after [facility] fundoplication.  There does not appear to be an overt organic source of  patient's visual/auditory hallucinations over the course of the last year, rendering the possibility of underlying psychiatric illness as potential etiology, with exacerbation as a consequence of physiologic stressors over the last few days in the perioperative setting of his Niesen fundoplication, with the patient reporting that he is felt more anxious in anticipation of this procedure.  The patient appears to have good insight, and verbalizes his concern and recognition that he may have underlying psychiatric illness.  Denies any suicidal or homicidal ideations.  We will further evaluate for any underlying organic/medically reversible contributing factors although the patient may ultimately benefit from psychiatry consultation, to which he conveys that he is amenable.   Plan: Check urinalysis, urinary drug screen, TSH, VBG.  Repeat CMP and CBC in the morning.  Hold recently initiated Lyrica and as needed oxycodone.      #) Hyperlipidemia: On high intensity atorvastatin as an outpatient.  In the setting of acute transaminitis as well as acute hypomagnesemia, potentially in the basis of muscle breakdown, will hold home statin for now.   Plan: Hold home atorvastatin for now, as above.  Check CPK.  Repeat CMP in the morning.  Check INR and urinary drug screen.      #) Essential hypertension: On Norvasc as an outpatient.  In the setting of presenting hypermagnesemia with its associated risk for development of hypotension, will hold home hypertensive for now.   Plan: Hold home Norvasc for now, as above.  Close monitoring of ensuing blood pressure via routine vital signs.  Further evaluation management of presenting hypermagnesemia, as detailed above.      DVT prophylaxis: scd's  Code Status: Full code Family Communication: none Disposition Plan: Per Rounding Team Consults  called: none;  Admission status: observation;      Of note, this patient was added by me to the following Admit List/Treatment Team: [facility].      PLEASE NOTE THAT DRAGON DICTATION SOFTWARE WAS USED IN THE CONSTRUCTION OF THIS NOTE.   [place] [facility] Pager [phone] From [place] [facility]  Otherwise, please contact night-coverage  [email or web address] Password [facility]   03/02/2021, 10:57 PM

## 2021-03-02 NOTE — Discharge Summary (Signed)
[facility] SURGICAL DISCHARGE SUMMARY  Patient ID: Cory Hoover MRN: [id number] DOB/AGE: [birth year] 70 y.o.  Admit date: 03/01/2021 Discharge date: 03/02/2021  Discharge Diagnoses Patient Active Problem List   Diagnosis Date Noted   S/P repair of paraesophageal hernia 03/01/2021    Consultants None  Procedures 03/01/2021: Robotic assisted laparoscopic repair paraesophageal hernia with mesh ( Bio-A ) and Nissen fundoplication  HPI: Cory Hoover is a 70 y.o. male a history of recalcitrant reflux and chronic cough and hiatal hernia who presents to [facility] on 08/04 for scheduled procedure with Dr Dahlia Byes.   Hospital Course: Informed consent was obtained and documented, and patient underwent uneventful robotic assisted laparoscopic paraesophageal hernia repair with Nissen Fundoplication (Dr Dahlia Byes, [id number]).  Post-operatively, he did have a CXR secondary to subcutaneous emphysema which showed a possible <5% left apical PTX. Follow up CXR on POD1 showed resolution os this and he was overall doing well. Advancement of patient's diet and ambulation were well-tolerated. The remainder of patient's hospital course was essentially unremarkable, and discharge planning was initiated accordingly with patient safely able to be discharged home with appropriate discharge instructions, pain control, and outpatient follow-up after all of his questions were answered to his expressed satisfaction.   Discharge Condition: Good   Physical Examination:  Constitutional: Well appearing male, NAD Pulmonary: Normal effort, no respiratory distress Cardiac: HRRR Gastrointestinal: Soft, incisional soreness, non-distended, no rebound/guarding  Skin: Laparoscopic incisions are CDI with dermabond no erythema or drainage    Allergies as of 03/02/2021       Reactions   Citalopram Other (See Comments)   Reaction: Sexual side effects        Medication List     STOP taking these medications     pantoprazole 40 MG tablet Commonly known as: PROTONIX       TAKE these medications    acetaminophen 500 MG tablet Commonly known as: TYLENOL Take 1,000 mg by mouth every 8 (eight) hours as needed (pain).   amLODipine 2.5 MG tablet Commonly known as: NORVASC Take 2.5 mg by mouth every evening.   atorvastatin 40 MG tablet Commonly known as: LIPITOR Take 60 mg by mouth every evening.   clopidogrel 75 MG tablet Commonly known as: PLAVIX Take 75 mg by mouth every evening.   ibuprofen 200 MG tablet Commonly known as: ADVIL Take 400 mg by mouth every 8 (eight) hours as needed.   methocarbamol 500 MG tablet Commonly known as: ROBAXIN Take 1 tablet (500 mg total) by mouth every 8 (eight) hours as needed for muscle spasms.   multivitamin with minerals Tabs tablet Take 1 tablet by mouth every other day. In the evening   oxyCODONE 5 MG immediate release tablet Commonly known as: Oxy IR/ROXICODONE Take 1 tablet (5 mg total) by mouth every 4 (four) hours as needed for severe pain or breakthrough pain.   pregabalin 100 MG capsule Commonly known as: LYRICA Take 1 capsule (100 mg total) by mouth 3 (three) times daily for 14 days.   PROBIOTIC PO Take 1 capsule by mouth every evening.       ASK your doctor about these medications    tamsulosin 0.4 MG Caps capsule Commonly known as: FLOMAX Take 2 capsules (0.8 mg total) by mouth daily.          Follow-up Information     Pabon, [place] F, MD. Schedule an appointment as soon as possible for a visit in 2 week(s).   Specialty: General Surgery Why: s/p hiaal hernia  repair Contact information: [address] [place] [address] [phone]                  Time spent on discharge management including discussion of hospital course, clinical condition, outpatient instructions, prescriptions, and follow up with the patient and members of the medical team: >30 minutes  -- Edison Simon ,  PA-C [facility]  03/02/2021, 8:39 AM [phone] M-F: 7am - 4pm

## 2021-03-02 NOTE — ED Notes (Signed)
Patient transported to MRI 

## 2021-03-02 NOTE — Plan of Care (Signed)
  Problem: Education: Goal: Required Educational Video(s) Outcome: Adequate for Discharge   Problem: Clinical Measurements: Goal: Ability to maintain clinical measurements within normal limits will improve Outcome: Adequate for Discharge Goal: Postoperative complications will be avoided or minimized Outcome: Adequate for Discharge   Problem: Skin Integrity: Goal: Demonstration of wound healing without infection will improve Outcome: Adequate for Discharge   

## 2021-03-02 NOTE — ED Provider Notes (Signed)
[facility] Emergency Department Provider Note ____________________________________________   Event Date/Time   First MD Initiated Contact with Patient 03/02/21 2139     (approximate)  I have reviewed the triage vital signs and the nursing notes.  HISTORY  Chief Complaint Fall   HPI Cory Hoover is a 70 y.o. Audrie Gallus presents to the ED for evaluation of recurrent falls.  Chart review indicates history of GERD and hiatal hernia, just had a robotic-assisted Nissen fundoplication performed yesterday. Otherwise history of CAD, HTN, HLD and BPH.  Patient presents to the ED for evaluation of an interesting constellation of symptoms.  He reports over the past 24 hours, since getting home from his fundoplication, he has had various "aggressive" falls without syncope.  He reports each fall with him falling backwards "aggressively" without syncope.  He reports 3 falls in the span of 20 minutes this evening just prior to arrival.  Further reports dropping things out of his right hand unexpectedly.  Denies pain to his arm or hand.  Further reports audiovisual hallucinations.  Provides examples of hearing voices in the other room, and denies command auditory hallucinations.  Further reports seeing things other people do not, such as the floor turning into a desk, and then melting back into the floor.  Or a toilet that just looks like a stop sign.   Patient reports fear of going home and is requesting observation admission.  His wife is at the bedside and agrees that she does not feel comfortable taking him home and this is quite unusual.  Patient reports a familial history of some mental health disorders.  He reports his father was always "very paranoid," and that they do not have a relationship with patient's father, and they deny any known diagnoses of schizophrenia.  Patient reports that he does not consider himself paranoid, but is anxious.  Denies SI/HI.  Past  Medical History:  Diagnosis Date   Anxiety    Aortic atherosclerosis (HCC)    BPH (benign prostatic hyperplasia)    Coronary artery disease    Current use of long term anticoagulation    Clopidogrel   Diverticulosis    ED (erectile dysfunction)    GERD (gastroesophageal reflux disease)    History of hiatal hernia    History of impacted cerumen    HLD (hyperlipidemia)    HTN (hypertension)    PVD (peripheral vascular disease) ([place])     Patient Active Problem List   Diagnosis Date Noted   S/P repair of paraesophageal hernia 03/01/2021   Cough    Heartburn    Gastritis without bleeding    Gastroesophageal reflux disease 12/06/2020   History of TIA (transient ischemic attack) 06/19/2020   Acute focal neurological deficit 06/10/2020   BPH (benign prostatic hyperplasia) 06/10/2020   Abnormal ECG 12/08/2019   Benign prostatic hyperplasia with nocturia 10/19/2019   Coronary artery disease 10/19/2019   Gastroesophageal reflux disease with esophagitis 10/19/2019   Encounter for general adult medical examination without abnormal findings 04/10/2017   History of normocytic normochromic anemia 04/10/2017   Vaccine counseling 03/07/2016   Pure hypercholesterolemia 05/22/2015   Bradycardia 09/22/2014    Past Surgical History:  Procedure Laterality Date   ESOPHAGEAL MANOMETRY N/A 12/27/2020   Procedure: ESOPHAGEAL MANOMETRY (EM);  Surgeon: Mauri Pole, MD;  Location: [facility] ENDOSCOPY;  Service: Endoscopy;  Laterality: N/A;   ESOPHAGOGASTRODUODENOSCOPY (EGD) WITH PROPOFOL N/A 01/18/2021   Procedure: ESOPHAGOGASTRODUODENOSCOPY (EGD) WITH PROPOFOL;  Surgeon: Lucilla Lame, MD;  Location: [facility]  ENDOSCOPY;  Service: Endoscopy;  Laterality: N/A;   HEEL SPUR SURGERY Left    [place] IMPEDANCE STUDY N/A 12/27/2020   Procedure: [place] IMPEDANCE STUDY;  Surgeon: Mauri Pole, MD;  Location: [facility] ENDOSCOPY;  Service: Endoscopy;  Laterality: N/A;   VASECTOMY     XI ROBOTIC ASSISTED HIATAL HERNIA REPAIR  N/A 03/01/2021   Procedure: XI ROBOTIC ASSISTED HIATAL HERNIA REPAIR WITH MESH RNFA to assist;  Surgeon: Jules Husbands, MD;  Location: [facility] ORS;  Service: General;  Laterality: N/A;    Prior to Admission medications   Medication Sig Start Date End Date Taking? Authorizing Provider  acetaminophen (TYLENOL) 500 MG tablet Take 1,000 mg by mouth every 8 (eight) hours as needed (pain).    [provider]  amLODipine (NORVASC) 2.5 MG tablet Take 2.5 mg by mouth every evening. 07/07/20   [provider]  atorvastatin (LIPITOR) 40 MG tablet Take 60 mg by mouth every evening. 07/18/17   [provider]  clopidogrel (PLAVIX) 75 MG tablet Take 75 mg by mouth every evening. 07/07/20   [provider]  ibuprofen (ADVIL) 200 MG tablet Take 400 mg by mouth every 8 (eight) hours as needed.    [provider]  methocarbamol (ROBAXIN) 500 MG tablet Take 1 tablet (500 mg total) by mouth every 8 (eight) hours as needed for muscle spasms. 03/02/21   Tylene Fantasia, PA-C  Multiple Vitamin (MULTIVITAMIN WITH MINERALS) TABS tablet Take 1 tablet by mouth every other day. In the evening    [provider]  oxyCODONE (OXY IR/ROXICODONE) 5 MG immediate release tablet Take 1 tablet (5 mg total) by mouth every 4 (four) hours as needed for severe pain or breakthrough pain. 03/02/21   Tylene Fantasia, PA-C  pregabalin (LYRICA) 100 MG capsule Take 1 capsule (100 mg total) by mouth 3 (three) times daily for 14 days. 03/02/21 03/16/21  Tylene Fantasia, PA-C  Probiotic Product (PROBIOTIC PO) Take 1 capsule by mouth every evening.    [provider]  tamsulosin (FLOMAX) 0.4 MG CAPS capsule Take 2 capsules (0.8 mg total) by mouth daily. Patient taking differently: Take 0.8 mg by mouth every evening. 09/26/20   Billey Co, MD    Allergies Citalopram  Family History  Problem Relation Age of Onset   Heart failure Mother    Heart failure Father     Social  History Social History   Tobacco Use   Smoking status: Never   Smokeless tobacco: Never  Vaping Use   Vaping Use: Never used  Substance Use Topics   Alcohol use: Yes    Alcohol/week: 7.0 standard drinks    Types: 7 Glasses of wine per week   Drug use: No    Review of Systems  Constitutional: No fever/chills Eyes: No visual changes. ENT: No sore throat. Cardiovascular: Denies chest pain. Respiratory: Denies shortness of breath. Gastrointestinal: No abdominal pain.  No nausea, no vomiting.  No diarrhea.  No constipation. Genitourinary: Negative for dysuria. Musculoskeletal: Negative for back pain. Skin: Negative for rash. Neurological: Negative for headaches, numbness. Positive for sudden falls and dropping things from his right hand ____________________________________________   PHYSICAL EXAM:  VITAL SIGNS: Vitals:   03/02/21 2029 03/02/21 2247  BP: 136/73 135/80  Pulse: 68 88  Resp: 18 17  Temp: 98.2 F (36.8 C)   SpO2: 95% 98%    Constitutional: Alert and oriented. Well appearing and in no acute distress. Eyes: Conjunctivae are normal. PERRL. EOMI. Head: Atraumatic.  Nose: No congestion/rhinnorhea. Mouth/Throat: Mucous membranes are moist.  Oropharynx non-erythematous. Neck: No stridor. No cervical spine tenderness to palpation. Cardiovascular: Normal rate, regular rhythm. Grossly normal heart sounds.  Good peripheral circulation. Respiratory: Normal respiratory effort.  No retractions. Lungs CTAB. Gastrointestinal: Soft , nondistended, nontender to palpation. No CVA tenderness. Musculoskeletal: No lower extremity tenderness nor edema.  No joint effusions. No signs of acute trauma. Neurologic:  Normal speech and language. No gross focal neurologic deficits are appreciated. No gait instability noted. Skin:  Skin is warm, dry and intact. No rash noted. Psychiatric: Mood and affect are somewhat anxious, but with linear thought processes. Speech and behavior are  normal. ____________________________________________   LABS (all labs ordered are listed, but only abnormal results are displayed)  Labs Reviewed  CBC WITH DIFFERENTIAL/PLATELET - Abnormal; Notable for the following components:      Result Value   WBC 11.6 (*)    RBC 4.02 (*)    Hemoglobin 12.6 (*)    HCT 35.4 (*)    Neutro Abs 9.7 (*)    All other components within normal limits  COMPREHENSIVE METABOLIC PANEL - Abnormal; Notable for the following components:   Glucose, Bld 122 (*)    AST 124 (*)    ALT 136 (*)    All other components within normal limits   ____________________________________________  12 Lead EKG  Sinus rhythm with a rate of 77 bpm.  Normal axis and intervals.  Nonspecific ST changes inferiorly and laterally without STEMI criteria. ____________________________________________  RADIOLOGY  ED MD interpretation: 2 view CXR reviewed by me without evidence of acute cardiopulmonary pathology.   CT head reviewed by me without evidence of acute intracranial pathology.  Official radiology report(s): DG Chest 2 View  Result Date: 03/02/2021 CLINICAL DATA:  70 year old male status post repair of paraesophageal hernia. EXAM: CHEST - 2 VIEW COMPARISON:  Chest x-ray 03/01/2021. FINDINGS: Lung volumes are low. Opacity at the left base favored to reflect some resolving postoperative subsegmental atelectasis. No consolidative airspace disease. No pleural effusions. No definite pneumothorax no evidence of pulmonary edema. Heart size is normal. Upper mediastinal contours are within normal limits. Atherosclerotic calcifications in the thoracic aorta. Subcutaneous emphysema along the right lateral chest wall as well as the in the lower cervical regions and supraclavicular regions bilaterally. IMPRESSION: 1. Low lung volumes with probable subsegmental atelectasis in the left lower lobe. 2. Previously noted small left apical pneumothorax is no longer confidently identified. 3. Aortic  atherosclerosis. Electronically Signed   By: Vinnie Langton M.D.   On: 03/02/2021 07:31   CT HEAD WO CONTRAST (5MM)  Result Date: 03/02/2021 CLINICAL DATA:  Mental status changes and frequent falls. EXAM: CT HEAD WITHOUT CONTRAST TECHNIQUE: Contiguous axial images were obtained from the base of the skull through the vertex without intravenous contrast. COMPARISON:  Head CT 06/10/2020 FINDINGS: Brain: No evidence of acute infarction, hemorrhage, hydrocephalus, extra-axial collection or mass lesion/mass effect. Vascular: Scattered vascular calcifications but no aneurysm hyperdense vessels. Skull: No skull fracture or bone lesions. Sinuses/Orbits: Scattered ethmoid sinus disease. The mastoid air cells and middle ear cavities are clear. Other: Scattered gas is noted in the soft tissues behind both maxillary sinuses also in the left upper neck area. Findings could be due to surgery that the patient had yesterday. IMPRESSION: 1. No acute intracranial findings or skull fracture. 2. Scattered gas in the soft tissues behind both maxillary sinuses and in the left upper neck area. Findings could be due to surgery that the patient had  yesterday. Electronically Signed   By: Marijo Sanes M.D.   On: 03/02/2021 21:04    ____________________________________________   PROCEDURES and INTERVENTIONS  Procedure(s) performed (including Critical Care):  .1-3 Lead EKG Interpretation  Date/Time: 03/02/2021 10:50 PM Performed by: Vladimir Crofts, MD Authorized by: Vladimir Crofts, MD     Interpretation: normal     ECG rate:  70   ECG rate assessment: normal     Rhythm: sinus rhythm     Ectopy: none     Conduction: normal    Medications  LORazepam (ATIVAN) tablet 1 mg (1 mg Oral Given 03/02/21 2240)    ____________________________________________   MDM / ED COURSE   70 year old male presents to the ED with an interesting constellation of symptoms concerning for psychiatric versus neurologic pathology requiring  medical observation admission.  He is hemodynamically stable and neurologically intact with linear thought processes and no evidence of psychiatric emergency.  Some of what he describes is concerning for neurologic pathology with his motor dysfunction with falls and difficulty holding things in his right hand.  Some of what is described is concerning for psychiatric pathology with AV hallucinations and family history of possible schizophrenia with his paranoid father.  CT of his head without evidence of acute ICH or CVA.  MRI pending at the time of admission to assess for ischemic features.  He may benefit from a neurologic evaluation in the morning, and if this is unremarkable then possibly a psychiatric evaluation as well.     ____________________________________________   FINAL CLINICAL IMPRESSION(S) / ED DIAGNOSES  Final diagnoses:  Recurrent falls  Hallucinations     ED Discharge Orders     None        Kiyara Bouffard Tamala Julian   Note:  This document was prepared using Dragon voice recognition software and may include unintentional dictation errors.    Vladimir Crofts, MD 03/02/21 [phone]

## 2021-03-02 NOTE — ED Notes (Signed)
Spoke with dr. Tamala Julian regarding pt's symptoms, orders received.

## 2021-03-02 NOTE — ED Triage Notes (Addendum)
Pt states for several months has felt less coherent, has been having frequent falls and is dropping things. Pt states has been having involuntary movments. Pt is alert and oriented, but spouse states pt has been misidentifying objects, like he is looking at a toilet and sees a stop sign. Pt states has been getting progressively getting worse and today is "unliveable". Pt states had hiatal hernia surgery yesterday and has noticied since surgery yesterday symptoms have become significantly worse.

## 2021-03-03 ENCOUNTER — Encounter: Payer: Self-pay | Admitting: Internal Medicine

## 2021-03-03 DIAGNOSIS — R443 Hallucinations, unspecified: Secondary | ICD-10-CM | POA: Insufficient documentation

## 2021-03-03 DIAGNOSIS — Z20822 Contact with and (suspected) exposure to covid-19: Secondary | ICD-10-CM | POA: Insufficient documentation

## 2021-03-03 DIAGNOSIS — E785 Hyperlipidemia, unspecified: Secondary | ICD-10-CM | POA: Diagnosis present

## 2021-03-03 DIAGNOSIS — I2510 Atherosclerotic heart disease of native coronary artery without angina pectoris: Secondary | ICD-10-CM | POA: Insufficient documentation

## 2021-03-03 DIAGNOSIS — R531 Weakness: Principal | ICD-10-CM

## 2021-03-03 DIAGNOSIS — E860 Dehydration: Secondary | ICD-10-CM | POA: Insufficient documentation

## 2021-03-03 DIAGNOSIS — Z79899 Other long term (current) drug therapy: Secondary | ICD-10-CM | POA: Insufficient documentation

## 2021-03-03 DIAGNOSIS — Z8673 Personal history of transient ischemic attack (TIA), and cerebral infarction without residual deficits: Secondary | ICD-10-CM | POA: Insufficient documentation

## 2021-03-03 DIAGNOSIS — R7401 Elevation of levels of liver transaminase levels: Secondary | ICD-10-CM | POA: Insufficient documentation

## 2021-03-03 DIAGNOSIS — R7989 Other specified abnormal findings of blood chemistry: Secondary | ICD-10-CM | POA: Diagnosis present

## 2021-03-03 DIAGNOSIS — I10 Essential (primary) hypertension: Secondary | ICD-10-CM | POA: Insufficient documentation

## 2021-03-03 DIAGNOSIS — Z7901 Long term (current) use of anticoagulants: Secondary | ICD-10-CM | POA: Insufficient documentation

## 2021-03-03 DIAGNOSIS — Z7902 Long term (current) use of antithrombotics/antiplatelets: Secondary | ICD-10-CM | POA: Insufficient documentation

## 2021-03-03 LAB — COMPREHENSIVE METABOLIC PANEL
ALT: 157 U/L — ABNORMAL HIGH (ref 0–44)
AST: 137 U/L — ABNORMAL HIGH (ref 15–41)
Albumin: 3.6 g/dL (ref 3.5–5.0)
Alkaline Phosphatase: 69 U/L (ref 38–126)
Anion gap: 8 (ref 5–15)
BUN: 16 mg/dL (ref 8–23)
CO2: 23 mmol/L (ref 22–32)
Calcium: 9.1 mg/dL (ref 8.9–10.3)
Chloride: 106 mmol/L (ref 98–111)
Creatinine, Ser: 0.89 mg/dL (ref 0.61–1.24)
GFR, Estimated: >60 mL/min (ref >60)
Glucose, Bld: 102 mg/dL — ABNORMAL HIGH (ref 70–99)
Potassium: 3.8 mmol/L (ref 3.5–5.1)
Sodium: 137 mmol/L (ref 135–145)
Total Bilirubin: 1.3 mg/dL — ABNORMAL HIGH (ref 0.3–1.2)
Total Protein: 6.8 g/dL (ref 6.5–8.1)

## 2021-03-03 LAB — CBC
HCT: 34.3 % — ABNORMAL LOW (ref 39.0–52.0)
Hemoglobin: 12.2 g/dL — ABNORMAL LOW (ref 13.0–17.0)
MCH: 31 pg (ref 26.0–34.0)
MCHC: 35.6 g/dL (ref 30.0–36.0)
MCV: 87.3 fL (ref 80.0–100.0)
Platelets: 191 10*3/uL (ref 150–400)
RBC: 3.93 MIL/uL — ABNORMAL LOW (ref 4.22–5.81)
RDW: 12.5 % (ref 11.5–15.5)
WBC: 9.1 10*3/uL (ref 4.0–10.5)
nRBC: 0 % (ref 0.0–0.2)

## 2021-03-03 LAB — URINE DRUG SCREEN, QUALITATIVE (ARMC ONLY)
Amphetamines, Ur Screen: NOT DETECTED
Barbiturates, Ur Screen: NOT DETECTED
Benzodiazepine, Ur Scrn: POSITIVE — AB
Cannabinoid 50 Ng, Ur ~~LOC~~: NOT DETECTED
Cocaine Metabolite,Ur ~~LOC~~: NOT DETECTED
MDMA (Ecstasy)Ur Screen: NOT DETECTED
Methadone Scn, Ur: NOT DETECTED
Opiate, Ur Screen: NOT DETECTED
Phencyclidine (PCP) Ur S: NOT DETECTED
Tricyclic, Ur Screen: NOT DETECTED

## 2021-03-03 LAB — CK: Total CK: 132 U/L (ref 49–397)

## 2021-03-03 LAB — TSH: TSH: 2.743 u[IU]/mL (ref 0.350–4.500)

## 2021-03-03 LAB — RESP PANEL BY RT-PCR (FLU A&B, COVID) ARPGX2
Influenza A by PCR: NEGATIVE
Influenza B by PCR: NEGATIVE
SARS Coronavirus 2 by RT PCR: NEGATIVE

## 2021-03-03 LAB — URINALYSIS, ROUTINE W REFLEX MICROSCOPIC
Bilirubin Urine: NEGATIVE
Glucose, UA: NEGATIVE mg/dL
Hgb urine dipstick: NEGATIVE
Ketones, ur: NEGATIVE mg/dL
Leukocytes,Ua: NEGATIVE
Nitrite: NEGATIVE
Protein, ur: NEGATIVE mg/dL
Specific Gravity, Urine: 1.01 (ref 1.005–1.030)
pH: 7 (ref 5.0–8.0)

## 2021-03-03 LAB — PROTIME-INR
INR: 1.2 (ref 0.8–1.2)
Prothrombin Time: 14.8 seconds (ref 11.4–15.2)

## 2021-03-03 LAB — BLOOD GAS, VENOUS
Acid-Base Excess: 0.9 mmol/L (ref 0.0–2.0)
Bicarbonate: 23.1 mmol/L (ref 20.0–28.0)
O2 Saturation: 95.8 %
Patient temperature: 37
pCO2, Ven: 29 mmHg — ABNORMAL LOW (ref 44.0–60.0)
pH, Ven: 7.51 — ABNORMAL HIGH (ref 7.250–7.430)
pO2, Ven: 72 mmHg — ABNORMAL HIGH (ref 32.0–45.0)

## 2021-03-03 LAB — MAGNESIUM: Magnesium: 2 mg/dL (ref 1.7–2.4)

## 2021-03-03 MED ORDER — ATORVASTATIN CALCIUM 40 MG PO TABS: ORAL_TABLET | ORAL | 1 refills | Status: AC

## 2021-03-03 MED ORDER — CALCIUM GLUCONATE-NACL 1-0.675 GM/50ML-% IV SOLN
1.0000 g | Freq: Once | INTRAVENOUS | Status: AC
  Administered 2021-03-03 (×2): 1000 mg via INTRAVENOUS
  Filled 2021-03-03: qty 50

## 2021-03-03 MED ORDER — TAMSULOSIN HCL 0.4 MG PO CAPS: 0.8000 mg | ORAL_CAPSULE | Freq: Every evening | ORAL | Status: AC

## 2021-03-03 MED ORDER — LACTATED RINGERS IV SOLN: INTRAVENOUS | Status: DC

## 2021-03-03 NOTE — ED Notes (Signed)
Patient transported to inpatient unit via transport.

## 2021-03-03 NOTE — Evaluation (Signed)
Physical Therapy Evaluation Patient Details Name: Cory Hoover MRN: [id number] DOB: 1951/04/26 Today's Date: 03/03/2021   History of Present Illness  70 y.o. male admitted with generalized weakness and multiple falls with medical history significant for hyperlipidemia and hypertension. Pt underwent Nissen fundoplication on [id number]. Within 12 hour span of being home, pt endured 4 falls, 3 within 30 minutes.  Clinical Impression  Pt received supine in bed, agreeable to therapy. Bed mobility and STS transfers performed independently and without difficulty. 2 trials of ambulation were performed. W/o AD, pt demo decreased stability during ambulation with rightward drifting and difficulty to correct. 2nd trial including RW, pt demo no LOB or drifting. Pt stated he felt more stable and safer using RW. He also stated he did not want to use one at home so PT explained that RW would be temporary; pt only agreeable to [facility] until wife entered room with OT who states pt is now agreeable to RW. PT spent additional time listening to pt concerns and related these concerns back to the falls - PT  relayed this information to the MD for a possible referral. Overall, pt demo impaired stability during ambulation and lack of safety awareness. Pt safe to DC home using RW; he could benefit from OP PT for skilled balance training. Would benefit from skilled PT to address above deficits and promote optimal return to PLOF.     Follow Up Recommendations Outpatient PT    Equipment Recommendations  Rolling walker with 5" wheels    Recommendations for Other Services       Precautions / Restrictions Precautions Precautions: Fall Restrictions Weight Bearing Restrictions: No      Mobility  Bed Mobility Overal bed mobility: Independent             General bed mobility comments: Performed with no bed rails, no increased time    Transfers Overall transfer level: Independent               General transfer  comment: STS  Ambulation/Gait Ambulation/Gait assistance: Min guard Gait Distance (Feet): 250 Feet Assistive device: None Gait Pattern/deviations: Step-through pattern;Drifts right/left Gait velocity: mildly decreased   General Gait Details: 2 trials of ambulation: 1) w/o AD pt required CGA to steady on multiple occasions due to rightward drift and unsteadiness. 2) w/ RW pt required SUP with no drift, no LOB  Stairs            Wheelchair Mobility    Modified Rankin (Stroke Patients Only)       Balance Overall balance assessment: Needs assistance Sitting-balance support: No upper extremity supported;Feet supported Sitting balance-Leahy Scale: Normal     Standing balance support: No upper extremity supported;During functional activity Standing balance-Leahy Scale: Poor Standing balance comment: Decreased stability during ambulation. Fair balance in static standing                             Pertinent Vitals/Pain Pain Assessment: 0-10 Pain Score: 3  Pain Location: "tenderness" at surgical site Pain Descriptors / Indicators: Tender Pain Intervention(s): Limited activity within patient's tolerance;Monitored during session    Home Living Family/patient expects to be discharged to:: Private residence Living Arrangements: Spouse/significant other Available Help at Discharge: Family Type of Home: House Home Access: Stairs to enter Entrance Stairs-Rails: Right Entrance Stairs-Number of Steps: 4 steps at garage entrance [place]: Two level;Able to live on main level with bedroom/bathroom Home Equipment: Shower seat - built in;Cane - single  point;Grab bars - tub/shower      Prior Function Level of Independence: Independent         Comments: Very active at baseline, plays tennis, does not use AD     Hand Dominance   Dominant Hand: Right    Extremity/Trunk Assessment   Upper Extremity Assessment Upper Extremity Assessment: Overall WFL for tasks  assessed    Lower Extremity Assessment Lower Extremity Assessment: Overall WFL for tasks assessed    Cervical / Trunk Assessment Cervical / Trunk Assessment: Normal  Communication   Communication: No difficulties  Cognition Arousal/Alertness: Awake/alert Behavior During Therapy: WFL for tasks assessed/performed Overall Cognitive Status: Within Functional Limits for tasks assessed                                 General Comments: A&Ox4      General Comments      Exercises Other Exercises Other Exercises: Pt educated on PT role, POC, safety with mobility, recommendation for RW, falls prevention, safety upon discharge, activities to avoid/modify until strength increases and falls decrease. Concern regarding falls - PT spoke to MD about possible causes other than weakness. 2 trials of ambulation - w/ and w/o AD to compare for safer mobility. Extensive time spent listening to pt concerns and probing additional questions.   Assessment/Plan    PT Assessment Patient needs continued PT services  PT Problem List Decreased safety awareness;Decreased balance;Decreased mobility;Impaired sensation       PT Treatment Interventions DME instruction;Balance training;Modalities;Gait training;Neuromuscular re-education;Stair training;Functional mobility training;Patient/family education;Therapeutic activities;Therapeutic exercise    PT Goals (Current goals can be found in the Care Plan section)  Acute Rehab PT Goals Patient Stated Goal: to go home PT Goal Formulation: With patient Time For Goal Achievement: 03/17/21 Potential to Achieve Goals: Good    Frequency Min 2X/week   Barriers to discharge        Co-evaluation               AM-PAC PT "6 Clicks" Mobility  Outcome Measure Help needed turning from your back to your side while in a flat bed without using bedrails?: None Help needed moving from lying on your back to sitting on the side of a flat bed without  using bedrails?: None Help needed moving to and from a bed to a chair (including a wheelchair)?: None Help needed standing up from a chair using your arms (e.g., wheelchair or bedside chair)?: None Help needed to walk in hospital room?: A Little Help needed climbing 3-5 steps with a railing? : A Little 6 Click Score: 22    End of Session Equipment Utilized During Treatment: Gait belt Activity Tolerance: Patient tolerated treatment well Patient left: in chair;with nursing/sitter in room Nurse Communication: Mobility status PT Visit Diagnosis: Unsteadiness on feet (R26.81);Other abnormalities of gait and mobility (R26.89);Repeated falls (R29.6);History of falling (Z91.81);Difficulty in walking, not elsewhere classified (R26.2);Other symptoms and signs involving the nervous system (R29.898)    Time: [id number] PT Time Calculation (min) (ACUTE ONLY): 31 min   Charges:   PT Evaluation $PT Eval Low Complexity: 1 Low PT Treatments $Gait Training: 8-22 mins $Therapeutic Activity: 8-22 mins        Patrina Levering PT, DPT 03/03/21 1:26 PM [id number]   Ramonita Lab 03/03/2021, 1:18 PM

## 2021-03-03 NOTE — ED Notes (Signed)
Patient updated on inpatient bed assignment and pending transport to inpatient unit.

## 2021-03-03 NOTE — Progress Notes (Signed)
CSW consulted for DME needs rolling walker at discharge.   Jasmine with [employer] to deliver rolling walker within the next hour to patient room.  [place], [place]

## 2021-03-03 NOTE — ED Notes (Signed)
Patient is resting comfortably. 

## 2021-03-03 NOTE — Discharge Summary (Signed)
Physician Discharge Summary   Cory Hoover  male DOB: 12-26-1950  IU:[id number]  PCP: Dion Body, MD  Admit date: 03/02/2021 Discharge date: 03/03/2021  Admitted From: home Disposition:  home Wife updated prior to discharge.  CODE STATUS: Full code  Discharge Instructions     Discharge instructions   Complete by: As directed    Brain MRI showed no stroke.  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  You will need to see a neurologist for further workup, and you have elected for your PCP to refer you to a neurologist.  In the mean time, please use a walker to avoid further falls.    Avoid medications that dull your senses even more.   Dr. Enzo Bi - -   No wound care   Complete by: As directed         Hospital Course:  For full details, please see H&P, progress notes, consult notes and ancillary notes.  Briefly,  Cory Hoover is a 70 y.o. male with medical history significant for hypertension, who was admitted to [facility] on 03/02/2021 with generalized weakness after presenting from home to [facility] ED complaining of falls.    The patient underwent Nissan fundoplication with Dr. Dahlia Byes at [facility] on 03/01/2021 before subsequently being discharged to home on the morning of 03/02/2021.  Postoperatively, the patient reports that he has been falling frequently as a consequence of generalized weakness that is new onset for him in the postoperative period.    Falls and weakness Visual/auditory hallucinations:  --MRI brain neg for stroke.  Upon further questioning, pt has been having progressive weakness and falls in the past year.  Pt also reported at times having wrong positional information of his limbs, for example, feeling that his legs were crossed but upon looking to find them not crossed.  While PT worked  with pt, pt was found to drift to 1 side consistently, but with walker, pt was able to ambulate normally.  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  Pt was initially resistant to using a walker, but was strongly encouraged to do so to avoid further falls and injuries.  Patient also reported greater than 1 year intermittent visual/auditory hallucinations at a frequency of approximately 1 episode per month, in the absence of any known underlying psychiatric diagnosis.  Significant increase in frequency of these episodes over the course of the last day following discharge from the hospital after [facility] fundoplication.  Together with above mentioned weakness and proprioception defect, concern is for neuro-degenerative disease.  Outpatient neurology followup advised.  Pt wanted his PCP to make the neurology referral.   Hypermagnesemia:  magnesium level found to be elevated at 3.9, received Ca gluconate and Mag was wnl the next day.  Doubt that this is the reason for pt's falls.  Dehydration:  No evidence of associated AKI nor any evidence of hypotension thus far.  Ordered IVF on admission.    Transaminitis:  AST/ALT found to be mildly elevated relative to most recent prior transaminases from November 2021 in the absence of cholestatic pattern.  Denies any associated abdominal pain.  Source not entirely clear at this time.  Not septic appearing. --home statin held pending PCP followup.   Hyperlipidemia:  --home statin held due to mild Transaminitis, pending PCP  followup.   Essential hypertension:  --resume home amlodipine      Discharge Diagnoses:  Principal Problem:   Generalized weakness Active Problems:   Frequent falls   Hypermagnesemia   Hallucinations   Prerenal azotemia   Transaminitis   HLD (hyperlipidemia)   HTN (hypertension)     Discharge Instructions:  Allergies as of 03/03/2021        Reactions   Citalopram Other (See Comments)   Reaction: Sexual side effects        Medication List     STOP taking these medications    methocarbamol 500 MG tablet Commonly known as: ROBAXIN   oxyCODONE 5 MG immediate release tablet Commonly known as: Oxy IR/ROXICODONE   pregabalin 100 MG capsule Commonly known as: LYRICA       TAKE these medications    acetaminophen 500 MG tablet Commonly known as: TYLENOL Take 1,000 mg by mouth every 8 (eight) hours as needed (pain).   amLODipine 2.5 MG tablet Commonly known as: NORVASC Take 2.5 mg by mouth every evening.   atorvastatin 40 MG tablet Commonly known as: LIPITOR Hold until followup with your PCP, due to your elevated liver enzymes and worsening weakness. What changed:  how much to take how to take this when to take this additional instructions   clopidogrel 75 MG tablet Commonly known as: PLAVIX Take 75 mg by mouth every evening.   ibuprofen 200 MG tablet Commonly known as: ADVIL Take 400 mg by mouth every 8 (eight) hours as needed.   multivitamin with minerals Tabs tablet Take 1 tablet by mouth every other day. In the evening   PROBIOTIC PO Take 1 capsule by mouth every evening.   tamsulosin 0.4 MG Caps capsule Commonly known as: FLOMAX Take 2 capsules (0.8 mg total) by mouth every evening.               Durable Medical Equipment  (From admission, onward)           Start     Ordered   03/03/21 1250  For home use only DME Walker rolling  Once       Question Answer Comment  Walker: With [employer]   Patient needs a walker to treat with the following condition Imbalance      03/03/21 1250             Follow-up Information     Dion Body, MD Follow up in 1 week(s).   Specialty: Family Medicine Contact information: [place] [phone]                 Allergies  Allergen Reactions   Citalopram Other (See  Comments)    Reaction: Sexual side effects     The results of significant diagnostics from this hospitalization (including imaging, microbiology, ancillary and laboratory) are listed below for reference.   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 EXAM: CHEST - 2 VIEW COMPARISON:  Chest x-ray 03/01/2021. FINDINGS: Lung volumes are low. Opacity at the left base favored to reflect some resolving postoperative subsegmental atelectasis. No consolidative airspace disease. No pleural effusions. No definite pneumothorax no evidence of pulmonary edema. Heart size is normal. Upper mediastinal contours are within normal limits. Atherosclerotic calcifications in the thoracic aorta. Subcutaneous emphysema along the right lateral chest wall as well as the in the lower cervical regions and supraclavicular regions  bilaterally. IMPRESSION: 1. Low lung volumes with probable subsegmental atelectasis in the left lower lobe. 2. Previously noted small left apical pneumothorax is no longer confidently identified. 3. Aortic atherosclerosis. Electronically Signed   By: Vinnie Langton M.D.   On: 03/02/2021 07:31   CT HEAD WO CONTRAST (5MM)  Result Date: 03/02/2021 CLINICAL DATA:  Mental status changes and frequent falls. EXAM: CT HEAD WITHOUT CONTRAST TECHNIQUE: Contiguous axial images were obtained from the base of the skull through the vertex without intravenous contrast. COMPARISON:  Head CT 06/10/2020 FINDINGS: Brain: No evidence of acute infarction, hemorrhage, hydrocephalus, extra-axial collection or mass lesion/mass effect. Vascular: Scattered vascular calcifications but no aneurysm hyperdense vessels. Skull: No skull fracture or bone lesions. Sinuses/Orbits: Scattered ethmoid sinus disease. The mastoid air cells and middle ear cavities are clear. Other: Scattered gas is noted in the soft tissues behind  both maxillary sinuses also in the left upper neck area. Findings could be due to surgery that the patient had yesterday. IMPRESSION: 1. No acute intracranial findings or skull fracture. 2. Scattered gas in the soft tissues behind both maxillary sinuses and in the left upper neck area. Findings could be due to surgery that the patient had yesterday. Electronically Signed   By: Marijo Sanes M.D.   On: 03/02/2021 21:04   MR BRAIN WO CONTRAST  Result Date: 03/03/2021 CLINICAL DATA:  Falls and balance loss EXAM: MRI HEAD WITHOUT CONTRAST TECHNIQUE: Multiplanar, multiecho pulse sequences of the brain and surrounding structures were obtained without intravenous contrast. COMPARISON:  06/10/2020 FINDINGS: Brain: No acute infarct, mass effect or extra-axial collection. No acute or chronic hemorrhage. There is multifocal hyperintense T2-weighted signal within the white matter. Generalized volume loss without a clear lobar predilection. The midline structures are normal. Vascular: Major flow voids are preserved. Skull and upper cervical spine: Normal calvarium and skull base. Visualized upper cervical spine and soft tissues are normal. Sinuses/Orbits:No paranasal sinus fluid levels or advanced mucosal thickening. No mastoid or middle ear effusion. Normal orbits. IMPRESSION: 1. No acute intracranial abnormality. 2. Mild chronic small vessel ischemic disease and volume loss. Electronically Signed   By: Ulyses Jarred M.D.   On: 03/03/2021 00:08   DG Chest Portable 1 View  Result Date: 03/01/2021 CLINICAL DATA:  Pain with inspiration, postop from laparoscopic Nissen fundoplication EXAM: PORTABLE CHEST 1 VIEW COMPARISON:  06/10/2020, 11/29/2020 FINDINGS: Single frontal view of the chest demonstrates an unremarkable cardiac silhouette. Subcutaneous gas is seen along the right lateral chest wall and in the bilateral supraclavicular regions. A small left apical pneumothorax is also identified, volume estimated less than 5%. No  evidence of effusion. Patchy retrocardiac consolidation likely atelectasis. No acute bony abnormalities. IMPRESSION: 1. Small left apical pneumothorax, volume estimated less than 5%. 2. Subcutaneous emphysema in the bilateral supraclavicular regions and along the right lateral chest wall. 3. Patchy retrocardiac left lower lobe consolidation. Favor atelectasis over airspace disease. Critical Value/emergent results were called by telephone at the time of interpretation on 03/01/2021 at 4:22 pm to provider Arita Miss , who verbally acknowledged these results. Electronically Signed   By: Randa Ngo M.D.   On: 03/01/2021 16:22      Labs: BNP (last 3 results) No results for input(s): BNP in the last 8760 hours. Basic Metabolic Panel: Recent Labs  Lab 02/27/21 0834 03/01/21 1714 03/02/21 0523 03/02/21 2031 03/03/21 0552  NA 137  --  135 135 137  K 3.9  --  4.4 4.1 3.8  CL 106  --  105  105 106  CO2 28  --  '22 24 23  '$ GLUCOSE 99  --  132* 122* 102*  BUN 13  --  '17 20 16  '$ CREATININE 0.98 1.04 1.02 0.92 0.89  CALCIUM 9.2  --  8.8* 8.9 9.1  MG  --   --  2.0 3.9* 2.0   Liver Function Tests: Recent Labs  Lab 03/02/21 2031 03/03/21 0552  AST 124* 137*  ALT 136* 157*  ALKPHOS 73 69  BILITOT 1.1 1.3*  PROT 7.3 6.8  ALBUMIN 4.3 3.6   No results for input(s): LIPASE, AMYLASE in the last 168 hours. No results for input(s): AMMONIA in the last 168 hours. CBC: Recent Labs  Lab 02/27/21 0834 03/01/21 1714 03/02/21 0523 03/02/21 2031 03/03/21 0552  WBC 6.0 12.7* 14.8* 11.6* 9.1  NEUTROABS  --   --   --  9.7*  --   HGB 13.3 12.8* 12.0* 12.6* 12.2*  HCT 37.3* 35.2* 33.1* 35.4* 34.3*  MCV 88.0 87.8 87.1 88.1 87.3  PLT 230 201 198 207 191   Cardiac Enzymes: Recent Labs  Lab 03/03/21 0552  CKTOTAL 132   BNP: Invalid input(s): POCBNP CBG: No results for input(s): GLUCAP in the last 168 hours. D-Dimer No results for input(s): DDIMER in the last 72 hours. Hgb A1c No results for  input(s): HGBA1C in the last 72 hours. Lipid Profile No results for input(s): CHOL, HDL, LDLCALC, TRIG, CHOLHDL, LDLDIRECT in the last 72 hours. Thyroid function studies Recent Labs    03/03/21 0552  TSH 2.743   Anemia work up No results for input(s): VITAMINB12, FOLATE, FERRITIN, TIBC, IRON, RETICCTPCT in the last 72 hours. Urinalysis    Component Value Date/Time   COLORURINE STRAW (A) 03/03/2021 0630   APPEARANCEUR CLEAR (A) 03/03/2021 0630   LABSPEC 1.010 03/03/2021 0630   PHURINE 7.0 03/03/2021 0630   GLUCOSEU NEGATIVE 03/03/2021 0630   HGBUR NEGATIVE 03/03/2021 0630   BILIRUBINUR NEGATIVE 03/03/2021 0630   KETONESUR NEGATIVE 03/03/2021 0630   PROTEINUR NEGATIVE 03/03/2021 0630   NITRITE NEGATIVE 03/03/2021 0630   LEUKOCYTESUR NEGATIVE 03/03/2021 0630   Sepsis Labs Invalid input(s): PROCALCITONIN,  WBC,  LACTICIDVEN Microbiology Recent Results (from the past 240 hour(s))  SARS CORONAVIRUS 2 (TAT 6-24 HRS) Nasopharyngeal Nasopharyngeal Swab     Status: None   Collection Time: 02/27/21  8:22 AM   Specimen: Nasopharyngeal Swab  Result Value Ref Range Status   SARS Coronavirus 2 NEGATIVE NEGATIVE Final    Comment: (NOTE) SARS-CoV-2 target nucleic acids are NOT DETECTED.  The SARS-CoV-2 RNA is generally detectable in upper and lower respiratory specimens during the acute phase of infection. Negative results do not preclude SARS-CoV-2 infection, do not rule out co-infections with other pathogens, and should not be used as the sole basis for treatment or other patient management decisions. Negative results must be combined with clinical observations, patient history, and epidemiological information. The expected result is Negative.  Fact Sheet for Patients: SugarRoll.be  Fact Sheet for Healthcare Providers: https://www.woods-mathews.com/  This test is not yet approved or cleared by the [place] FDA and  has been  authorized for detection and/or diagnosis of SARS-CoV-2 by FDA under an Emergency Use Authorization (EUA). This EUA will remain  in effect (meaning this test can be used) for the duration of the COVID-19 declaration under Se ction 564(b)(1) of the Act, 21 U.S.C. section 360bbb-3(b)(1), unless the authorization is terminated or revoked sooner.  Performed at [facility], [address] [place],  [place] [address]   Resp Panel by RT-PCR (Flu A&B, Covid) Nasopharyngeal Swab     Status: None   Collection Time: 03/02/21 10:54 PM   Specimen: Nasopharyngeal Swab; Nasopharyngeal(NP) swabs in vial transport medium  Result Value Ref Range Status   SARS Coronavirus 2 by RT PCR NEGATIVE NEGATIVE Final    Comment: (NOTE) SARS-CoV-2 target nucleic acids are NOT DETECTED.  The SARS-CoV-2 RNA is generally detectable in upper respiratory specimens during the acute phase of infection. The lowest concentration of SARS-CoV-2 viral copies this assay can detect is 138 copies/mL. A negative result does not preclude SARS-Cov-2 infection and should not be used as the sole basis for treatment or other patient management decisions. A negative result may occur with  improper specimen collection/handling, submission of specimen other than nasopharyngeal swab, presence of viral mutation(s) within the areas targeted by this assay, and inadequate number of viral copies(<138 copies/mL). A negative result must be combined with clinical observations, patient history, and epidemiological information. The expected result is Negative.  Fact Sheet for Patients:  EntrepreneurPulse.com.au  Fact Sheet for Healthcare Providers:  IncredibleEmployment.be  This test is no t yet approved or cleared by the [place] FDA and  has been authorized for detection and/or diagnosis of SARS-CoV-2 by FDA under an Emergency Use Authorization (EUA). This EUA will remain  in effect (meaning  this test can be used) for the duration of the COVID-19 declaration under Section 564(b)(1) of the Act, 21 U.S.C.section 360bbb-3(b)(1), unless the authorization is terminated  or revoked sooner.       Influenza A by PCR NEGATIVE NEGATIVE Final   Influenza B by PCR NEGATIVE NEGATIVE Final    Comment: (NOTE) The Xpert Xpress SARS-CoV-2/FLU/RSV plus assay is intended as an aid in the diagnosis of influenza from Nasopharyngeal swab specimens and should not be used as a sole basis for treatment. Nasal washings and aspirates are unacceptable for Xpert Xpress SARS-CoV-2/FLU/RSV testing.  Fact Sheet for Patients: EntrepreneurPulse.com.au  Fact Sheet for Healthcare Providers: IncredibleEmployment.be  This test is not yet approved or cleared by the [place] FDA and has been authorized for detection and/or diagnosis of SARS-CoV-2 by FDA under an Emergency Use Authorization (EUA). This EUA will remain in effect (meaning this test can be used) for the duration of the COVID-19 declaration under Section 564(b)(1) of the Act, 21 U.S.C. section 360bbb-3(b)(1), unless the authorization is terminated or revoked.  Performed at [facility], [address] [place], [place] [address]      Total time spend on discharging this patient, including the last patient exam, discussing the hospital stay, instructions for ongoing care as it relates to all pertinent caregivers, as well as preparing the medical discharge records, prescriptions, and/or referrals as applicable, is 60 minutes.    Enzo Bi, MD  [facility] 03/03/2021, 12:58 PM

## 2021-03-03 NOTE — ED Notes (Signed)
Transport to inpatient unit requested for patient.

## 2021-03-03 NOTE — Evaluation (Signed)
Occupational Therapy Evaluation Patient Details Name: Cory Hoover MRN: [id number] DOB: 01/14/1951 Today's Date: 03/03/2021    History of Present Illness 70 y.o. male admitted with generalized weakness and multiple falls with medical history significant for hyperlipidemia and hypertension. Pt underwent Nissen fundoplication on [id number]. Within 12 hour span of being home, pt endured 4 falls, 3 within 30 minutes. Pt also reports 12-15 or more falls in the last year with various activities.   Clinical Impression   Pt seen for OT evaluation this date.  Pt s/p hernia repair on 03/01/2021, he has had multiple falls in the last 24 hours and reports history of 12-15 falls in the last year.  MRI negative for CVA. Pt reports he is very active, plays tennis 2 times a week and rides a bike.  Pt presents with generalized weakness in UE, and states it often feels like his knees are weak with transfers.  Upon further evaluation, it appears he demonstrates impairments with proprioception of UE and LE with testing and potentially motor planning issues.  Mild coordination impairment with finger to nose testing. He does describe some difficulty with divided attention and some select executive function skills. When discussing issues with proprioception, pt reports he has had issues getting on and off his bike, his leg not clearing the cycle and contributing to falls, he also describes not clearing the steps at times when lifting his leg to ascend the stairs.  He reported he thought his legs were crossed at the ankle when having MRI performed and upon visual inspection his legs were parallel.  Pt could not accurately move right UE to match left UE (positioned by therapist with vision occluded) in several positions.  MD in during session and agrees pt may be presenting with some neurological symptoms and would benefit from follow up at discharge.  Pt would likely benefit from OP PT for balance, safety and mobility to reduce fall  risk.  Recommend OT during hospital stay for safety with ADLs and home program for UB strengthening.  Also, continue to recommend rolling walker for patient safety given his current fall risk.      Follow Up Recommendations  No OT follow up    Equipment Recommendations  None recommended by OT    Recommendations for Other Services       Precautions / Restrictions Precautions Precautions: Fall Restrictions Weight Bearing Restrictions: No      Mobility Bed Mobility Overal bed mobility: Independent             General bed mobility comments: Performed with no bed rails, no increased time    Transfers Overall transfer level: Independent               General transfer comment: STS    Balance Overall balance assessment: Needs assistance Sitting-balance support: No upper extremity supported;Feet supported Sitting balance-Leahy Scale: Normal     Standing balance support: No upper extremity supported;During functional activity Standing balance-Leahy Scale: Poor Standing balance comment: Decreased stability during ambulation. Fair balance in static standing                           ADL either performed or assessed with clinical judgement   ADL Overall ADL's : Needs assistance/impaired Eating/Feeding: Independent   Grooming: Min guard   Upper Body Bathing: Independent   Lower Body Bathing: Modified independent   Upper Body Dressing : Modified independent   Lower Body Dressing: Min guard  Toilet Transfer: Min guard       Tub/ Banker: Min guard   Functional mobility during ADLs: Min guard General ADL Comments: Given the patient's risk for falls and the number of falls in the last 24 hours, recommend min guard assist with transfers and highly recommend use of walker for functional mobility.  Pt reports he is resistant to using walker and feels he has been so active in the past he doesn't need it.  Discussed his fall history over the past  year and recommend pt use the walker at least temporily for safety until he follows up with a neurologist regarding presentation of symptoms.     Vision Baseline Vision/History: Wears glasses Wears Glasses: At all times Additional Comments: Denies any visual changes     Perception     Praxis      Pertinent Vitals/Pain Pain Assessment: No/denies pain Pain Score: 0-No pain Pain Location: "tenderness" at surgical site Pain Descriptors / Indicators: Tender Pain Intervention(s): Limited activity within patient's tolerance;Monitored during session     Hand Dominance Right   Extremity/Trunk Assessment Upper Extremity Assessment Upper Extremity Assessment: Generalized weakness   Lower Extremity Assessment Lower Extremity Assessment: Defer to PT evaluation   Cervical / Trunk Assessment Cervical / Trunk Assessment: Normal   Communication Communication Communication: No difficulties   Cognition Arousal/Alertness: Awake/alert Behavior During Therapy: WFL for tasks assessed/performed Overall Cognitive Status: Within Functional Limits for tasks assessed                                 General Comments: A&Ox4, Pt does describe difficulties at times with divided attention to tasks and executive function skills at times which has been worsening over the last year.   General Comments       Exercises Other Exercises Other Exercises: Pt educated on PT role, POC, safety with mobility, recommendation for RW, falls prevention, safety upon discharge, activities to avoid/modify until strength increases and falls decrease. Concern regarding falls - PT spoke to MD about possible causes other than weakness. 2 trials of ambulation - w/ and w/o AD to compare for safer mobility. Extensive time spent listening to pt concerns and probing additional questions.   Shoulder Instructions      Home Living Family/patient expects to be discharged to:: Private residence Living Arrangements:  Spouse/significant other Available Help at Discharge: Family Type of Home: House Home Access: Stairs to enter [employer] of Steps: 4 steps at garage entrance Entrance Stairs-Rails: Right Solen: Two level;Able to live on main level with bedroom/bathroom Alternate Level Stairs-Number of Steps: 15 Alternate Level Stairs-Rails: Left;Right Bathroom Shower/Tub: Walk-in shower;Door   [employer] Toilet: Programmer, systems: Yes   Home Equipment: Shower seat - built in;Cane - single point;Grab bars - tub/shower          Prior Functioning/Environment Level of Independence: Independent        Comments: Very active at baseline,does not use AD, Hobbies include tennis, biking, stocks, reading and movies        OT Problem List: Decreased strength;Decreased knowledge of use of DME or AE;Impaired balance (sitting and/or standing);Pain      OT Treatment/Interventions:      OT Goals(Current goals can be found in the care plan section) Acute Rehab OT Goals Patient Stated Goal: to go home OT Goal Formulation: With patient Time For Goal Achievement: 03/10/21 Potential to Achieve Goals: Good ADL Goals Pt Will Transfer to  Toilet: with modified independence Pt/caregiver will Perform Home Exercise Program: (P) With written HEP provided  OT Frequency:     Barriers to D/C:            Co-evaluation              AM-PAC OT "6 Clicks" Daily Activity     Outcome Measure Help from another person eating meals?: None Help from another person taking care of personal grooming?: None Help from another person toileting, which includes using toliet, bedpan, or urinal?: A Little Help from another person bathing (including washing, rinsing, drying)?: A Little Help from another person to put on and taking off regular upper body clothing?: None Help from another person to put on and taking off regular lower body clothing?: A Little 6 Click Score: 21   End of Session  Equipment Utilized During Treatment: Gait belt;Rolling walker  Activity Tolerance: Patient tolerated treatment well Patient left: in chair;with call bell/phone within reach;with family/visitor present  OT Visit Diagnosis: Unsteadiness on feet (R26.81);Repeated falls (R29.6);Muscle weakness (generalized) (M62.81)                Time: [id number] OT Time Calculation (min): 55 min Charges:  OT General Charges $OT Visit: 1 Visit OT Evaluation $OT Eval Low Complexity: 1 Low OT Treatments $Self Care/Home Management : 8-22 mins  Jaelin Devincentis T Tearah [place]uls[place], OTR/L, CLT   Yari Szeliga 03/03/2021, 1:42 PM

## 2021-03-06 LAB — METHYLMALONIC ACID, SERUM: Methylmalonic Acid, Quantitative: 98 nmol/L (ref 0–378)

## 2021-03-09 ENCOUNTER — Telehealth: Payer: Self-pay | Admitting: Surgery

## 2021-03-09 NOTE — Telephone Encounter (Signed)
Patient states the the pain goes away shortly after waking up and moving around. He states that if he takes a deep breath he can feel it a little. It is not so bad that he needs narcotic pain medication he was just concerned. I let him know that this was more than likely residual gas from surgery. Possible more bothersome due to inactivity at night. Encouraged to move around a lot during the day and to be sure to lift and move his arms around. He will be coming in for a post op visit on Wednesday.

## 2021-03-09 NOTE — Telephone Encounter (Signed)
Incoming call from patient.  He had hiatal hernia repair done on 03/01/21 with Dr. Dahlia Byes.  Patient states that he is now having bilateral shoulder pain, but only at night.  During the day it doesn't bother him, but concerned of the pain that both shoulders are giving him during the night.  Wanted to make sure this is ok before going through the weekend.  Please call him.  Thank you.  n

## 2021-03-14 ENCOUNTER — Other Ambulatory Visit: Payer: Self-pay

## 2021-03-14 ENCOUNTER — Ambulatory Visit (INDEPENDENT_AMBULATORY_CARE_PROVIDER_SITE_OTHER): Payer: Medicare Other | Admitting: Surgery

## 2021-03-14 ENCOUNTER — Encounter: Payer: Self-pay | Admitting: Surgery

## 2021-03-14 VITALS — BP 147/63 | HR 63 | Temp 98.0°F | Ht 66.0 in | Wt 161.8 lb

## 2021-03-14 DIAGNOSIS — Z09 Encounter for follow-up examination after completed treatment for conditions other than malignant neoplasm: Secondary | ICD-10-CM

## 2021-03-14 NOTE — Patient Instructions (Addendum)
If you have any concerns or questions, please feel free to call our office. See follow up appointment below.     Eating Plan After Nissen Fundoplication After a Nissen fundoplication procedure, it is common to have some difficulty swallowing. The part of your body that moves food and liquid from your mouth to your stomach (esophagus) will be swollen and may feel tight. It will take several weeks or months foryour esophagus and stomach to heal. By following a special eating plan, you can prevent problems such as pain, swelling or pressure in the abdomen (bloating), gas, nausea, or diarrhea. What are tips for following this plan? Cooking Cook all foods until they are soft. Remove skins and seeds from fruits and vegetables before eating. Remove skin and gristle from meats. Grind or finely mince meats before eating. Avoid over-cooking meat. Dry, tough meat is more difficult to swallow. Avoid using oil when cooking, or use only a small amount of oil. Avoid using seasoning when cooking, or use only a small amount of seasoning. Toast bread before eating. This makes it easier to swallow. Meal planning  Eat 6-8 small meals throughout the day. Right after the surgery, have a few meals that are only clear liquids. Clear liquids include: Water. Clear fruit juice, no pulp. Chicken, beef, or vegetable broth. Gelatin. Decaffeinated tea or coffee without milk. Popsicles or shaved ice. Depending on your progress, you may move to a full liquid diet as told by your health care provider. This includes clear liquids and the following: Dairy and alternative milks, such as soy milk. Strained creamed soups. Ice cream or sherbet. Pudding. Nutritional supplement drinks. Yogurt. A few days after surgery, you may be able to start eating a diet of soft foods. You may need to eat according to this plan for several weeks. Do not eat sweets or sweetened drinks at the beginning of a meal. Doing that may cause your  stomach to empty faster than it should (dumping syndrome).  Lifestyle Always sit upright when eating or drinking. Eat slowly. Take small bites and chew food well before swallowing. Do not lie down after eating. Stay sitting up for 30 minutes or longer after each meal. Sip fluids between meals. Limit how much you drink at one time. With meals and snacks, have 4-8 oz (120-240 mL). This is equal to  cup-1 cup. Do not mix solid foods and liquids in the same mouthful. Drink enough fluid to keep your urine pale yellow. Do not chew gum or drink fluids through a straw. Doing those things may cause you to swallow extra air. General information Do not drink carbonated drinks or alcohol. Avoid foods and drinks that contain caffeine and chocolate. Avoid foods and drinks that contain citrus or tomato. Allow hot soups and drinks to cool before eating. Avoid foods that cause gas, such as beans, peas, broccoli, or cabbage. If dairy milk products cause diarrhea, avoid them or eat them in small amounts. Recommended foods Fruits Any soft-cooked fruits after skins and seeds are removed. Fruit juice. Vegetables Any soft-cooked vegetables after skins and seeds are removed. Vegetable juice. Grains Cooked cereals. Dry cereals softened with liquid. Cooked pasta, rice, or other grains. Toasted bread. Bland crackers, such as soda or graham crackers. Meats and other protein foods Tender cuts of meat, poultry, or fish after bones, skin, and gristle are removed. Poached, boiled, or scrambled eggs. Canned fish. Tofu. Creamy nut butters. Dairy Milk. Yogurt. Cottage cheese. Mild cheeses. Beverages Nutritional supplement drinks. Decaffeinated tea or coffee.  Sports drinks. Fats and oils Butter. Margarine. Mayonnaise. Vegetable oil. Smooth salad dressing. Sweets and desserts Plain hard candy. Marshmallows. Pudding. Ice cream. Gelatin. Sherbet. Seasoning and other foods Salt. Light seasonings. Mustard. Vinegar. The  items listed above may not be a complete list of recommended foods and beverages. Contact a dietitian for more information. Foods to avoid Fruits Oranges. Grapefruit. Lemons. Limes. Citrus juices. Dried fruit. Crunchy, rawfruits. Vegetables Tomato sauce. Tomato juice. Broccoli. Cauliflower. Cabbage. Brussels sprouts.Crunchy, raw vegetables. Grains High-fiber or bran cereal. Cereal with nuts, dried fruit, or coconut. Sweet breads, rolls, coffee cake, or donuts. Chewy or crusty breads. Popcorn. Meats and other protein foods Beans, peas, and lentils. Tough or fatty meats. Fried meats, chicken, or fish. Fried eggs. Nuts and seeds. Crunchy nut butters. Dairy Chocolate milk. Yogurt with chunks of fruit, nuts, seeds, or coconut. Strong cheeses. Beverages Carbonated soft drinks. Alcohol. Cocoa. Hot drinks. Fats and oils Bacon fat. Lard. Sweets and desserts Chocolate. Candy with nuts, coconut, or seeds. Peppermint. Cookies. Cakes. Pie crust. Seasoning and other foods Heavy seasonings. Chili sauce. Ketchup. Barbecue sauce. Angie Fava. Horseradish. The items listed above may not be a complete list of foods and beverages to avoid. Contact a dietitian for more information. Summary Following this eating plan after a Nissen fundoplication is an important part of healing after surgery. After surgery, you will start with a clear liquid diet before you progress to full liquids and soft foods. You may need to eat soft foods for several weeks. Avoid eating foods that cause irritation, gas, nausea, diarrhea, or swelling or pressure in the abdomen (bloating), and avoid foods that are difficult to swallow. Talk with a dietitian about which dietary choices are best for you. This information is not intended to replace advice given to you by your health care provider. Make sure you discuss any questions you have with your healthcare provider. Document Revised: 01/30/2020 Document Reviewed: 01/30/2020 Elsevier Patient  Education  [place].  Hiatal Hernia   A hiatal hernia occurs when part of the stomach slides above the muscle that separates the abdomen from the chest (diaphragm). A person can be born with a hiatal hernia (congenital), or it may develop over time. In almost all cases of hiatal hernia, only thetop part of the stomach pushes through the diaphragm. Many people have a hiatal hernia with no symptoms. The larger the hernia, the more likely it is that you will have symptoms. In some cases, a hiatal hernia allows stomach acid to flow back into the tube that carries food from your mouth to your stomach (esophagus). This may cause heartburn symptoms. Severe heartburn symptoms may mean thatyou have developed a condition called gastroesophageal reflux disease (GERD). What are the causes? This condition is caused by a weakness in the opening (hiatus) where the esophagus passes through the diaphragm to attach to the upper part of the stomach. A person may be born with a weakness in the hiatus, or aweakness can develop over time. What increases the risk? This condition is more likely to develop in: Older people. Age is a major risk factor for a hiatal hernia, especially if you are over the age of 56. Pregnant women. People who are overweight. People who have frequent constipation. What are the signs or symptoms? Symptoms of this condition usually develop in the form of GERD symptoms. Symptoms include: Heartburn. Belching. Indigestion. Trouble swallowing. Coughing or wheezing. Sore throat. Hoarseness. Chest pain. Nausea and vomiting. How is this diagnosed? This condition may be diagnosed during  testing for GERD. Tests that may be done include: X-rays of your stomach or chest. An upper gastrointestinal (GI) series. This is an X-ray exam of your GI tract that is taken after you swallow a chalky liquid that shows up clearly on the X-ray. Endoscopy. This is a procedure to look into your stomach  using a thin, flexible tube that has a tiny camera and light on the end of it. How is this treated? This condition may be treated by: Dietary and lifestyle changes to help reduce GERD symptoms. Medicines. These may include: Over-the-counter antacids. Medicines that make your stomach empty more quickly. Medicines that block the production of stomach acid (H2 blockers). Stronger medicines to reduce stomach acid (proton pump inhibitors). Surgery to repair the hernia, if other treatments are not helping. If you have no symptoms, you may not need treatment. Follow these instructions at home: Lifestyle and activity Do not use any products that contain nicotine or tobacco, such as cigarettes and e-cigarettes. If you need help quitting, ask your health care provider. Try to achieve and maintain a healthy body weight. Avoid putting pressure on your abdomen. Anything that puts pressure on your abdomen increases the amount of acid that may be pushed up into your esophagus. Avoid bending over, especially after eating. Raise the head of your bed by putting blocks under the legs. This keeps your head and esophagus higher than your stomach. Do not wear tight clothing around your chest or stomach. Try not to strain when having a bowel movement, when urinating, or when lifting heavy objects. Eating and drinking Avoid foods that can worsen GERD symptoms. These may include: Fatty foods, like fried foods. Citrus fruits, like oranges or lemon. Other foods and drinks that contain acid, like orange juice or tomatoes. Spicy food. Chocolate. Eat frequent small meals instead of three large meals a day. This helps prevent your stomach from getting too full. Eat slowly. Do not lie down right after eating. Do not eat 1-2 hours before bed. Do not drink beverages with caffeine. These include cola, coffee, cocoa, and tea. Do not drink alcohol. General instructions Take over-the-counter and prescription medicines  only as told by your health care provider. Keep all follow-up visits as told by your health care provider. This is important. Contact a health care provider if: Your symptoms are not controlled with medicines or lifestyle changes. You are having trouble swallowing. You have coughing or wheezing that will not go away. Get help right away if: Your pain is getting worse. Your pain spreads to your arms, neck, jaw, teeth, or back. You have shortness of breath. You sweat for no reason. You feel sick to your stomach (nauseous) or you vomit. You vomit blood. You have bright red blood in your stools. You have black, tarry stools. Summary A hiatal hernia occurs when part of the stomach slides above the muscle that separates the abdomen from the chest (diaphragm). A person may be born with a weakness in the hiatus, or a weakness can develop over time. Symptoms of hiatal hernia may include heartburn, trouble swallowing, or sore throat. Management of hiatal hernia includes eating frequent small meals instead of three large meals a day. Get help right away if you vomit blood, have bright red blood in your stools, or have black, tarry stools. This information is not intended to replace advice given to you by your health care provider. Make sure you discuss any questions you have with your healthcare provider. Document Revised: 06/15/2020 Document Reviewed: 06/15/2020  Elsevier Patient Education  2022 [employer]  

## 2021-03-16 NOTE — Progress Notes (Signed)
Cory Hoover is following for robotic hiatal hernia repair with mesh and Nissen fundoplication.  Cory Hoover did well however had some neurological issues postoperatively that require observation.  Work-up did not reveal any evidence of a stroke.  Cory Hoover apparently had some falls but now has improved.  Cory Hoover does have a follow-up appointment with neurology.  At this time there is no focal neurological deficits. Cory Hoover is doing well Cory Hoover is swallowing well no reflux.  Cory Hoover feels much better. No reflux  PE NAD Abd: Soft nontender.  Incisions healing well without evidence of infection.    A/P doing well.  Puzzling neurological symptoms.  Encouraged to follow-up with neurology.  No surgical issues at this time.  Mindfulness and diet.  We will follow him up in a few weeks. 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

## 2021-04-11 ENCOUNTER — Encounter: Payer: Self-pay | Admitting: Surgery

## 2021-04-11 ENCOUNTER — Other Ambulatory Visit: Payer: Self-pay

## 2021-04-11 ENCOUNTER — Ambulatory Visit (INDEPENDENT_AMBULATORY_CARE_PROVIDER_SITE_OTHER): Payer: Medicare Other | Admitting: Surgery

## 2021-04-11 VITALS — BP 143/75 | HR 64 | Temp 98.0°F | Ht 66.0 in | Wt 159.8 lb

## 2021-04-11 DIAGNOSIS — Z09 Encounter for follow-up examination after completed treatment for conditions other than malignant neoplasm: Secondary | ICD-10-CM

## 2021-04-11 NOTE — Patient Instructions (Addendum)
Please call our office if you have any questions or concerns.    GENERAL POST-OPERATIVE PATIENT INSTRUCTIONS   WOUND CARE INSTRUCTIONS:  Keep a dry clean dressing on the wound if there is drainage. The initial bandage may be removed after 24 hours.  Once the wound has quit draining you may leave it open to air.  If clothing rubs against the wound or causes irritation and the wound is not draining you may cover it with a dry dressing during the daytime.  Try to keep the wound dry and avoid ointments on the wound unless directed to do so.  If the wound becomes bright red and painful or starts to drain infected material that is not clear, please contact your physician immediately.  If the wound is mildly pink and has a thick firm ridge underneath it, this is normal, and is referred to as a healing ridge.  This will resolve over the next 4-6 weeks.  BATHING: You may shower if you have been informed of this by your surgeon. However, Please do not submerge in a tub, hot tub, or pool until incisions are completely sealed or have been told by your surgeon that you may do so.  DIET:  You may eat any foods that you can tolerate.  It is a good idea to eat a high fiber diet and take in plenty of fluids to prevent constipation.  If you do become constipated you may want to take a mild laxative or take ducolax tablets on a daily basis until your bowel habits are regular.  Constipation can be very uncomfortable, along with straining, after recent surgery.  ACTIVITY:  You are encouraged to cough and deep breath or use your incentive spirometer if you were given one, every 15-30 minutes when awake.  This will help prevent respiratory complications and low grade fevers post-operatively if you had a general anesthetic.  You may want to hug a pillow when coughing and sneezing to add additional support to the surgical area, if you had abdominal or chest surgery, which will decrease pain during these times.  You are  encouraged to walk and engage in light activity for the next two weeks.  You should not lift more than 20 pounds, until 04/12/2021 as it could put you at increased risk for complications.  Twenty pounds is roughly equivalent to a plastic bag of groceries. At that time- Listen to your body when lifting, if you have pain when lifting, stop and then try again in a few days. Soreness after doing exercises or activities of daily living is normal as you get back in to your normal routine.  MEDICATIONS:  Try to take narcotic medications and anti-inflammatory medications, such as tylenol, ibuprofen, naprosyn, etc., with food.  This will minimize stomach upset from the medication.  Should you develop nausea and vomiting from the pain medication, or develop a rash, please discontinue the medication and contact your physician.  You should not drive, make important decisions, or operate machinery when taking narcotic pain medication.  SUNBLOCK Use sun block to incision area over the next year if this area will be exposed to sun. This helps decrease scarring and will allow you avoid a permanent darkened area over your incision.  QUESTIONS:  Please feel free to call our office if you have any questions, and we will be glad to assist you.

## 2021-04-13 NOTE — Progress Notes (Signed)
Cory Hoover is status post repair of hiatal hernia with Nissen fundoplication.  He is doing very well.  No fevers no chills no dysphagia.  No reflux  PE NAD Abd: soft, nt, incisions c/d/I  A/P doing very well w/o complications RTC prn

## 2021-09-27 ENCOUNTER — Encounter: Payer: Medicare Other | Admitting: Urology

## 2021-09-27 ENCOUNTER — Encounter: Payer: Self-pay | Admitting: Urology

## 2021-11-16 ENCOUNTER — Other Ambulatory Visit: Payer: Self-pay | Admitting: Urology

## 2022-06-21 ENCOUNTER — Emergency Department
Admission: EM | Admit: 2022-06-21 | Discharge: 2022-06-21 | Disposition: A | Discharge status: Home / Self Care | Payer: Medicare Other | Attending: Emergency Medicine | Admitting: Emergency Medicine

## 2022-06-21 ENCOUNTER — Other Ambulatory Visit: Payer: Self-pay

## 2022-06-21 ENCOUNTER — Encounter: Payer: Self-pay | Admitting: Emergency Medicine

## 2022-06-21 DIAGNOSIS — I10 Essential (primary) hypertension: Secondary | ICD-10-CM | POA: Diagnosis not present

## 2022-06-21 DIAGNOSIS — M5431 Sciatica, right side: Secondary | ICD-10-CM | POA: Diagnosis present

## 2022-06-21 MED ORDER — LIDOCAINE 5 % EX PTCH: 1.0000 | MEDICATED_PATCH | Freq: Two times a day (BID) | CUTANEOUS | 0 refills | Status: AC

## 2022-06-21 MED ORDER — OXYCODONE-ACETAMINOPHEN 5-325 MG PO TABS
1.0000 | ORAL_TABLET | Freq: Once | ORAL | Status: AC
  Administered 2022-06-21: 1 via ORAL
  Filled 2022-06-21: qty 1

## 2022-06-21 MED ORDER — PREDNISONE 10 MG (21) PO TBPK: ORAL_TABLET | ORAL | 0 refills | Status: AC

## 2022-06-21 NOTE — ED Triage Notes (Signed)
Pt presents via POV with complaints of right sided sciatic nerve pain. States he has been alternating aleve & tylenol without improvement. Rating the pain 8/10. Denies falls, injury, incontinence, CP or SOB.

## 2022-06-21 NOTE — ED Provider Notes (Signed)
[facility] Provider Note    Event Date/Time   First MD Initiated Contact with Patient 06/21/22 0719     (approximate)   History   Sciatic Pain   HPI  Cory Hoover is a 71 y.o. male  Cory Hoover is a 71 y.o. male with hypertension, hyperlipidemia and who comes in with concerns for right leg pain.  Patient reports that 2 days ago he did some biking, walking and heavy working out and then yesterday he developed pain in his right leg.  He reports a shooting pain down the right leg that is worse with certain movements or laying on it.  He reports alternating Aleve and Tylenol without improvement.  He denies any falls on it.  Denies any back pain.  It is more of a right hip pain.  Denies any fevers.  Denies any hip replacements in the past.  Denies any urinary or rectal incontinence.  Denies any numbness or saddle anesthesia.  Patient is still ambulatory.  He denies any swelling the leg or discoloration of the leg.  Physical Exam   Triage Vital Signs: ED Triage Vitals  Enc Vitals Group     BP 06/21/22 0609 (!) 160/87     Pulse Rate 06/21/22 0609 64     Resp 06/21/22 0609 18     Temp 06/21/22 0609 97.9 F (36.6 C)     Temp Source 06/21/22 0609 Oral     SpO2 06/21/22 0609 98 %     Weight 06/21/22 0607 160 lb 15 oz (73 kg)     Height 06/21/22 0607 '5\' 6"'$  (1.676 m)     Head Circumference --      Peak Flow --      Pain Score 06/21/22 0607 7     Pain Loc --      Pain Edu? --      Excl. in [place]? --     Most recent vital signs: Vitals:   06/21/22 0609  BP: (!) 160/87  Pulse: 64  Resp: 18  Temp: 97.9 F (36.6 C)  SpO2: 98%     General: Awake, no distress.  CV:  Good peripheral perfusion.  Resp:  Normal effort.  Abd:  No distention.  Other:  Patient has good knee reflexes bilaterally.  Able to easily dorsiflex and plantarflex the ankles.  Sensation intact along the legs.  Equal strength although patient reports some increased pain on the right  leg.  He is got some pain that shoots down the right leg with elevation of the left leg on straight leg raise.  Therefore positive crossed straight leg raising test his abdomen is soft and nontender.  No CTL spine tenderness.  2+ distal pulses.  No swelling in the calfs or skin discoloration.  No calf tenderness.  Patient is able to ambulate around the room but does report intermittent shooting pain down his right leg.   ED Results / Procedures / Treatments   LPROCEDURES:  Critical Care performed: No  Procedures   MEDICATIONS ORDERED IN ED: Medications  oxyCODONE-acetaminophen (PERCOCET/ROXICET) 5-325 MG per tablet 1 tablet (1 tablet Oral Given 06/21/22 6945)     IMPRESSION / MDM / ASSESSMENT AND PLAN / ED COURSE  I reviewed the triage vital signs and the nursing notes.   Patient's presentation is most consistent with acute, uncomplicated illness.   Patient comes in with concerns for sciatica.  On examination patient has positive cross leg straight leg raise.  I do  not see any evidence of cellulitis, shingles, abscess.  He is able to move the joint fully but does have increasing pain that shoots down his leg.  He is got no signs of cord compression and denies any issues with urination or defecation.  His abdomen is soft and nontender I low suspicion for AAA and he is got good distal pulses throughout.  We discussed imaging but given this is a nontraumatic low back pain and no signs of red flags and opted to hold off on any imaging which patient expressed understanding.  We discussed treatment with Tylenol, ibuprofen, lidocaine patches, short course of steroids for improvement.  Offered some opioids to use at nighttime but how this can just mask the pain rather than treat it he is opted to want to hold off on this.  We discussed following up with primary care doctor for discussion of physical therapy.  Patient also provided a handout on some exercises to do.    FINAL CLINICAL IMPRESSION(S)  / ED DIAGNOSES   Final diagnoses:  Sciatica of right side     Rx / DC Orders   ED Discharge Orders     None        Note:  This document was prepared using Dragon voice recognition software and may include unintentional dictation errors.   Vanessa Polk, MD 06/21/22 [phone]

## 2022-06-21 NOTE — Discharge Instructions (Addendum)
You can take Tylenol 1 g every 8 hours and ibuprofen 400-600 every 8  with food.   You can alternate these so your getting something every 4 hours. Max 3g tylenol in a day.  Use the lidocaine patches and leave off for 12 hours at nighttime and put on for 12 hours during the day.  You can use the steroids to try to help with inflammation.  Use heating pads and continue daily activities unless pain is limiting you.  Please call your primary care doctor to try to get you into physical therapy and return for any other red flag symptoms that we discussed such as fevers, abdominal pain, numbness, urinating or defecating on yourself or any other concerns

## 2022-08-28 ENCOUNTER — Other Ambulatory Visit: Payer: Self-pay

## 2022-08-28 ENCOUNTER — Emergency Department: Payer: Medicare Other

## 2022-08-28 ENCOUNTER — Emergency Department
Admission: EM | Admit: 2022-08-28 | Discharge: 2022-08-28 | Disposition: A | Discharge status: Home / Self Care | Payer: Medicare Other | Attending: Student in an Organized Health Care Education/Training Program | Admitting: Student in an Organized Health Care Education/Training Program

## 2022-08-28 DIAGNOSIS — I2510 Atherosclerotic heart disease of native coronary artery without angina pectoris: Secondary | ICD-10-CM | POA: Diagnosis not present

## 2022-08-28 DIAGNOSIS — R410 Disorientation, unspecified: Secondary | ICD-10-CM | POA: Insufficient documentation

## 2022-08-28 DIAGNOSIS — R2681 Unsteadiness on feet: Secondary | ICD-10-CM | POA: Insufficient documentation

## 2022-08-28 DIAGNOSIS — I10 Essential (primary) hypertension: Secondary | ICD-10-CM | POA: Insufficient documentation

## 2022-08-28 DIAGNOSIS — R251 Tremor, unspecified: Secondary | ICD-10-CM | POA: Diagnosis not present

## 2022-08-28 LAB — CBC
HCT: 38.8 % — ABNORMAL LOW (ref 39.0–52.0)
Hemoglobin: 13.3 g/dL (ref 13.0–17.0)
MCH: 29.7 pg (ref 26.0–34.0)
MCHC: 34.3 g/dL (ref 30.0–36.0)
MCV: 86.6 fL (ref 80.0–100.0)
Platelets: 200 10*3/uL (ref 150–400)
RBC: 4.48 MIL/uL (ref 4.22–5.81)
RDW: 12.9 % (ref 11.5–15.5)
WBC: 7.3 10*3/uL (ref 4.0–10.5)
nRBC: 0 % (ref 0.0–0.2)

## 2022-08-28 LAB — DIFFERENTIAL
Abs Immature Granulocytes: 0.03 10*3/uL (ref 0.00–0.07)
Basophils Absolute: 0 10*3/uL (ref 0.0–0.1)
Basophils Relative: 0 %
Eosinophils Absolute: 0 10*3/uL (ref 0.0–0.5)
Eosinophils Relative: 1 %
Immature Granulocytes: 0 %
Lymphocytes Relative: 6 %
Lymphs Abs: 0.5 10*3/uL — ABNORMAL LOW (ref 0.7–4.0)
Monocytes Absolute: 0.7 10*3/uL (ref 0.1–1.0)
Monocytes Relative: 9 %
Neutro Abs: 6.1 10*3/uL (ref 1.7–7.7)
Neutrophils Relative %: 84 %

## 2022-08-28 LAB — COMPREHENSIVE METABOLIC PANEL
ALT: 25 U/L (ref 0–44)
AST: 22 U/L (ref 15–41)
Albumin: 4.6 g/dL (ref 3.5–5.0)
Alkaline Phosphatase: 93 U/L (ref 38–126)
Anion gap: 9 (ref 5–15)
BUN: 16 mg/dL (ref 8–23)
CO2: 25 mmol/L (ref 22–32)
Calcium: 9.2 mg/dL (ref 8.9–10.3)
Chloride: 99 mmol/L (ref 98–111)
Creatinine, Ser: 0.9 mg/dL (ref 0.61–1.24)
GFR, Estimated: >60 mL/min (ref >60)
Glucose, Bld: 113 mg/dL — ABNORMAL HIGH (ref 70–99)
Potassium: 3.7 mmol/L (ref 3.5–5.1)
Sodium: 133 mmol/L — ABNORMAL LOW (ref 135–145)
Total Bilirubin: 1.2 mg/dL (ref 0.3–1.2)
Total Protein: 7.8 g/dL (ref 6.5–8.1)

## 2022-08-28 LAB — MAGNESIUM: Magnesium: 1.8 mg/dL (ref 1.7–2.4)

## 2022-08-28 LAB — AMMONIA: Ammonia: <10 umol/L (ref 9–35)

## 2022-08-28 LAB — URINALYSIS, ROUTINE W REFLEX MICROSCOPIC
Bilirubin Urine: NEGATIVE
Glucose, UA: NEGATIVE mg/dL
Hgb urine dipstick: NEGATIVE
Ketones, ur: 5 mg/dL — AB
Leukocytes,Ua: NEGATIVE
Nitrite: NEGATIVE
Protein, ur: NEGATIVE mg/dL
Specific Gravity, Urine: 1.023 (ref 1.005–1.030)
pH: 5 (ref 5.0–8.0)

## 2022-08-28 LAB — PROTIME-INR
INR: 1.1 (ref 0.8–1.2)
Prothrombin Time: 14.4 seconds (ref 11.4–15.2)

## 2022-08-28 LAB — ETHANOL: Alcohol, Ethyl (B): <10 mg/dL (ref <10)

## 2022-08-28 LAB — APTT: aPTT: 30 seconds (ref 24–36)

## 2022-08-28 LAB — CBG MONITORING, ED: Glucose-Capillary: 103 mg/dL — ABNORMAL HIGH (ref 70–99)

## 2022-08-28 LAB — TROPONIN I (HIGH SENSITIVITY): Troponin I (High Sensitivity): 4 ng/L (ref <18)

## 2022-08-28 MED ORDER — SODIUM CHLORIDE 0.9% FLUSH
3.0000 mL | Freq: Once | INTRAVENOUS | Status: AC
  Administered 2022-08-28: 3 mL via INTRAVENOUS

## 2022-08-28 NOTE — ED Notes (Signed)
Patient transported to CT 

## 2022-08-28 NOTE — ED Notes (Signed)
Patient transported to MRI 

## 2022-08-28 NOTE — ED Notes (Signed)
Adding to triage note, pt describes issues with hands not being able to type on keyboard at home. Pt states "sometimes my fingers will just press down on a key for no reason." Pt's wife told this RN "his left leg went limp when we tried getting out of the car." Pt stated he couldn't use his leg but the sensation went away pretty quickly. Pt describes all these events as intermittent but worse in the morning.

## 2022-08-28 NOTE — ED Provider Notes (Signed)
[facility] Provider Note    Event Date/Time   First MD Initiated Contact with Patient 08/28/22 1550     (approximate)   History   Weakness   HPI  Cory Hoover is a 72 y.o. male history of BPH CAD, anxiety on Lexapro HLD hypertension presents to the ER for evaluation of several months of intermittent unsteadiness and increasing forgetfulness.  States that had an episode today where he got up this morning and felt like he was get a fall because his legs "would not do what I was telling them to do.  "Denies any headache no chest pain or shortness of breath no nausea or vomiting.  Spouse at bedside notes that she is observed some increasing tremor and lack of coordination of his upper extremities in the past few weeks.  No report of actual fall or injury.  No history of stroke.     Physical Exam   Triage Vital Signs: ED Triage Vitals  Enc Vitals Group     BP 08/28/22 1445 (!) 162/73     Pulse Rate 08/28/22 1445 76     Resp 08/28/22 1445 18     Temp 08/28/22 1445 98.4 F (36.9 C)     Temp src --      SpO2 08/28/22 1445 100 %     Weight --      Height --      Head Circumference --      Peak Flow --      Pain Score 08/28/22 1444 0     Pain Loc --      Pain Edu? --      Excl. in [place]? --     Most recent vital signs: Vitals:   08/28/22 1800 08/28/22 1922  BP: (!) 153/62 (!) 156/69  Pulse: 79 68  Resp: (!) 22 18  Temp:  99.2 F (37.3 C)  SpO2: 97% 98%     Constitutional: Alert  Eyes: Conjunctivae are normal.  Head: Atraumatic. Nose: No congestion/rhinnorhea. Mouth/Throat: Mucous membranes are moist.   Neck: Painless ROM.  Cardiovascular:   Good peripheral circulation. Respiratory: Normal respiratory effort.  No retractions.  Gastrointestinal: Soft and nontender.  Musculoskeletal:  no deformity Neurologic: Bilateral intention tremor.  No cogwheel rigidity.  Sensation is intact to light touch throughout.  No neglect.  Slight dysmetria  with finger-nose-finger and heel-to-shin bilaterally. Skin:  Skin is warm, dry and intact. No rash noted. Psychiatric: Mood and affect are normal. Speech and behavior are normal.    ED Results / Procedures / Treatments   Labs (all labs ordered are listed, but only abnormal results are displayed) Labs Reviewed  CBC - Abnormal; Notable for the following components:      Result Value   HCT 38.8 (*)    All other components within normal limits  DIFFERENTIAL - Abnormal; Notable for the following components:   Lymphs Abs 0.5 (*)    All other components within normal limits  COMPREHENSIVE METABOLIC PANEL - Abnormal; Notable for the following components:   Sodium 133 (*)    Glucose, Bld 113 (*)    All other components within normal limits  URINALYSIS, ROUTINE W REFLEX MICROSCOPIC - Abnormal; Notable for the following components:   Color, Urine YELLOW (*)    APPearance CLEAR (*)    Ketones, ur 5 (*)    All other components within normal limits  CBG MONITORING, ED - Abnormal; Notable for the following components:   Glucose-Capillary 103 (*)  All other components within normal limits  PROTIME-INR  APTT  ETHANOL  AMMONIA  MAGNESIUM  TROPONIN I (HIGH SENSITIVITY)     EKG  ED ECG REPORT I, Merlyn Lot, the attending physician, personally viewed and interpreted this ECG.   Date: 08/28/2022  EKG Time: 14:53  Rate: 75  Rhythm: sinus  Axis: normal  Intervals: normal  ST&T Change: no stemi, no depressions    RADIOLOGY Please see ED Course for my review and interpretation.  I personally reviewed all radiographic images ordered to evaluate for the above acute complaints and reviewed radiology reports and findings.  These findings were personally discussed with the patient.  Please see medical record for radiology report.    PROCEDURES:  Critical Care performed: No  Procedures   MEDICATIONS ORDERED IN ED: Medications  sodium chloride flush (NS) 0.9 % injection 3  mL (3 mLs Intravenous Given 08/28/22 1610)     IMPRESSION / MDM / ASSESSMENT AND PLAN / ED COURSE  I reviewed the triage vital signs and the nursing notes.                              Differential diagnosis includes, but is not limited to, Dehydration, sepsis, pna, uti, hypoglycemia, cva, drug effect, withdrawal,   Patient presenting to the ER for evaluation of symptoms as described above.  Based on symptoms, risk factors and considered above differential, this presenting complaint could reflect a potentially life-threatening illness therefore the patient will be placed on continuous pulse oximetry and telemetry for monitoring.  Laboratory evaluation will be sent to evaluate for the above complaints.      Clinical Course as of 08/28/22 2005  Wed Aug 28, 2022  1559 CT head on my review and interpretation without evidence of mass or edema.  Will await formal radiology report. [PR]  1901 MRI imaging without acute abnormality.  Still awaiting urinalysis. [PR]  1946 Patient reassessed.  Imaging is reassuring.  No sign of stroke.  New cervical pathology to explain patient's presentation.  Further review of his past medical history had similar presentation in 2022 was evaluated by PT and at that time was found to have issues with proprioception and told to follow-up with neurology but patient did not.  He does feel that this is progression of similar symptoms.  This point I do believe and reiterated the importance of follow-up with neurology.  Based on duration of symptoms do think that further workup is appropriate as an outpatient.  Patient family agreeable with plan. [PR]    Clinical Course User Index [PR] Merlyn Lot, MD      FINAL CLINICAL IMPRESSION(S) / ED DIAGNOSES   Final diagnoses:  Unsteadiness on feet  Tremor, unspecified  Confusion     Rx / DC Orders   ED Discharge Orders     None        Note:  This document was prepared using Dragon voice recognition software  and may include unintentional dictation errors.    Merlyn Lot, MD 08/28/22 2005

## 2022-08-28 NOTE — ED Notes (Signed)
Pt returned from MRI °

## 2022-08-28 NOTE — ED Triage Notes (Addendum)
Pt comes with c/o increased weakness. Pt states he has had some difficulty walking for last few months. Pt states it has gotten worse. Pt states fatigue. Pt states he feels lightheaded.   Pt stats Bp has been running low at home. Pt also states he has moments of losing the ability to speak and find his words. Pt states others will finish his sentences. Pt having trouble focusing. Pt is not on thinners. Pt states no blurry vision or numbness.

## 2022-09-04 IMAGING — MR MR HEAD W/O CM
12 series · 47 of 48 positions shown · non-contrast
Comparison: 06/10/2020

CLINICAL DATA: Falls and balance loss

EXAM:
MRI HEAD WITHOUT CONTRAST
TECHNIQUE: Multiplanar, multiecho pulse sequences of the brain and surrounding
structures were obtained without intravenous contrast.

[Series 5: ax dwi_tracew · axial · 3.0mm · 0.65mm/px · z∈[-107,+41]mm · 4 of 46 slices shown]
[im 1/46]
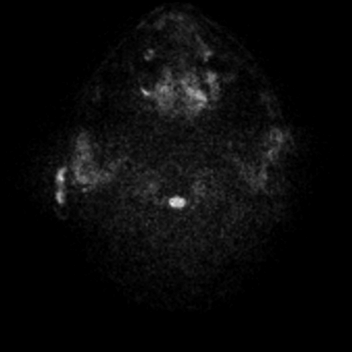
[im 16/46]
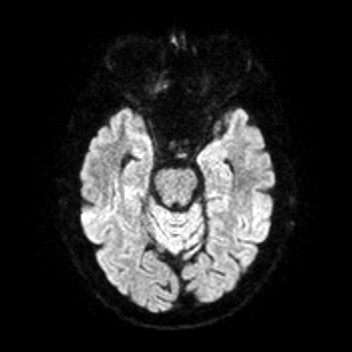
[im 31/46]
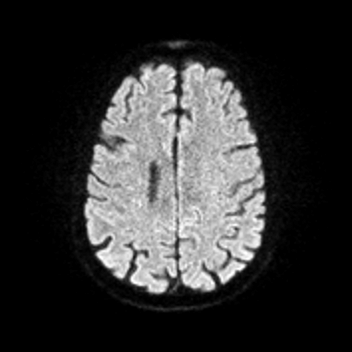
[im 46/46]
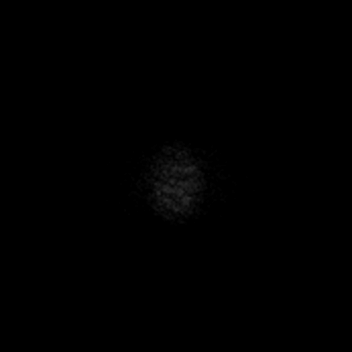

[Series 6: ax dwi_adc · axial · 3.0mm · 0.65mm/px · z∈[-107,+41]mm · 3 of 46 slices shown]
[im 1/46]
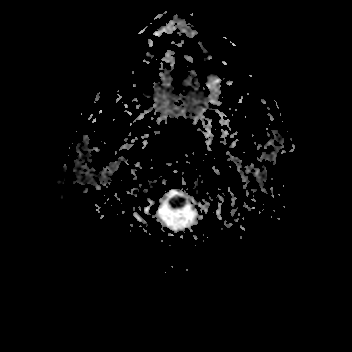
[im 23/46]
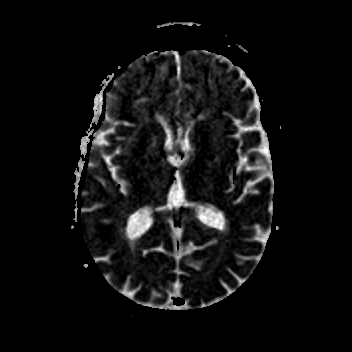
[im 46/46]
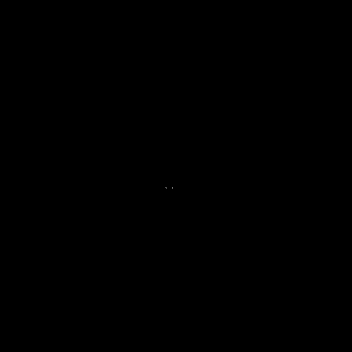

[Series 7: cor dwi_tracew · coronal · 5.0mm · 0.60mm/px · 3 of 36 slices shown]
[im 1/36]
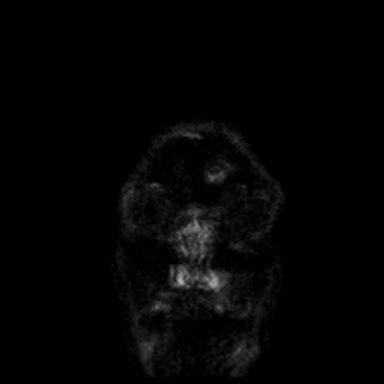
[im 18/36]
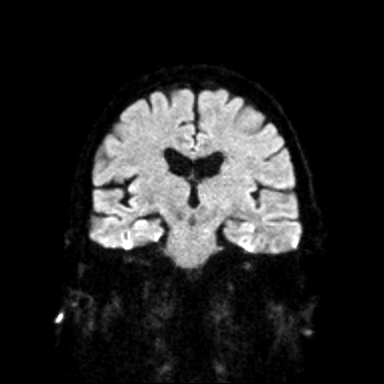
[im 36/36]
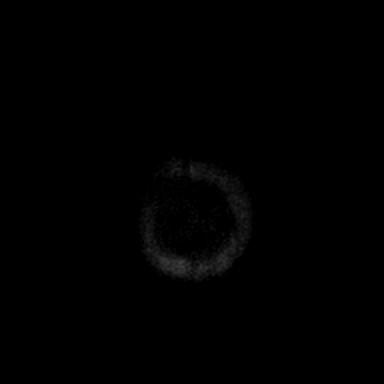

[Series 8: cor dwi_adc · coronal · 5.0mm · 0.60mm/px · 3 of 36 slices shown]
[im 1/36]
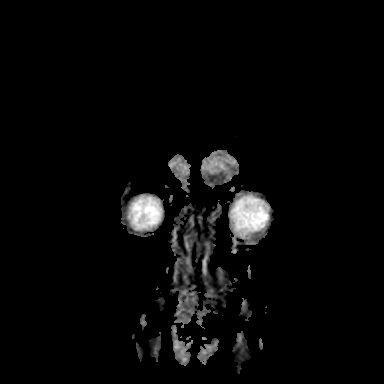
[im 18/36]
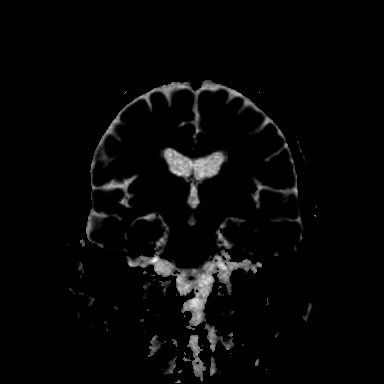
[im 36/36]
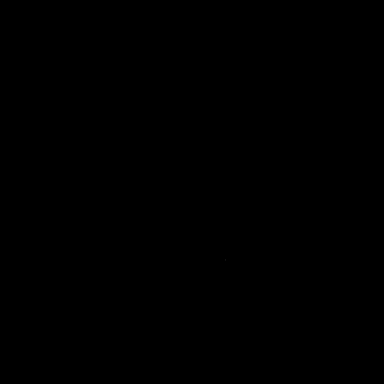

[Series 9: T1 · sagittal · 5.0mm · 0.62mm/px · 2 of 22 slices shown (1 of 2)]
[im 1/22]
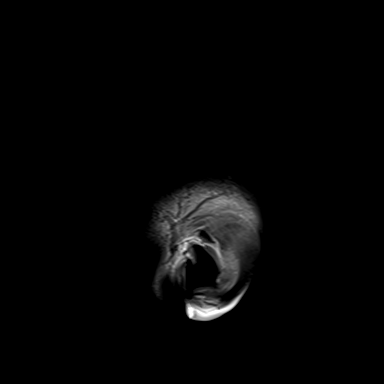
[im 22/22]
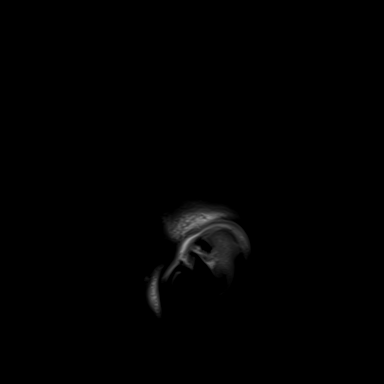

[Series 10: T2 · axial · 5.0mm · 0.53mm/px · z∈[-103,+39]mm · 2 of 25 slices shown (1 of 2)]
[im 1/25]
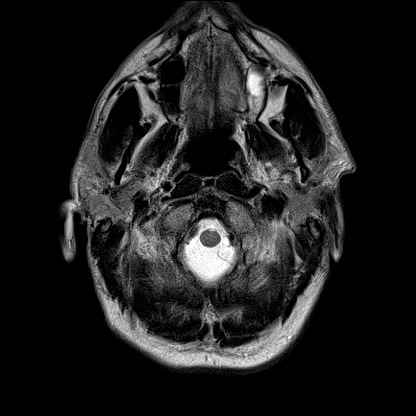
[im 25/25]
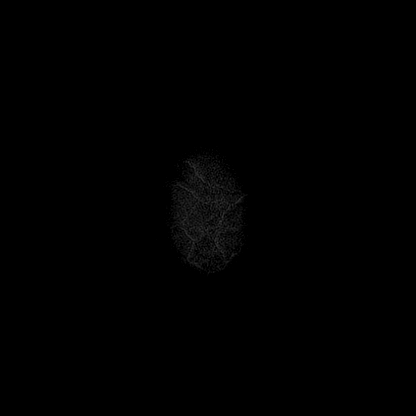

[Series 11: mag_images · axial · 3.0mm · 0.90mm/px · z∈[-120,+56]mm · 4 of 60 slices shown]
[im 1/60]
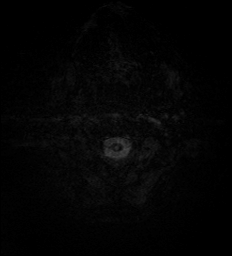
[im 20/60]
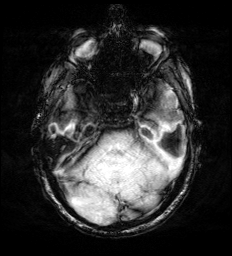
[im 40/60]
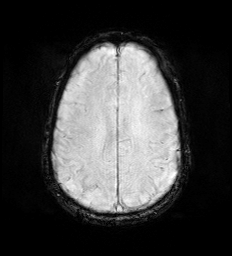
[im 60/60]
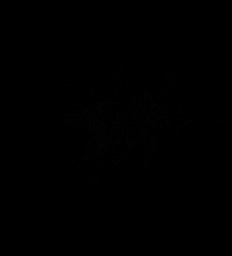

[Series 12: pha_images · axial · 3.0mm · 0.90mm/px · z∈[-120,+47]mm · 4 of 57 slices shown]
[im 1/57]
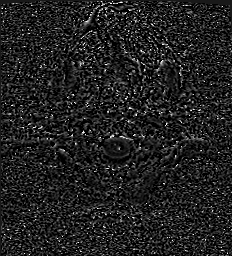
[im 19/57]
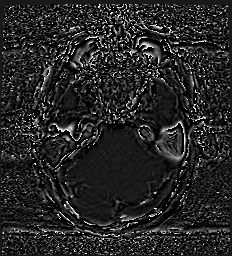
[im 38/57]
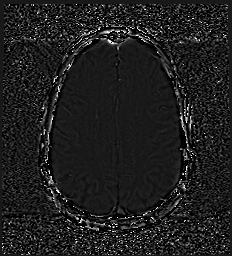
[im 57/57]
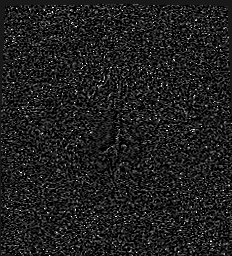

[Series 13: swi_images · axial · 3.0mm · 0.90mm/px · z∈[-120,+56]mm · 4 of 60 slices shown]
[im 1/60]
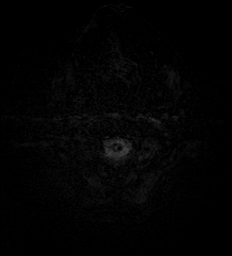
[im 20/60]
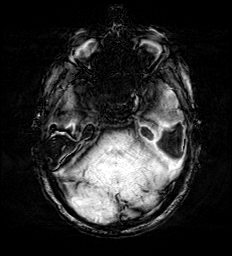
[im 40/60]
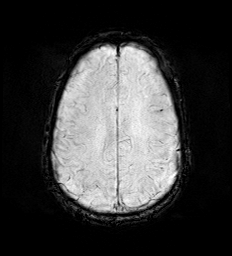
[im 60/60]
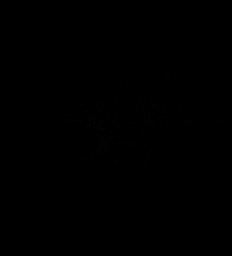

[Series 15: FLAIR · axial · 3.0mm · 0.53mm/px · z∈[-112,+48]mm · 4 of 55 slices shown]
[im 1/55]
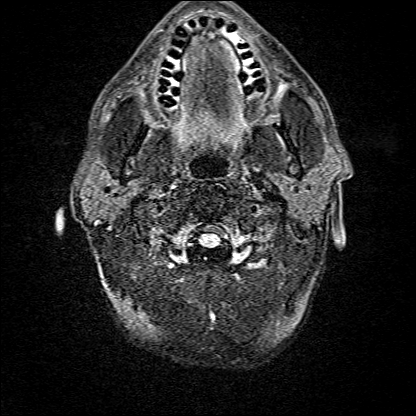
[im 19/55]
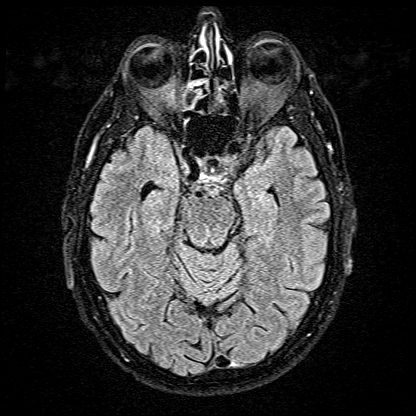
[im 37/55]
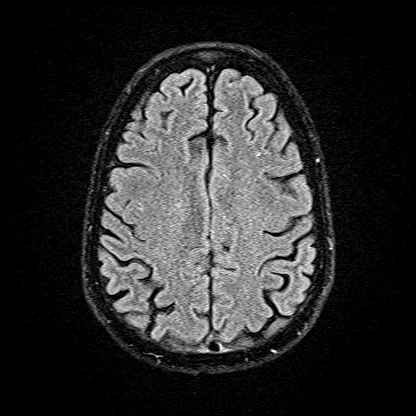
[im 55/55]
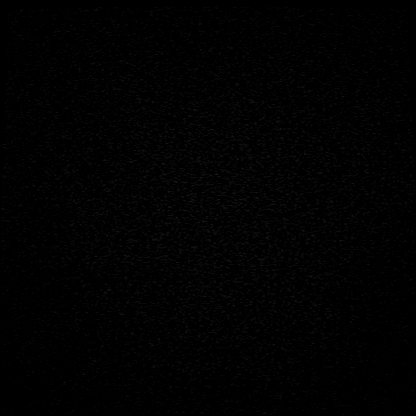

[Series 16: T1 · axial · 1.0mm · 0.98mm/px · z∈[-121,+53]mm · 12 of 174 slices shown (2 of 2)]
[im 1/174]
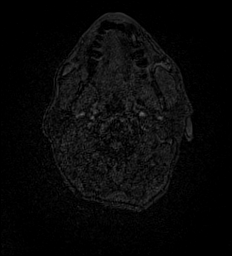
[im 15/174]
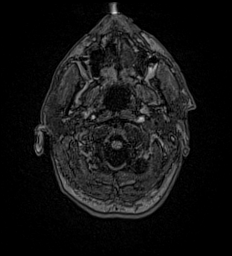
[im 29/174]
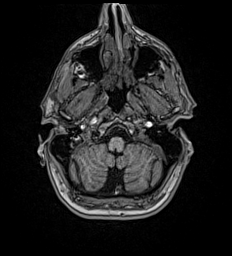
[im 44/174]
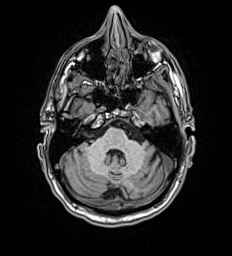
[im 58/174]
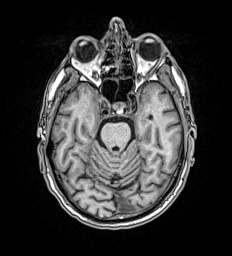
[im 73/174]
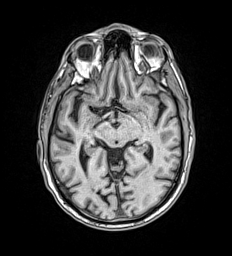
[im 87/174]
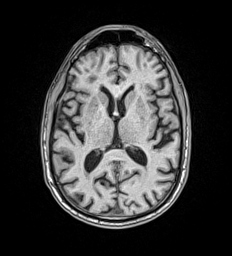
[im 101/174]
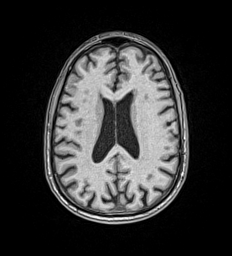
[im 116/174]
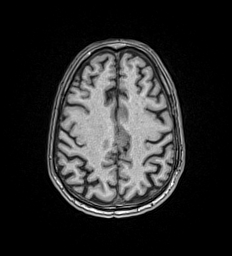
[im 130/174]
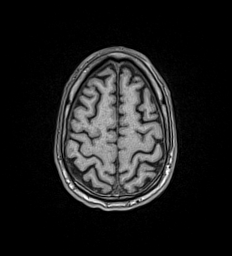
[im 145/174]
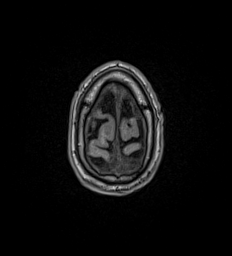
[im 174/174]
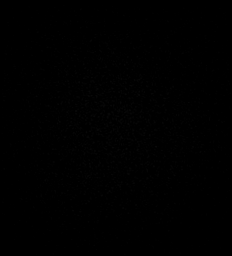

[Series 17: T2 · coronal · 5.0mm · 0.57mm/px · 2 of 28 slices shown (2 of 2)]
[im 1/28]
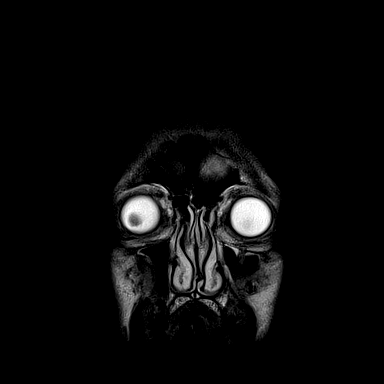
[im 28/28]
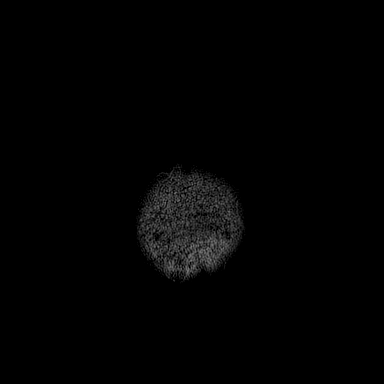

[47 of 48 positions shown; findings below may reference images not displayed]

FINDINGS: Brain: No acute infarct, mass effect or extra-axial collection. No
acute or chronic hemorrhage. There is multifocal hyperintense
T2-weighted signal within the white matter. Generalized volume loss
without a clear lobar predilection. The midline structures are
normal.

Vascular: Major flow voids are preserved.

Skull and upper cervical spine: Normal calvarium and skull base.
Visualized upper cervical spine and soft tissues are normal.

Sinuses/Orbits:No paranasal sinus fluid levels or advanced mucosal
thickening. No mastoid or middle ear effusion. Normal orbits.
IMPRESSION: 1. No acute intracranial abnormality.
2. Mild chronic small vessel ischemic disease and volume loss.

## 2022-09-04 IMAGING — CT CT HEAD W/O CM
3 series · 15 of 47 positions shown, 18 images · non-contrast
Comparison: Head CT 06/10/2020

CLINICAL DATA: Mental status changes and frequent falls.

EXAM:
CT HEAD WITHOUT CONTRAST
TECHNIQUE: Contiguous axial images were obtained from the base of the skull
through the vertex without intravenous contrast.

[Series 2: head wo · axial · 0.45mm/px · z∈[+212,+337]mm · 9 of 31 slices shown, 12 images]
[im 3/31  brain]
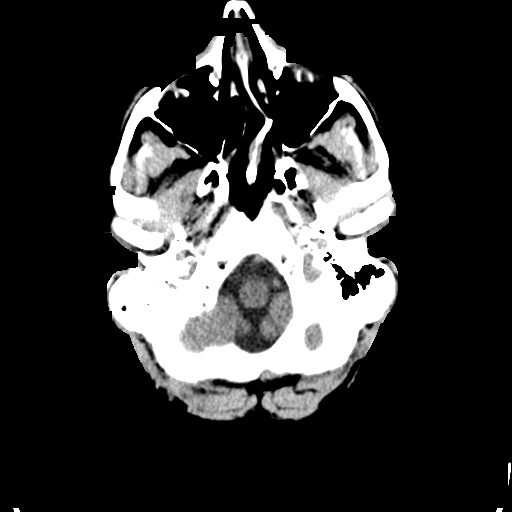
[im 3/31  bone]
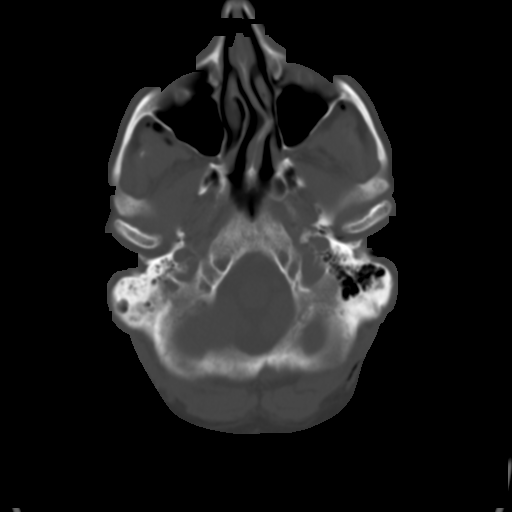
[im 6/31  brain]
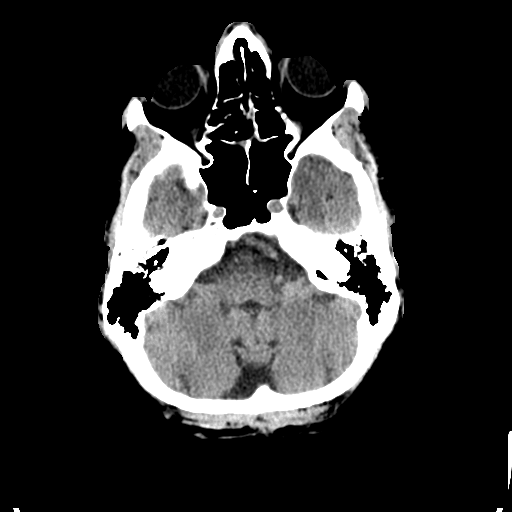
[im 9/31  brain]
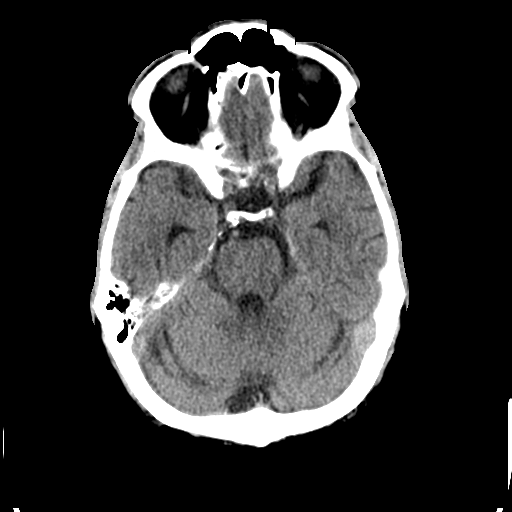
[im 12/31  brain]
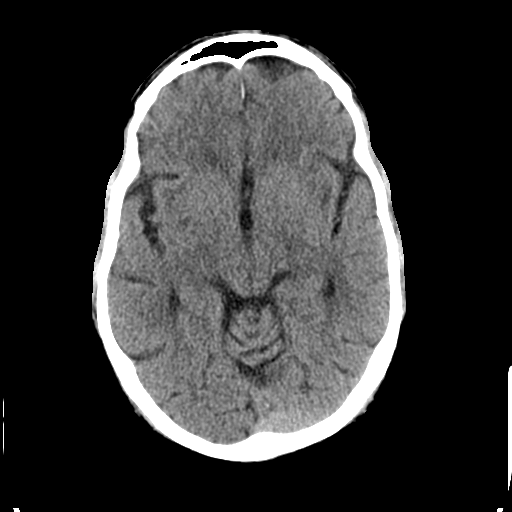
[im 16/31  brain]
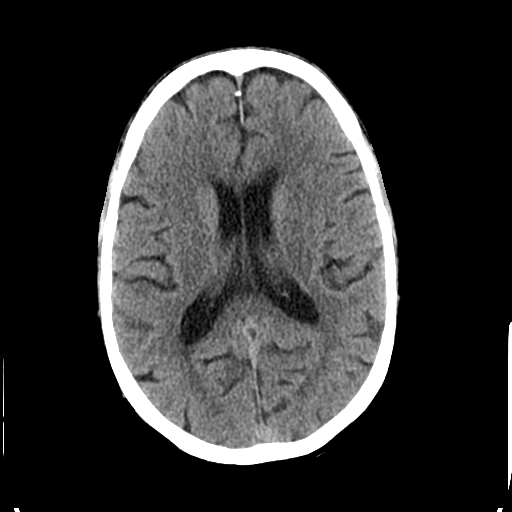
[im 16/31  bone]
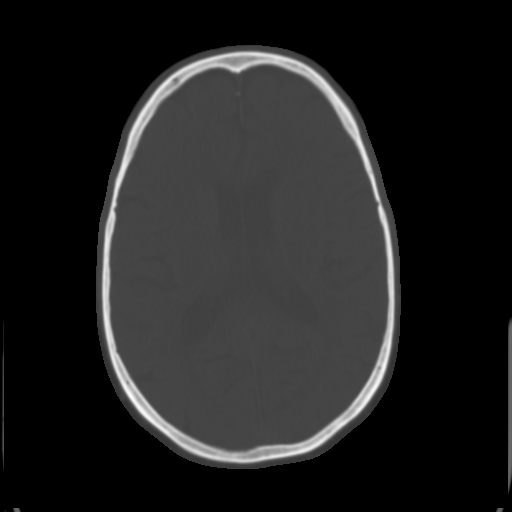
[im 19/31  brain]
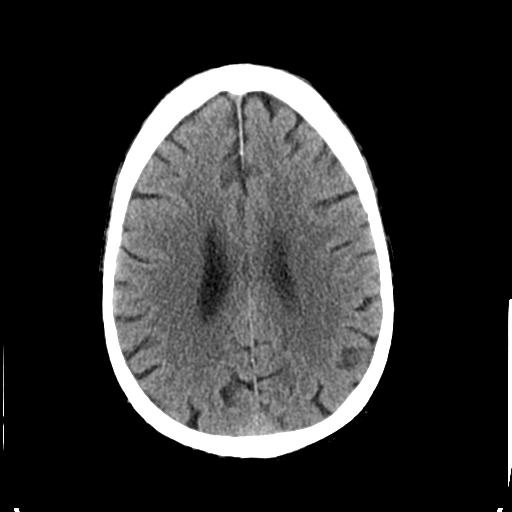
[im 22/31  brain]
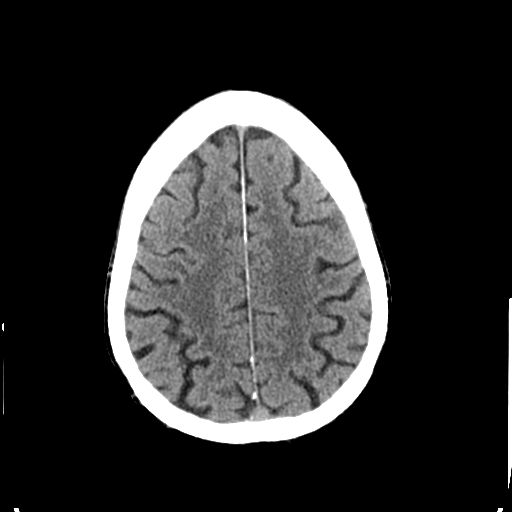
[im 25/31  brain]
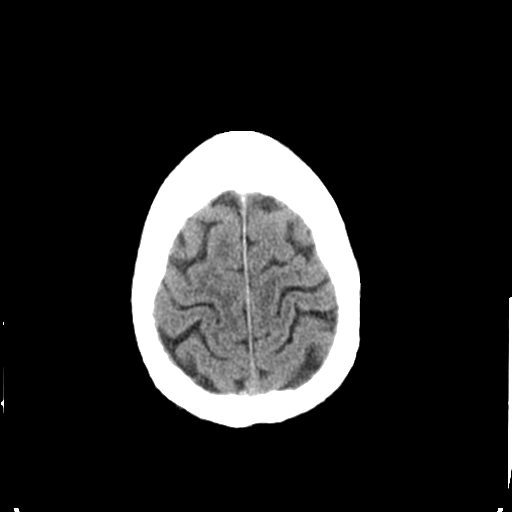
[im 28/31  brain]
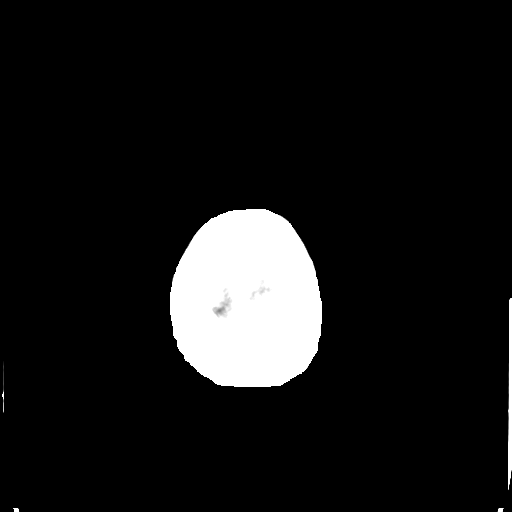
[im 28/31  bone]
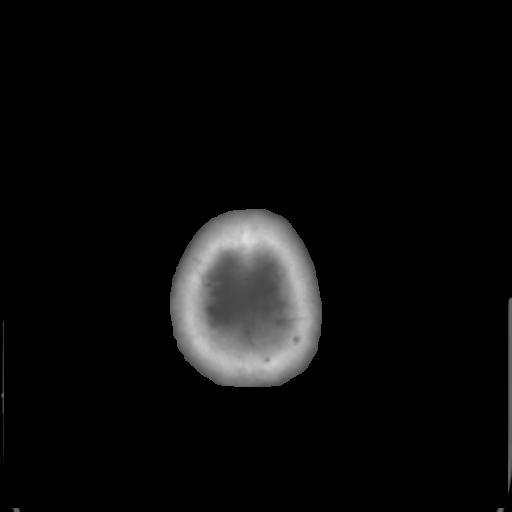

[Series 4: coronal soft tissue · coronal · 0.30mm/px · 3 of 68 slices shown]
[im 23/68  brain]
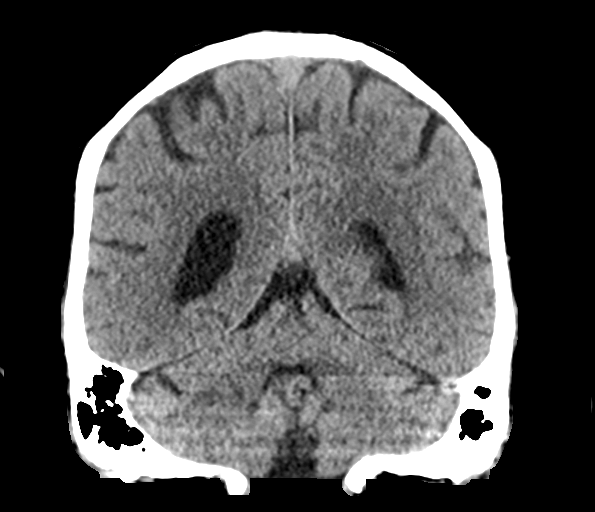
[im 30/68  brain]
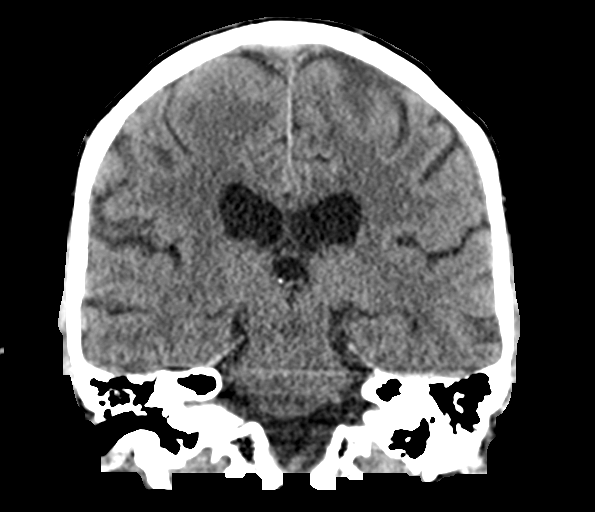
[im 38/68  brain]
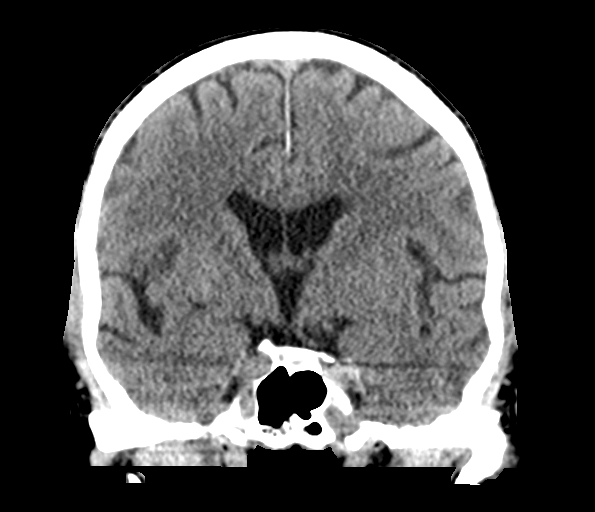

[Series 5: sagittal soft tissue · sagittal · 0.30mm/px · 3 of 52 slices shown]
[im 18/52  brain]
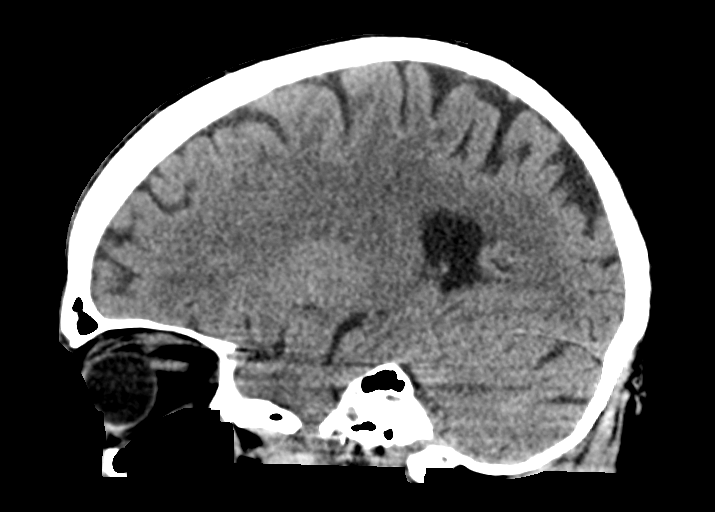
[im 26/52  brain]
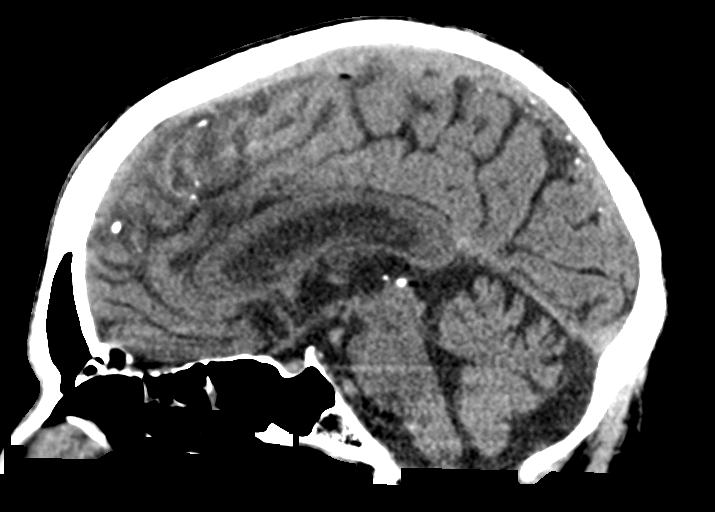
[im 35/52  brain]
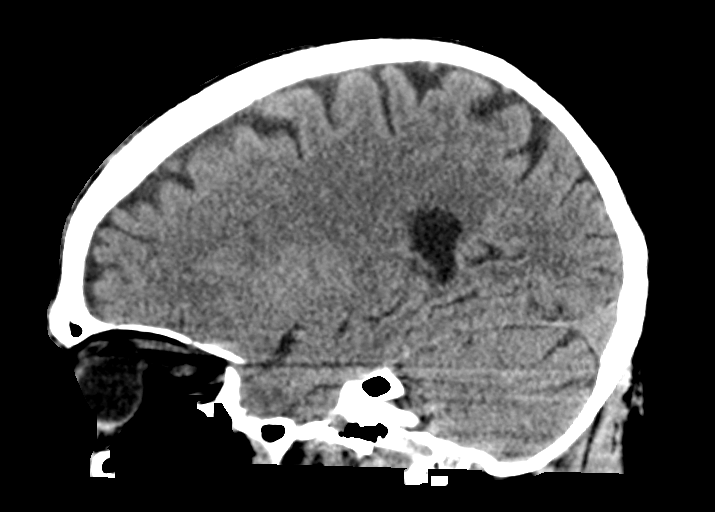

[15 of 47 positions shown; findings below may reference images not displayed]

FINDINGS: Brain: No evidence of acute infarction, hemorrhage, hydrocephalus,
extra-axial collection or mass lesion/mass effect.

Vascular: Scattered vascular calcifications but no aneurysm
hyperdense vessels.

Skull: No skull fracture or bone lesions.

Sinuses/Orbits: Scattered ethmoid sinus disease. The mastoid air
cells and middle ear cavities are clear.

Other: Scattered gas is noted in the soft tissues behind both
maxillary sinuses also in the left upper neck area. Findings could
be due to surgery that the patient had yesterday.
IMPRESSION: 1. No acute intracranial findings or skull fracture.
2. Scattered gas in the soft tissues behind both maxillary sinuses
and in the left upper neck area. Findings could be due to surgery
that the patient had yesterday.

## 2022-09-09 ENCOUNTER — Encounter: Payer: Self-pay | Admitting: *Deleted

## 2022-09-10 ENCOUNTER — Ambulatory Visit: Payer: Medicare Other | Admitting: Anesthesiology

## 2022-09-10 ENCOUNTER — Encounter
Admission: RE | Disposition: A | Discharge status: Home / Self Care | Payer: Self-pay | Source: Home / Self Care | Attending: Gastroenterology

## 2022-09-10 ENCOUNTER — Other Ambulatory Visit: Payer: Self-pay

## 2022-09-10 ENCOUNTER — Ambulatory Visit
Admission: RE | Admit: 2022-09-10 | Discharge: 2022-09-10 | Disposition: A | Discharge status: Home / Self Care | Payer: Medicare Other | Attending: Gastroenterology | Admitting: Gastroenterology

## 2022-09-10 ENCOUNTER — Encounter: Payer: Self-pay | Admitting: *Deleted

## 2022-09-10 DIAGNOSIS — I10 Essential (primary) hypertension: Secondary | ICD-10-CM | POA: Diagnosis not present

## 2022-09-10 DIAGNOSIS — Z1211 Encounter for screening for malignant neoplasm of colon: Secondary | ICD-10-CM | POA: Diagnosis present

## 2022-09-10 DIAGNOSIS — K5730 Diverticulosis of large intestine without perforation or abscess without bleeding: Secondary | ICD-10-CM | POA: Diagnosis not present

## 2022-09-10 DIAGNOSIS — D123 Benign neoplasm of transverse colon: Secondary | ICD-10-CM | POA: Insufficient documentation

## 2022-09-10 DIAGNOSIS — I739 Peripheral vascular disease, unspecified: Secondary | ICD-10-CM | POA: Insufficient documentation

## 2022-09-10 DIAGNOSIS — I2510 Atherosclerotic heart disease of native coronary artery without angina pectoris: Secondary | ICD-10-CM | POA: Diagnosis not present

## 2022-09-10 DIAGNOSIS — N400 Enlarged prostate without lower urinary tract symptoms: Secondary | ICD-10-CM | POA: Diagnosis not present

## 2022-09-10 DIAGNOSIS — E785 Hyperlipidemia, unspecified: Secondary | ICD-10-CM | POA: Diagnosis not present

## 2022-09-10 DIAGNOSIS — K641 Second degree hemorrhoids: Secondary | ICD-10-CM | POA: Diagnosis not present

## 2022-09-10 DIAGNOSIS — K219 Gastro-esophageal reflux disease without esophagitis: Secondary | ICD-10-CM | POA: Diagnosis not present

## 2022-09-10 HISTORY — PX: COLONOSCOPY WITH PROPOFOL: SHX5780

## 2022-09-10 SURGERY — COLONOSCOPY WITH PROPOFOL
Anesthesia: General

## 2022-09-10 MED ORDER — SODIUM CHLORIDE 0.9 % IV SOLN: INTRAVENOUS | Status: DC

## 2022-09-10 MED ORDER — PROPOFOL 10 MG/ML IV BOLUS
INTRAVENOUS | Status: DC | PRN
  Administered 2022-09-10 (×2): 20 mg via INTRAVENOUS
  Administered 2022-09-10: 60 mg via INTRAVENOUS

## 2022-09-10 MED ORDER — LIDOCAINE HCL (CARDIAC) PF 100 MG/5ML IV SOSY
PREFILLED_SYRINGE | INTRAVENOUS | Status: DC | PRN
  Administered 2022-09-10: 100 mg via INTRAVENOUS

## 2022-09-10 MED ORDER — PROPOFOL 500 MG/50ML IV EMUL
INTRAVENOUS | Status: DC | PRN
  Administered 2022-09-10: 165 ug/kg/min via INTRAVENOUS

## 2022-09-10 NOTE — Interval H&P Note (Signed)
History and Physical Interval Note:  09/10/2022 12:58 PM  Cory Hoover  has presented today for surgery, with the diagnosis of CCA SCREEN.  The various methods of treatment have been discussed with the patient and family. After consideration of risks, benefits and other options for treatment, the patient has consented to  Procedure(s): COLONOSCOPY WITH PROPOFOL (N/A) as a surgical intervention.  The patient's history has been reviewed, patient examined, no change in status, stable for surgery.  I have reviewed the patient's chart and labs.  Questions were answered to the patient's satisfaction.     Lesly Rubenstein  Ok to proceed with colonoscopy

## 2022-09-10 NOTE — H&P (Signed)
Outpatient short stay form Pre-procedure 09/10/2022  Lesly Rubenstein, MD  Primary Physician: Dion Body, MD  Reason for visit:  Screening colonoscopy  History of present illness:    72 y/o gentleman with history of hypertension, anxiety, and HLD here for screening colonoscopy. Had normal colonoscopy 10 years ago. No blood thinners. History of nissen fundoplication. No family history of GI malignancies.    Current Facility-Administered Medications:    0.9 %  sodium chloride infusion, , Intravenous, Continuous, Josedaniel Haye, Hilton Cork, MD, Last Rate: 20 mL/hr at 09/10/22 1214, New Bag at 09/10/22 1214  Medications Prior to Admission  Medication Sig Dispense Refill Last Dose   atorvastatin (LIPITOR) 40 MG tablet Hold until followup with your PCP, due to your elevated liver enzymes and worsening weakness.  1 09/09/2022   escitalopram (LEXAPRO) 5 MG tablet Take 5 mg by mouth daily.      tamsulosin (FLOMAX) 0.4 MG CAPS capsule Take 2 capsules (0.8 mg total) by mouth every evening.   09/09/2022   acetaminophen (TYLENOL) 500 MG tablet Take 1,000 mg by mouth every 8 (eight) hours as needed (pain).      amLODipine (NORVASC) 2.5 MG tablet Take 2.5 mg by mouth every evening. (Patient not taking: Reported on 09/10/2022)   Not Taking   clopidogrel (PLAVIX) 75 MG tablet Take 75 mg by mouth every evening. (Patient not taking: Reported on 09/10/2022)   Not Taking   ibuprofen (ADVIL) 200 MG tablet Take 400 mg by mouth every 8 (eight) hours as needed.      Multiple Vitamin (MULTIVITAMIN WITH MINERALS) TABS tablet Take 1 tablet by mouth every other day. In the evening   09/07/2022   predniSONE (STERAPRED UNI-PAK 21 TAB) 10 MG (21) TBPK tablet Take as directed on packaging (Patient not taking: Reported on 09/10/2022) 21 each 0 Completed Course   Probiotic Product (PROBIOTIC PO) Take 1 capsule by mouth every evening.        Allergies  Allergen Reactions   Citalopram Other (See Comments)    Reaction:  Sexual side effects     Past Medical History:  Diagnosis Date   Anxiety    Aortic atherosclerosis (HCC)    BPH (benign prostatic hyperplasia)    Coronary artery disease    Current use of long term anticoagulation    Clopidogrel   Diverticulosis    ED (erectile dysfunction)    GERD (gastroesophageal reflux disease)    History of hiatal hernia    History of impacted cerumen    HLD (hyperlipidemia)    HTN (hypertension)    PVD (peripheral vascular disease) ([place])     Review of systems:  Otherwise negative.    Physical Exam  Gen: Alert, oriented. Appears stated age.  HEENT: PERRLA. Lungs: No respiratory distress CV: RRR Abd: soft, benign, no masses Ext: No edema    Planned procedures: Proceed with colonoscopy. The patient understands the nature of the planned procedure, indications, risks, alternatives and potential complications including but not limited to bleeding, infection, perforation, damage to internal organs and possible oversedation/side effects from anesthesia. The patient agrees and gives consent to proceed.  Please refer to procedure notes for findings, recommendations and patient disposition/instructions.     Lesly Rubenstein, MD [facility]

## 2022-09-10 NOTE — Anesthesia Procedure Notes (Signed)
Procedure Name: General with mask airway Date/Time: 09/10/2022 1:08 PM  Performed by: Kelton Pillar, CRNAPre-anesthesia Checklist: Patient identified, Emergency Drugs available, Suction available and Patient being monitored Oxygen Delivery Method: Simple face mask Induction Type: IV induction Placement Confirmation: positive ETCO2, CO2 detector and breath sounds checked- equal and bilateral Dental Injury: Teeth and Oropharynx as per pre-operative assessment

## 2022-09-10 NOTE — Transfer of Care (Signed)
Immediate Anesthesia Transfer of Care Note  Patient: YOUNUS GUARNIERI  Procedure(s) Performed: COLONOSCOPY WITH PROPOFOL  Patient Location: Endoscopy Unit  Anesthesia Type:General  Level of Consciousness: drowsy and patient cooperative  Airway & Oxygen Therapy: Patient Spontanous Breathing and Patient connected to face mask oxygen  Post-op Assessment: Report given to RN and Post -op Vital signs reviewed and stable  Post vital signs: Reviewed and stable  Last Vitals:  Vitals Value Taken Time  BP 124/67 09/10/22 1324  Temp    Pulse 61 09/10/22 1324  Resp 18 09/10/22 1324  SpO2 98 % 09/10/22 1324  Vitals shown include unvalidated device data.  Last Pain:  Vitals:   09/10/22 1212  TempSrc: Temporal  PainSc: 0-No pain         Complications: No notable events documented.

## 2022-09-10 NOTE — Op Note (Signed)
[facility] Gastroenterology Patient Name: Cory Hoover Procedure Date: 09/10/2022 12:52 PM MRN: [id number] Account #: [id number] Date of Birth: January 18, 1951 Admit Type: Outpatient Age: 72 Room: [facility] ENDO ROOM 1 Gender: Male Note Status: Finalized Instrument Name: Colonoscope [id number] Procedure:             Colonoscopy Indications:           Screening for colorectal malignant neoplasm Providers:             Andrey Farmer MD, MD Referring MD:          Dion Body (Referring MD) Medicines:             Monitored Anesthesia Care Complications:         No immediate complications. Estimated blood loss:                         Minimal. Procedure:             Pre-Anesthesia Assessment:                        - Prior to the procedure, a History and Physical was                         performed, and patient medications and allergies were                         reviewed. The patient is competent. The risks and                         benefits of the procedure and the sedation options and                         risks were discussed with the patient. All questions                         were answered and informed consent was obtained.                         Patient identification and proposed procedure were                         verified by the physician, the nurse, the                         anesthesiologist, the anesthetist and the technician                         in the endoscopy suite. Mental Status Examination:                         alert and oriented. Airway Examination: normal                         oropharyngeal airway and neck mobility. Respiratory                         Examination: clear to auscultation. CV Examination:  normal. Prophylactic Antibiotics: The patient does not                         require prophylactic antibiotics. Prior                         Anticoagulants: The patient has taken no anticoagulant                          or antiplatelet agents. ASA Grade Assessment: III - A                         patient with severe systemic disease. After reviewing                         the risks and benefits, the patient was deemed in                         satisfactory condition to undergo the procedure. The                         anesthesia plan was to use monitored anesthesia care                         (MAC). Immediately prior to administration of                         medications, the patient was re-assessed for adequacy                         to receive sedatives. The heart rate, respiratory                         rate, oxygen saturations, blood pressure, adequacy of                         pulmonary ventilation, and response to care were                         monitored throughout the procedure. The physical                         status of the patient was re-assessed after the                         procedure.                        After obtaining informed consent, the colonoscope was                         passed under direct vision. Throughout the procedure,                         the patient's blood pressure, pulse, and oxygen                         saturations were monitored continuously. The  Colonoscope was introduced through the anus and                         advanced to the the cecum, identified by appendiceal                         orifice and ileocecal valve. The colonoscopy was                         performed without difficulty. The patient tolerated                         the procedure well. The quality of the bowel                         preparation was good. The ileocecal valve, appendiceal                         orifice, and rectum were photographed. Findings:      The perianal and digital rectal examinations were normal.      Many large-mouthed and small-mouthed diverticula were found in the       sigmoid colon, descending colon,  splenic flexure, transverse colon,       hepatic flexure and ascending colon.      A 1 mm polyp was found in the transverse colon. The polyp was sessile.       The polyp was removed with a jumbo cold forceps. Resection and retrieval       were complete. Estimated blood loss was minimal.      Internal hemorrhoids were found during retroflexion. The hemorrhoids       were Grade II (internal hemorrhoids that prolapse but reduce       spontaneously).      The exam was otherwise without abnormality on direct and retroflexion       views. Impression:            - Diverticulosis in the sigmoid colon, in the                         descending colon, at the splenic flexure, in the                         transverse colon, at the hepatic flexure and in the                         ascending colon.                        - One 1 mm polyp in the transverse colon, removed with                         a jumbo cold forceps. Resected and retrieved.                        - Internal hemorrhoids.                        - The examination was otherwise normal on direct and  retroflexion views. Recommendation:        - Discharge patient to home.                        - Resume previous diet.                        - Continue present medications.                        - Await pathology results.                        - Repeat colonoscopy is not recommended due to current                         age (86 years or older) for surveillance.                        - Return to referring physician as previously                         scheduled. Procedure Code(s):     --- Professional ---                        [phone], Colonoscopy, flexible; with biopsy, single or                         multiple Diagnosis Code(s):     --- Professional ---                        Z12.11, Encounter for screening for malignant neoplasm                         of colon                        K64.1, Second degree  hemorrhoids                        D12.3, Benign neoplasm of transverse colon (hepatic                         flexure or splenic flexure)                        K57.30, Diverticulosis of large intestine without                         perforation or abscess without bleeding CPT copyright 2022 American [employer] Association. All rights reserved. The codes documented in this report are preliminary and upon coder review may  be revised to meet current compliance requirements. Andrey Farmer MD, MD 09/10/2022 1:27:09 PM Number of Addenda: 0 Note Initiated On: 09/10/2022 12:52 PM Scope Withdrawal Time: 0 hours 7 minutes 48 seconds  Total Procedure Duration: 0 hours 15 minutes 38 seconds  Estimated Blood Loss:  Estimated blood loss was minimal.      [facility]

## 2022-09-10 NOTE — Anesthesia Preprocedure Evaluation (Addendum)
Anesthesia Evaluation  Patient identified by MRN, date of birth, ID band Patient awake    Reviewed: Allergy & Precautions, NPO status , Patient's Chart, lab work & pertinent test results  History of Anesthesia Complications Negative for: history of anesthetic complications  Airway Mallampati: II   Neck ROM: Full    Dental  (+) Upper Dentures, Lower Dentures   Pulmonary neg pulmonary ROS   Pulmonary exam normal breath sounds clear to auscultation       Cardiovascular hypertension, + CAD and + Peripheral Vascular Disease  Normal cardiovascular exam Rhythm:Regular Rate:Normal  ECG 08/28/22:  Normal sinus rhythm T wave abnormality, consider inferior ischemia   Neuro/Psych  PSYCHIATRIC DISORDERS Anxiety     TIA   GI/Hepatic hiatal hernia,GERD  ,,  Endo/Other  negative endocrine ROS    Renal/GU negative Renal ROS   BPH    Musculoskeletal   Abdominal   Peds  Hematology  (+) Blood dyscrasia, anemia   Anesthesia Other Findings Cardiology note 03/26/22:  72 y.o. male with  Encounter Diagnoses  Name Primary?  Coronary artery disease involving native coronary artery of native heart without angina pectoris Yes  Bradycardia  Benign essential HTN  Pure hypercholesterolemia (LDL 58 - 09/14/21)   Plan  -The patient will continue to closely monitor for symptoms of cardiovascular disease including shortness of breath, chest discomfort, giving out, having palpitations, and/or significant progression in weakness and/or fatigue. They understand that these symptoms could be a clue to coronary artery disease progression and to report these symptoms as soon as possible. The patient understands to continue current medical regimen for further risk reduction and progression of symptoms and/or cardiovascular disease.  -No further cardiac intervention at this time for bradycardia which appears not to be causing any significant issues and or  concerning signs or symptoms today. The patient will watch very closely for evidence of dizziness,syncope, weakness, fatigue, or any other symptoms or signs of worsening bradycardia for which other treatment options may be discussed. -There has been a discussion of the current guidelines for hypertension control. We will continue current medical regimen for hypertension control which will also help in risk factor modification of cardiovascular disease. The patient understands and agrees with the current plan. We will be watching for possible future side effects of these medications. Additional home blood pressure monitoring is recommended if able. -The patient understands the risks and benefits of medication management for cardiovascular risk factors including lipid management. We plan on continuing to reduce cardiovascular risk and cardiovascular disease process by lowering LDL between 30% and 50% if achievable. We have discussed other lifestyle measures to help with this risk reduction as well. - continue anti-platelet medication management for cerebrovascular disease at this time. Patient understands risk and benefits of this medication management which includes reducing vascular morbidity and mortality in the future. They understand the risks of this treatment including bleeding and possible bruising as well. Other cardiovascular risk management will also be continued including blood pressure control, lipid treatment, and lifestyle changes.  No orders of the defined types were placed in this encounter.  Return in about 1 year (around 03/27/2023).    Reproductive/Obstetrics                             Anesthesia Physical Anesthesia Plan  ASA: 3  Anesthesia Plan: General   Post-op Pain Management:    Induction: Intravenous  PONV Risk Score and Plan: 2 and Propofol infusion,  TIVA and Treatment may vary due to age or medical condition  Airway Management Planned: Natural  Airway  Additional Equipment:   Intra-op Plan:   Post-operative Plan:   Informed Consent: I have reviewed the patients History and Physical, chart, labs and discussed the procedure including the risks, benefits and alternatives for the proposed anesthesia with the patient or authorized representative who has indicated his/her understanding and acceptance.       Plan Discussed with: CRNA  Anesthesia Plan Comments: (LMA/GETA backup discussed.  Patient consented for risks of anesthesia including but not limited to:  - adverse reactions to medications - damage to eyes, teeth, lips or other oral mucosa - nerve damage due to positioning  - sore throat or hoarseness - damage to heart, brain, nerves, lungs, other parts of body or loss of life  Informed patient about role of CRNA in peri- and intra-operative care.  Patient voiced understanding.)        Anesthesia Quick Evaluation

## 2022-09-11 ENCOUNTER — Encounter: Payer: Self-pay | Admitting: Gastroenterology

## 2022-09-11 LAB — SURGICAL PATHOLOGY

## 2022-09-11 NOTE — Anesthesia Postprocedure Evaluation (Signed)
Anesthesia Post Note  Patient: Cory Hoover  Procedure(s) Performed: COLONOSCOPY WITH PROPOFOL  Patient location during evaluation: PACU Anesthesia Type: General Level of consciousness: awake and alert, oriented and patient cooperative Pain management: pain level controlled Vital Signs Assessment: post-procedure vital signs reviewed and stable Respiratory status: spontaneous breathing, nonlabored ventilation and respiratory function stable Cardiovascular status: blood pressure returned to baseline and stable Postop Assessment: adequate PO intake Anesthetic complications: no   No notable events documented.   Last Vitals:  Vitals:   09/10/22 1344 09/10/22 1354  BP: (!) 147/82   Pulse:  62  Resp:    Temp:    SpO2:  100%    Last Pain:  Vitals:   09/10/22 1354  TempSrc:   PainSc: 0-No pain                 Darrin Nipper

## 2022-09-18 ENCOUNTER — Ambulatory Visit: Payer: Medicare Other | Attending: Neurology

## 2022-09-18 DIAGNOSIS — R2689 Other abnormalities of gait and mobility: Secondary | ICD-10-CM | POA: Diagnosis present

## 2022-09-18 DIAGNOSIS — R2681 Unsteadiness on feet: Secondary | ICD-10-CM | POA: Insufficient documentation

## 2022-09-18 DIAGNOSIS — M6281 Muscle weakness (generalized): Secondary | ICD-10-CM | POA: Diagnosis present

## 2022-09-18 NOTE — Therapy (Signed)
OUTPATIENT PHYSICAL THERAPY NEURO EVALUATION   Patient Name: Cory Hoover MRN: [id number] DOB:1950-09-08, 72 y.o., male Today's Date: 09/19/2022   PCP: Dr. Netty Starring, MD REFERRING PROVIDER: Dr. Manuella Ghazi, MD  END OF SESSION:  PT End of Session - 09/19/22 1802     Visit Number 1    Number of Visits 17    Date for PT Re-Evaluation 11/13/22    Authorization Type Medicare    Authorization - Visit Number 1    Authorization - Number of Visits 10    PT Start Time 1200    PT Stop Time [id number]    PT Time Calculation (min) 45 min    Activity Tolerance Patient tolerated treatment well    Behavior During Therapy WFL for tasks assessed/performed             Past Medical History:  Diagnosis Date   Anxiety    Aortic atherosclerosis ([place])    BPH (benign prostatic hyperplasia)    Coronary artery disease    Current use of long term anticoagulation    Clopidogrel   Diverticulosis    ED (erectile dysfunction)    GERD (gastroesophageal reflux disease)    History of hiatal hernia    History of impacted cerumen    HLD (hyperlipidemia)    HTN (hypertension)    PVD (peripheral vascular disease) ([place])    Past Surgical History:  Procedure Laterality Date   COLONOSCOPY WITH PROPOFOL N/A 09/10/2022   Procedure: COLONOSCOPY WITH PROPOFOL;  Surgeon: Lesly Rubenstein, MD;  Location: [facility] ENDOSCOPY;  Service: Endoscopy;  Laterality: N/A;   ESOPHAGEAL MANOMETRY N/A 12/27/2020   Procedure: ESOPHAGEAL MANOMETRY (EM);  Surgeon: Mauri Pole, MD;  Location: [facility] ENDOSCOPY;  Service: Endoscopy;  Laterality: N/A;   ESOPHAGOGASTRODUODENOSCOPY (EGD) WITH PROPOFOL N/A 01/18/2021   Procedure: ESOPHAGOGASTRODUODENOSCOPY (EGD) WITH PROPOFOL;  Surgeon: Lucilla Lame, MD;  Location: [facility] ENDOSCOPY;  Service: Endoscopy;  Laterality: N/A;   HEEL SPUR SURGERY Left    [place] IMPEDANCE STUDY N/A 12/27/2020   Procedure: [place] IMPEDANCE STUDY;  Surgeon: Mauri Pole, MD;  Location: [facility] ENDOSCOPY;  Service:  Endoscopy;  Laterality: N/A;   VASECTOMY     XI ROBOTIC ASSISTED HIATAL HERNIA REPAIR N/A 03/01/2021   Procedure: XI ROBOTIC ASSISTED HIATAL HERNIA REPAIR WITH MESH RNFA to assist;  Surgeon: Jules Husbands, MD;  Location: [facility] ORS;  Service: General;  Laterality: N/A;   Patient Active Problem List   Diagnosis Date Noted   Generalized weakness 03/03/2021   Hypermagnesemia 03/03/2021   Hallucinations 03/03/2021   Prerenal azotemia 03/03/2021   Transaminitis 03/03/2021   HLD (hyperlipidemia)    HTN (hypertension)    Frequent falls 03/02/2021   S/P repair of paraesophageal hernia 03/01/2021   Cough    Heartburn    Gastritis without bleeding    Gastroesophageal reflux disease 12/06/2020   History of TIA (transient ischemic attack) 06/19/2020   Acute focal neurological deficit 06/10/2020   BPH (benign prostatic hyperplasia) 06/10/2020   Abnormal ECG 12/08/2019   Benign prostatic hyperplasia with nocturia 10/19/2019   Coronary artery disease 10/19/2019   Gastroesophageal reflux disease with esophagitis 10/19/2019   Encounter for general adult medical examination without abnormal findings 04/10/2017   History of normocytic normochromic anemia 04/10/2017   Vaccine counseling 03/07/2016   Pure hypercholesterolemia 05/22/2015   Bradycardia 09/22/2014    ONSET DATE: within the last month  REFERRING DIAG: G31.83 (Lewy body dementia), balance/gait abnormalities  THERAPY DIAG:  Unsteadiness on feet  Other abnormalities of  gait and mobility  Muscle weakness (generalized)  Balance problem  Rationale for Evaluation and Treatment: Rehabilitation  SUBJECTIVE:                                                                                                                                                                                             SUBJECTIVE STATEMENT: Pt states 3 weeks ago he was in the ER because he had trouble walking. He saw Dr. Manuella Ghazi in the past 2 weeks and was  diagnosed with Lewy Body Dementia.  He reports having difficulty with his balance for a few months.  He gradually has noticed that he has difficulty with foot placement while walking on uneven surfaces (trails); he has fallen twice in the past 3 months- while walking with friends on a trail and while playing recreational tennis on the tennis court.  Overall he feels like his L side is not as strong and coordinated as his right.  He also notes difficult with cognition, for example if he has to make a decision between 1 thing or another it takes him awhile to make a choice.  He is having some slight difficulty with word recall.  Also he expresses an increase in anxiety since receiving this diagnosis and has started medication to address this.  He does express fear and apprehension with his health.  His goal is to be able to maintain his strength, mobility, and independence as long as possible as the disease progresses.    PERTINENT HISTORY: Pt is retired since 2002 (used to work for [employer]).  Lives at home with his wife.  Has a multistory home which he plans to stay in forever; he and his wife had it built in the 15s.  He is very physically active- playing tennis 2x/wk, walking 1x/week, and doing machines  (bike, row,TM) at the gym 1-2x/week.  He regularly socializes with a group of friends that meet for lunch weekly.    PAIN:  Are you having pain? No  PRECAUTIONS: Fall  WEIGHT BEARING RESTRICTIONS: No  FALLS: Has patient fallen in last 6 months? Yes. Number of falls 2  LIVING ENVIRONMENT: Lives with: lives with their spouse Lives in: House/apartment Stairs: Yes: Internal: 1 flight steps; yes Has following equipment at home: None Pt states he is going to be having an elevator installed bc his bedroom is upstairs and media room is upstairs PLOF: Independent  PATIENT GOALS: to maintain his physical independence, mobility, and strength as long as possible  OBJECTIVE:    COGNITION: Overall  cognitive status: Within functional limits for tasks assessed   SENSATION: In tact to light  touch b/l LE  COORDINATION: Impaired with finger to nose and heel on shin movements L>R  POSTURE: rounded shoulders  LOWER EXTREMITY ROM:     Active  Right Eval Left Eval  Hip flexion    Hip extension    Hip abduction    Hip adduction    Hip internal rotation    Hip external rotation    Knee flexion    Knee extension    Ankle dorsiflexion    Ankle plantarflexion    Ankle inversion    Ankle eversion     (Blank rows = not tested)  LOWER EXTREMITY MMT:    MMT Right Eval Left Eval  Hip flexion 5 4  Hip extension    Hip abduction    Hip adduction    Hip internal rotation    Hip external rotation    Knee flexion 5 4  Knee extension 5 4  Ankle dorsiflexion 5 4  Ankle plantarflexion    Ankle inversion    Ankle eversion    (Blank rows = not tested)  BED MOBILITY:  Not assessed today  TRANSFERS: Assistive device utilized: None  Sit to stand: Complete Independence Stand to sit: Complete Independence Chair to chair: Complete Independence Floor:  not assessed today, deferred   GAIT: Gait pattern: decreased trunk rotation, trunk flexed, and wide BOS Distance walked: into/out of clinic independently Assistive device utilized: None Level of assistance: Complete Independence Comments: pt requires additional steps while turning 360 deg  FUNCTIONAL TESTS:  5 times sit to stand: 16 seconds 12.6 sec is age norm (Fall risk) PATIENT SURVEYS:  BERG Balance Test: 41/56 (Fall risk)  TODAY'S TREATMENT:                                                                                                                              DATE: 09/18/22    PATIENT EDUCATION: Education details: PT POC, why PT is helpful and the importance of working on balance/gait/strength/activity tolerance Person educated: Patient Education method: Explanation Education comprehension: verbalized  understanding  HOME EXERCISE PROGRAM: Deferred until second visit  GOALS: Goals reviewed with patient? Yes  SHORT TERM GOALS: Target date: 10/17/22   Pt will be able to perform a HEP for balance/strengthening exercises independently Baseline: will establish at visit 2 Goal status: INITIAL  2.  Pt will be able to ascend/descend 1 flight of stairs with reciprocal gait pattern 2x without loss of balance to promote independently going upstairs to his bedroom  Baseline: will assess at visit 2 Goal status: INITIAL    LONG TERM GOALS: Target date: 11/13/22  Improve BERG to >46 indicating reduced risk for falls Baseline: 41 Goal status: INITIAL  2.  Improve 5x STS to <12 seconds indicating reduced risk for fall Baseline: 16 sec Goal status: INITIAL  3.  Improve SLS to >18 sec (age norm average) R and L on even and uneven surfaces to promote independent amb on flat and uneven  outdoor surfaces without AD Baseline: 9-15 sec today R/L Goal status: INITIAL    ASSESSMENT:  CLINICAL IMPRESSION: Patient is a pleasant 72 y.o. M who was seen today for physical therapy evaluation and treatment for impaired balance/gait associated with Lewy Body Dementia.  He was very motivated to participate in PT today and is a good candidate for a course of skilled PT interventions to address these impairments:   OBJECTIVE IMPAIRMENTS: Abnormal gait, decreased balance, decreased cognition, decreased strength, and decreased safety awareness.   ACTIVITY LIMITATIONS: standing, squatting, stairs, and locomotion level  PARTICIPATION LIMITATIONS: driving, community activity, and yard work  PERSONAL FACTORS: Age and 1-2 comorbidities: anxiety, lewy body dementia  are also affecting patient's functional outcome.   REHAB POTENTIAL: Good  CLINICAL DECISION MAKING: Stable/uncomplicated  EVALUATION COMPLEXITY: Low  PLAN:  PT FREQUENCY: 1-2x/week  PT DURATION: 8 weeks  PLANNED INTERVENTIONS: Therapeutic  exercises, Therapeutic activity, Neuromuscular re-education, Balance training, Gait training, Patient/Family education, Self Care, and Joint mobilization  PLAN FOR NEXT SESSION: balance/strength/gait retraining activities; dual tasking activities; focus on higher level balance activities   Merdis Delay, PT, DPT, OCS  [id number]  Pincus Badder, PT 09/19/2022, 6:56 PM

## 2022-09-23 ENCOUNTER — Ambulatory Visit: Payer: Medicare Other

## 2022-09-23 DIAGNOSIS — R2681 Unsteadiness on feet: Secondary | ICD-10-CM

## 2022-09-23 DIAGNOSIS — M6281 Muscle weakness (generalized): Secondary | ICD-10-CM

## 2022-09-23 DIAGNOSIS — R2689 Other abnormalities of gait and mobility: Secondary | ICD-10-CM

## 2022-09-23 NOTE — Therapy (Signed)
OUTPATIENT PHYSICAL THERAPY NEURO EVALUATION   Patient Name: Cory Hoover MRN: [id number] DOB:03/04/1951, 72 y.o., male Today's Date: 09/23/2022   PCP: Dr. Netty Starring, MD REFERRING PROVIDER: Dr. Manuella Ghazi, MD  END OF SESSION:  PT End of Session - 09/23/22 1224     Visit Number 2    Number of Visits 17    Date for PT Re-Evaluation 11/13/22    Authorization Type Medicare    Authorization - Visit Number 2    Authorization - Number of Visits 10    PT Start Time 1205    PT Stop Time 1250    PT Time Calculation (min) 45 min    Activity Tolerance Patient tolerated treatment well    Behavior During Therapy WFL for tasks assessed/performed             Past Medical History:  Diagnosis Date   Anxiety    Aortic atherosclerosis ([place])    BPH (benign prostatic hyperplasia)    Coronary artery disease    Current use of long term anticoagulation    Clopidogrel   Diverticulosis    ED (erectile dysfunction)    GERD (gastroesophageal reflux disease)    History of hiatal hernia    History of impacted cerumen    HLD (hyperlipidemia)    HTN (hypertension)    PVD (peripheral vascular disease) ([place])    Past Surgical History:  Procedure Laterality Date   COLONOSCOPY WITH PROPOFOL N/A 09/10/2022   Procedure: COLONOSCOPY WITH PROPOFOL;  Surgeon: Lesly Rubenstein, MD;  Location: [facility] ENDOSCOPY;  Service: Endoscopy;  Laterality: N/A;   ESOPHAGEAL MANOMETRY N/A 12/27/2020   Procedure: ESOPHAGEAL MANOMETRY (EM);  Surgeon: Mauri Pole, MD;  Location: [facility] ENDOSCOPY;  Service: Endoscopy;  Laterality: N/A;   ESOPHAGOGASTRODUODENOSCOPY (EGD) WITH PROPOFOL N/A 01/18/2021   Procedure: ESOPHAGOGASTRODUODENOSCOPY (EGD) WITH PROPOFOL;  Surgeon: Lucilla Lame, MD;  Location: [facility] ENDOSCOPY;  Service: Endoscopy;  Laterality: N/A;   HEEL SPUR SURGERY Left    [place] IMPEDANCE STUDY N/A 12/27/2020   Procedure: [place] IMPEDANCE STUDY;  Surgeon: Mauri Pole, MD;  Location: [facility] ENDOSCOPY;  Service:  Endoscopy;  Laterality: N/A;   VASECTOMY     XI ROBOTIC ASSISTED HIATAL HERNIA REPAIR N/A 03/01/2021   Procedure: XI ROBOTIC ASSISTED HIATAL HERNIA REPAIR WITH MESH RNFA to assist;  Surgeon: Jules Husbands, MD;  Location: [facility] ORS;  Service: General;  Laterality: N/A;   Patient Active Problem List   Diagnosis Date Noted   Generalized weakness 03/03/2021   Hypermagnesemia 03/03/2021   Hallucinations 03/03/2021   Prerenal azotemia 03/03/2021   Transaminitis 03/03/2021   HLD (hyperlipidemia)    HTN (hypertension)    Frequent falls 03/02/2021   S/P repair of paraesophageal hernia 03/01/2021   Cough    Heartburn    Gastritis without bleeding    Gastroesophageal reflux disease 12/06/2020   History of TIA (transient ischemic attack) 06/19/2020   Acute focal neurological deficit 06/10/2020   BPH (benign prostatic hyperplasia) 06/10/2020   Abnormal ECG 12/08/2019   Benign prostatic hyperplasia with nocturia 10/19/2019   Coronary artery disease 10/19/2019   Gastroesophageal reflux disease with esophagitis 10/19/2019   Encounter for general adult medical examination without abnormal findings 04/10/2017   History of normocytic normochromic anemia 04/10/2017   Vaccine counseling 03/07/2016   Pure hypercholesterolemia 05/22/2015   Bradycardia 09/22/2014    ONSET DATE: within the last month  REFERRING DIAG: G31.83 (Lewy body dementia), balance/gait abnormalities  THERAPY DIAG:  Unsteadiness on feet  Other abnormalities of  gait and mobility  Muscle weakness (generalized)  Balance problem  Rationale for Evaluation and Treatment: Rehabilitation  From initial evaluation note: SUBJECTIVE:                                                                                                                                                                                             SUBJECTIVE STATEMENT: Pt states 3 weeks ago he was in the ER because he had trouble walking. He saw Dr. Manuella Ghazi in the  past 2 weeks and was diagnosed with Lewy Body Dementia.  He reports having difficulty with his balance for a few months.  He gradually has noticed that he has difficulty with foot placement while walking on uneven surfaces (trails); he has fallen twice in the past 3 months- while walking with friends on a trail and while playing recreational tennis on the tennis court.  Overall he feels like his L side is not as strong and coordinated as his right.  He also notes difficult with cognition, for example if he has to make a decision between 1 thing or another it takes him awhile to make a choice.  He is having some slight difficulty with word recall.  Also he expresses an increase in anxiety since receiving this diagnosis and has started medication to address this.  He does express fear and apprehension with his health.  His goal is to be able to maintain his strength, mobility, and independence as long as possible as the disease progresses.    PERTINENT HISTORY: Pt is retired since 2002 (used to work for [employer]).  Lives at home with his wife.  Has a multistory home which he plans to stay in forever; he and his wife had it built in the 30s.  He is very physically active- playing tennis 2x/wk, walking 1x/week, and doing machines  (bike, row,TM) at the gym 1-2x/week.  He regularly socializes with a group of friends that meet for lunch weekly.    PAIN:  Are you having pain? No  PRECAUTIONS: Fall  WEIGHT BEARING RESTRICTIONS: No  FALLS: Has patient fallen in last 6 months? Yes. Number of falls 2  LIVING ENVIRONMENT: Lives with: lives with their spouse Lives in: House/apartment Stairs: Yes: Internal: 1 flight steps; yes Has following equipment at home: None Pt states he is going to be having an elevator installed bc his bedroom is upstairs and media room is upstairs PLOF: Independent  PATIENT GOALS: to maintain his physical independence, mobility, and strength as long as possible  OBJECTIVE:     COGNITION: Overall cognitive status: Within functional limits for tasks assessed   SENSATION:  In tact to light touch b/l LE  COORDINATION: Impaired with finger to nose and heel on shin movements L>R  POSTURE: rounded shoulders  LOWER EXTREMITY ROM:     Active  Right Eval Left Eval  Hip flexion    Hip extension    Hip abduction    Hip adduction    Hip internal rotation    Hip external rotation    Knee flexion    Knee extension    Ankle dorsiflexion    Ankle plantarflexion    Ankle inversion    Ankle eversion     (Blank rows = not tested)  LOWER EXTREMITY MMT:    MMT Right Eval Left Eval  Hip flexion 5 4  Hip extension    Hip abduction    Hip adduction    Hip internal rotation    Hip external rotation    Knee flexion 5 4  Knee extension 5 4  Ankle dorsiflexion 5 4  Ankle plantarflexion    Ankle inversion    Ankle eversion    (Blank rows = not tested)  BED MOBILITY:  Not assessed today  TRANSFERS: Assistive device utilized: None  Sit to stand: Complete Independence Stand to sit: Complete Independence Chair to chair: Complete Independence Floor:  not assessed today, deferred   GAIT: Gait pattern: decreased trunk rotation, trunk flexed, and wide BOS Distance walked: into/out of clinic independently Assistive device utilized: None Level of assistance: Complete Independence Comments: pt requires additional steps while turning 360 deg  FUNCTIONAL TESTS:  5 times sit to stand: 16 seconds 12.6 sec is age norm (Fall risk) PATIENT SURVEYS:  BERG Balance Test: 41/56 (Fall risk)  TODAY'S TREATMENT:                                                                                                                              DATE: 08/2622  SUBJECTIVE: Pt reports he has not fallen since last week.  He feels like he has difficulty navigating stairs early in the day- has to go slowly.  He expresses fear/anxiety about the future with his  health.  Therapeutic Exercise: Squat/lift movement: 9 lb weight from 8 inch step ("sumo squat") 2x15 Goblet squat: 9 lb weight (mat table behind him) 2x12 Hamstring curl: 2x15 5# ankle weights Heel raises: 2x15 5# ankle weights Standing hip abd: with emphasis on upright trunk posture 2x10 R and L 5# ankle weights Scifit Level 6 x 10 min for LE strength/endurance HEP instruction/pt education (see below)  PATIENT EDUCATION: Education details: PT POC, HEP instruction, discussed how to use his home gym equipment to recreate the exercises we did during today's sessio; pt education for why LE strengthening is important for overall functional movements (gait, squatting to pick something up, stair navigation) and balance.  Also discussed why adding a psychologist to his care team could be helpful to address some of the anxiety/fear of the future that he expressed during today's session  Person educated: Patient Education method: Explanation Education comprehension: verbalized understanding  HOME EXERCISE PROGRAM: Access Code: [email or web address] URL: [email or web address] Date: 09/23/2022 Prepared by: Merdis Delay  Exercises - Goblet Squat with Kettlebell  - 1 x daily - 3 x weekly - 2 sets - 20 reps - Standing Heel Raise with Chair Support  - 1 x daily - 3 x weekly - 2 sets - 20 reps - Standing Hamstring Curl with Chair Support  - 1 x daily - 3 x weekly - 2 sets - 20 reps - Seated Long Arc Quad with Ankle Weight  - 1 x daily - 3 x weekly - 2 sets - 20 reps - Standing Hip Abduction with Counter Support  - 1 x daily - 3 x weekly - 2 sets - 20 reps - Standing Hip Adduction with Counter Support  - 1 x daily - 3 x weekly - 2 sets - 20 reps  GOALS: Goals reviewed with patient? Yes  SHORT TERM GOALS: Target date: 10/17/22   Pt will be able to perform a HEP for balance/strengthening exercises independently Baseline: will establish at visit 2 Goal status: INITIAL  2.  Pt will be able to  ascend/descend 1 flight of stairs with reciprocal gait pattern 2x without loss of balance to promote independently going upstairs to his bedroom  Baseline: will assess at visit 2 Goal status: INITIAL    LONG TERM GOALS: Target date: 11/13/22  Improve BERG to >46 indicating reduced risk for falls Baseline: 41 Goal status: INITIAL  2.  Improve 5x STS to <12 seconds indicating reduced risk for fall Baseline: 16 sec Goal status: INITIAL  3.  Improve SLS to >18 sec (age norm average) R and L on even and uneven surfaces to promote independent amb on flat and uneven outdoor surfaces without AD Baseline: 9-15 sec today R/L Goal status: INITIAL    ASSESSMENT:  CLINICAL IMPRESSION: Initiated LE strengthening exercises during today's session to address strength deficits.  Pt has a difficult time performing hip hinge motion during sumo squat; able to improve hip hinge strategy with goblet squat allowing for improved quad and gluteal mm activation.  He does require tactile/verbal cues throughout session for upright posture during standing exercises.  Overall, created a HEP today which he can perform with his home equipment to focus on strength.  Plan to begin adding balance exercises at next session.    OBJECTIVE IMPAIRMENTS: Abnormal gait, decreased balance, decreased cognition, decreased strength, and decreased safety awareness.   ACTIVITY LIMITATIONS: standing, squatting, stairs, and locomotion level  PARTICIPATION LIMITATIONS: driving, community activity, and yard work  PERSONAL FACTORS: Age and 1-2 comorbidities: anxiety, lewy body dementia  are also affecting patient's functional outcome.   REHAB POTENTIAL: Good  CLINICAL DECISION MAKING: Stable/uncomplicated  EVALUATION COMPLEXITY: Low  PLAN:  PT FREQUENCY: 1-2x/week  PT DURATION: 8 weeks  PLANNED INTERVENTIONS: Therapeutic exercises, Therapeutic activity, Neuromuscular re-education, Balance training, Gait training,  Patient/Family education, Self Care, and Joint mobilization  PLAN FOR NEXT SESSION: Continue LE strengthening; add balance and gait retraining activities; dual tasking activities; focus on higher level balance activities   Merdis Delay, PT, DPT, OCS  [id number]  Pincus Badder, PT 09/23/2022, 4:01 PM

## 2022-09-30 ENCOUNTER — Ambulatory Visit: Payer: Medicare Other | Attending: Neurology

## 2022-09-30 DIAGNOSIS — M6281 Muscle weakness (generalized): Secondary | ICD-10-CM

## 2022-09-30 DIAGNOSIS — R2689 Other abnormalities of gait and mobility: Secondary | ICD-10-CM | POA: Insufficient documentation

## 2022-09-30 DIAGNOSIS — R2681 Unsteadiness on feet: Secondary | ICD-10-CM | POA: Diagnosis not present

## 2022-09-30 NOTE — Therapy (Signed)
OUTPATIENT PHYSICAL THERAPY NEURO TREATMENT   Patient Name: Cory Hoover MRN: [id number] DOB:[birth year], 72 y.o., male Today's Date: 09/30/2022   PCP: Dr. Netty Starring, MD REFERRING PROVIDER: Dr. Manuella Ghazi, MD  END OF SESSION:  PT End of Session - 09/30/22 1204     Visit Number 3    Number of Visits 17    Date for PT Re-Evaluation 11/13/22    Authorization Type Medicare    Authorization - Number of Visits 10    PT Start Time 1205    PT Stop Time 1245    PT Time Calculation (min) 40 min    Activity Tolerance Patient tolerated treatment well    Behavior During Therapy WFL for tasks assessed/performed             Past Medical History:  Diagnosis Date   Anxiety    Aortic atherosclerosis ([place])    BPH (benign prostatic hyperplasia)    Coronary artery disease    Current use of long term anticoagulation    Clopidogrel   Diverticulosis    ED (erectile dysfunction)    GERD (gastroesophageal reflux disease)    History of hiatal hernia    History of impacted cerumen    HLD (hyperlipidemia)    HTN (hypertension)    PVD (peripheral vascular disease) ([place])    Past Surgical History:  Procedure Laterality Date   COLONOSCOPY WITH PROPOFOL N/A 09/10/2022   Procedure: COLONOSCOPY WITH PROPOFOL;  Surgeon: Lesly Rubenstein, MD;  Location: [facility] ENDOSCOPY;  Service: Endoscopy;  Laterality: N/A;   ESOPHAGEAL MANOMETRY N/A 12/27/2020   Procedure: ESOPHAGEAL MANOMETRY (EM);  Surgeon: Mauri Pole, MD;  Location: [facility] ENDOSCOPY;  Service: Endoscopy;  Laterality: N/A;   ESOPHAGOGASTRODUODENOSCOPY (EGD) WITH PROPOFOL N/A 01/18/2021   Procedure: ESOPHAGOGASTRODUODENOSCOPY (EGD) WITH PROPOFOL;  Surgeon: Lucilla Lame, MD;  Location[facility] ENDOSCOPY;  Service: Endoscopy;  Laterality: N/A;   HEEL SPUR SURGERY Left    [place] IMPEDANCE STUDY N/A 12/27/2020   Procedure: [place] IMPEDANCE STUDY;  Surgeon: Mauri Pole, MD;  Location: [facility] ENDOSCOPY;  Service: Endoscopy;  Laterality: N/A;   VASECTOMY      XI ROBOTIC ASSISTED HIATAL HERNIA REPAIR N/A 03/01/2021   Procedure: XI ROBOTIC ASSISTED HIATAL HERNIA REPAIR WITH MESH RNFA to assist;  Surgeon: Jules Husbands, MD;  Location: [facility] ORS;  Service: General;  Laterality: N/A;   Patient Active Problem List   Diagnosis Date Noted   Generalized weakness 03/03/2021   Hypermagnesemia 03/03/2021   Hallucinations 03/03/2021   Prerenal azotemia 03/03/2021   Transaminitis 03/03/2021   HLD (hyperlipidemia)    HTN (hypertension)    Frequent falls 03/02/2021   S/P repair of paraesophageal hernia 03/01/2021   Cough    Heartburn    Gastritis without bleeding    Gastroesophageal reflux disease 12/06/2020   History of TIA (transient ischemic attack) 06/19/2020   Acute focal neurological deficit 06/10/2020   BPH (benign prostatic hyperplasia) 06/10/2020   Abnormal ECG 12/08/2019   Benign prostatic hyperplasia with nocturia 10/19/2019   Coronary artery disease 10/19/2019   Gastroesophageal reflux disease with esophagitis 10/19/2019   Encounter for general adult medical examination without abnormal findings 04/10/2017   History of normocytic normochromic anemia 04/10/2017   Vaccine counseling 03/07/2016   Pure hypercholesterolemia 05/22/2015   Bradycardia 09/22/2014    ONSET DATE: within the last month  REFERRING DIAG: G31.83 (Lewy body dementia), balance/gait abnormalities  THERAPY DIAG:  Unsteadiness on feet  Other abnormalities of gait and mobility  Muscle weakness (generalized)  Balance problem  Rationale for Evaluation and Treatment: Rehabilitation  From initial evaluation note: SUBJECTIVE:                                                                                                                                                                                             SUBJECTIVE STATEMENT: Pt states 3 weeks ago he was in the ER because he had trouble walking. He saw Dr. Manuella Ghazi in the past 2 weeks and was diagnosed with Lewy  Body Dementia.  He reports having difficulty with his balance for a few months.  He gradually has noticed that he has difficulty with foot placement while walking on uneven surfaces (trails); he has fallen twice in the past 3 months- while walking with friends on a trail and while playing recreational tennis on the tennis court.  Overall he feels like his L side is not as strong and coordinated as his right.  He also notes difficult with cognition, for example if he has to make a decision between 1 thing or another it takes him awhile to make a choice.  He is having some slight difficulty with word recall.  Also he expresses an increase in anxiety since receiving this diagnosis and has started medication to address this.  He does express fear and apprehension with his health.  His goal is to be able to maintain his strength, mobility, and independence as long as possible as the disease progresses.    PERTINENT HISTORY: Pt is retired since 2002 (used to work for [employer]).  Lives at home with his wife.  Has a multistory home which he plans to stay in forever; he and his wife had it built in the 74s.  He is very physically active- playing tennis 2x/wk, walking 1x/week, and doing machines  (bike, row,TM) at the gym 1-2x/week.  He regularly socializes with a group of friends that meet for lunch weekly.    PAIN:  Are you having pain? No  PRECAUTIONS: Fall  WEIGHT BEARING RESTRICTIONS: No  FALLS: Has patient fallen in last 6 months? Yes. Number of falls 2  LIVING ENVIRONMENT: Lives with: lives with their spouse Lives in: House/apartment Stairs: Yes: Internal: 1 flight steps; yes Has following equipment at home: None Pt states he is going to be having an elevator installed bc his bedroom is upstairs and media room is upstairs PLOF: Independent  PATIENT GOALS: to maintain his physical independence, mobility, and strength as long as possible  OBJECTIVE:    COGNITION: Overall cognitive status: Within  functional limits for tasks assessed   SENSATION: In tact to light touch b/l LE  COORDINATION: Impaired with finger to nose and heel on shin movements L>R  POSTURE: rounded shoulders  LOWER EXTREMITY ROM:     Active  Right Eval Left Eval  Hip flexion    Hip extension    Hip abduction    Hip adduction    Hip internal rotation    Hip external rotation    Knee flexion    Knee extension    Ankle dorsiflexion    Ankle plantarflexion    Ankle inversion    Ankle eversion     (Blank rows = not tested)  LOWER EXTREMITY MMT:    MMT Right Eval Left Eval  Hip flexion 5 4  Hip extension    Hip abduction    Hip adduction    Hip internal rotation    Hip external rotation    Knee flexion 5 4  Knee extension 5 4  Ankle dorsiflexion 5 4  Ankle plantarflexion    Ankle inversion    Ankle eversion    (Blank rows = not tested)  BED MOBILITY:  Not assessed today  TRANSFERS: Assistive device utilized: None  Sit to stand: Complete Independence Stand to sit: Complete Independence Chair to chair: Complete Independence Floor:  not assessed today, deferred   GAIT: Gait pattern: decreased trunk rotation, trunk flexed, and wide BOS Distance walked: into/out of clinic independently Assistive device utilized: None Level of assistance: Complete Independence Comments: pt requires additional steps while turning 360 deg  FUNCTIONAL TESTS:  5 times sit to stand: 16 seconds 12.6 sec is age norm (Fall risk) PATIENT SURVEYS:  BERG Balance Test: 41/56 (Fall risk)  TODAY'S TREATMENT:                                                                                                                              DATE: 09/30/22  SUBJECTIVE: Pt reports he has worked on his LE strengthening exercises at home- he used ankle weights.  He reports having difficulty with foot placement when it is "in transition" while stepping down steps or moving foot from gas to break.     Therapeutic  Exercise: Scifit Level 6 x 5 min for LE strength/endurance  Neuro Re-education: With gait belt and PT SBA In parallel bars: Tandem walk on airex 4 laps Lateral walk on airex 4 laps Standing on airex feet together, upright trunk: 30 seconds x 3 Airex feet together with arms reach up/down x 10 ea Airex feet together with thoracic/lumbar/pelvic rotation reaching for ball and passing off to PT R and L x10 ea Airex feet together diagonal reach/pass from low to high R and L with PT x10 ea Alternating foot placement coordination on 6 inch step (b/l hands resting on railing) 1 min interval x 2, repeated standing on airex 1 min x 2 Obstacle course: step over hurdles, onto/over airex, onto/over step: focusing on visual scan of obstacles but not looking down the whole time during activity: 4 laps in hallway  Not today: Squat/lift movement: 9 lb weight from 8 inch step ("sumo squat") 2x15 Goblet squat: 9 lb weight (mat table behind him) 2x12 Hamstring curl: 2x15 5# ankle weights Heel raises: 2x15 5# ankle weights Standing hip abd: with emphasis on upright trunk posture 2x10 R and L 5# ankle weights  HEP instruction/pt education (see below)  PATIENT EDUCATION: Education details: PT POC, HEP instruction/progression  Person educated: Patient Education method: Explanation Education comprehension: verbalized understanding  HOME EXERCISE PROGRAM: Access Code: [email or web address] URL: [email or web address] Date: 09/30/2022 Prepared by: Merdis Delay  Exercises - Goblet Squat with Kettlebell  - 1 x daily - 3 x weekly - 2 sets - 20 reps - Standing Heel Raise with Chair Support  - 1 x daily - 3 x weekly - 2 sets - 20 reps - Standing Hamstring Curl with Chair Support  - 1 x daily - 3 x weekly - 2 sets - 20 reps - Seated Long Arc Quad with Ankle Weight  - 1 x daily - 3 x weekly - 2 sets - 20 reps - Standing Hip Abduction with Counter Support  - 1 x daily - 3 x weekly - 2 sets - 20 reps -  Standing Hip Adduction with Counter Support  - 1 x daily - 3 x weekly - 2 sets - 20 reps - Step Taps on High Step  - 1 x daily - 3 x weekly - 3 sets - 10 reps - Standing with Feet Together on Foam Pad (BKA)  - 1 x daily - 3 x weekly - 30 sec hold  GOALS: Goals reviewed with patient? Yes  SHORT TERM GOALS: Target date: 10/17/22   Pt will be able to perform a HEP for balance/strengthening exercises independently Baseline: will establish at visit 2 Goal status: INITIAL  2.  Pt will be able to ascend/descend 1 flight of stairs with reciprocal gait pattern 2x without loss of balance to promote independently going upstairs to his bedroom  Baseline: will assess at visit 2 Goal status: INITIAL    LONG TERM GOALS: Target date: 11/13/22  Improve BERG to >46 indicating reduced risk for falls Baseline: 41 Goal status: INITIAL  2.  Improve 5x STS to <12 seconds indicating reduced risk for fall Baseline: 16 sec Goal status: INITIAL  3.  Improve SLS to >18 sec (age norm average) R and L on even and uneven surfaces to promote independent amb on flat and uneven outdoor surfaces without AD Baseline: 9-15 sec today R/L Goal status: INITIAL    ASSESSMENT:  CLINICAL IMPRESSION: Pt was most challenged with balance activities on uneven surface that required dual tasking today.  He relies heavily on visual cues of feet and terrain for balance. Able to perform activities without looking at feet and accuracy of foot placement decreases.  Overall he tolerated today's session well and should benefit from incorporating balance activities into his HEP to promote improved safety during amb.  OBJECTIVE IMPAIRMENTS: Abnormal gait, decreased balance, decreased cognition, decreased strength, and decreased safety awareness.   ACTIVITY LIMITATIONS: standing, squatting, stairs, and locomotion level  PARTICIPATION LIMITATIONS: driving, community activity, and yard work  PERSONAL FACTORS: Age and 1-2  comorbidities: anxiety, lewy body dementia  are also affecting patient's functional outcome.   REHAB POTENTIAL: Good  CLINICAL DECISION MAKING: Stable/uncomplicated  EVALUATION COMPLEXITY: Low  PLAN:  PT FREQUENCY: 1-2x/week  PT DURATION: 8 weeks  PLANNED INTERVENTIONS: Therapeutic exercises, Therapeutic activity, Neuromuscular re-education, Balance training, Gait training, Patient/Family education, Self Care, and Joint mobilization  PLAN FOR NEXT SESSION: Continue LE strengthening; add balance and gait retraining activities; dual tasking activities; focus on higher level balance activities   Merdis Delay, PT, DPT, OCS  [id number]  Pincus Badder, PT 09/30/2022, 2:16 PM

## 2022-10-07 ENCOUNTER — Ambulatory Visit: Payer: Medicare Other

## 2022-10-07 DIAGNOSIS — M6281 Muscle weakness (generalized): Secondary | ICD-10-CM

## 2022-10-07 DIAGNOSIS — R2681 Unsteadiness on feet: Secondary | ICD-10-CM | POA: Diagnosis not present

## 2022-10-07 DIAGNOSIS — R2689 Other abnormalities of gait and mobility: Secondary | ICD-10-CM

## 2022-10-07 NOTE — Therapy (Signed)
OUTPATIENT PHYSICAL THERAPY NEURO TREATMENT   Patient Name: Cory Hoover MRN: [id number] DOB:[birth year], 72 y.o., male Today's Date: 10/07/2022   PCP: Dr. Netty Starring, MD REFERRING PROVIDER: Dr. Manuella Ghazi, MD  END OF SESSION:  PT End of Session - 10/07/22 1424     Visit Number 4    Number of Visits 17    Date for PT Re-Evaluation 11/13/22    Authorization Type Medicare    Authorization - Visit Number 4    Authorization - Number of Visits 10    PT Start Time 1205    PT Stop Time [id number]    PT Time Calculation (min) 40 min    Equipment Utilized During Treatment Gait belt    Activity Tolerance Patient tolerated treatment well    Behavior During Therapy WFL for tasks assessed/performed             Past Medical History:  Diagnosis Date   Anxiety    Aortic atherosclerosis ([place])    BPH (benign prostatic hyperplasia)    Coronary artery disease    Current use of long term anticoagulation    Clopidogrel   Diverticulosis    ED (erectile dysfunction)    GERD (gastroesophageal reflux disease)    History of hiatal hernia    History of impacted cerumen    HLD (hyperlipidemia)    HTN (hypertension)    PVD (peripheral vascular disease) ([place])    Past Surgical History:  Procedure Laterality Date   COLONOSCOPY WITH PROPOFOL N/A 09/10/2022   Procedure: COLONOSCOPY WITH PROPOFOL;  Surgeon: Lesly Rubenstein, MD;  Location: [facility] ENDOSCOPY;  Service: Endoscopy;  Laterality: N/A;   ESOPHAGEAL MANOMETRY N/A 12/27/2020   Procedure: ESOPHAGEAL MANOMETRY (EM);  Surgeon: Mauri Pole, MD;  Location: [facility] ENDOSCOPY;  Service: Endoscopy;  Laterality: N/A;   ESOPHAGOGASTRODUODENOSCOPY (EGD) WITH PROPOFOL N/A 01/18/2021   Procedure: ESOPHAGOGASTRODUODENOSCOPY (EGD) WITH PROPOFOL;  Surgeon: Lucilla Lame, MD;  Location: [facility] ENDOSCOPY;  Service: Endoscopy;  Laterality: N/A;   HEEL SPUR SURGERY Left    [place] IMPEDANCE STUDY N/A 12/27/2020   Procedure: [place] IMPEDANCE STUDY;  Surgeon: Mauri Pole, MD;  Location: [facility] ENDOSCOPY;  Service: Endoscopy;  Laterality: N/A;   VASECTOMY     XI ROBOTIC ASSISTED HIATAL HERNIA REPAIR N/A 03/01/2021   Procedure: XI ROBOTIC ASSISTED HIATAL HERNIA REPAIR WITH MESH RNFA to assist;  Surgeon: Jules Husbands, MD;  Location: [facility] ORS;  Service: General;  Laterality: N/A;   Patient Active Problem List   Diagnosis Date Noted   Generalized weakness 03/03/2021   Hypermagnesemia 03/03/2021   Hallucinations 03/03/2021   Prerenal azotemia 03/03/2021   Transaminitis 03/03/2021   HLD (hyperlipidemia)    HTN (hypertension)    Frequent falls 03/02/2021   S/P repair of paraesophageal hernia 03/01/2021   Cough    Heartburn    Gastritis without bleeding    Gastroesophageal reflux disease 12/06/2020   History of TIA (transient ischemic attack) 06/19/2020   Acute focal neurological deficit 06/10/2020   BPH (benign prostatic hyperplasia) 06/10/2020   Abnormal ECG 12/08/2019   Benign prostatic hyperplasia with nocturia 10/19/2019   Coronary artery disease 10/19/2019   Gastroesophageal reflux disease with esophagitis 10/19/2019   Encounter for general adult medical examination without abnormal findings 04/10/2017   History of normocytic normochromic anemia 04/10/2017   Vaccine counseling 03/07/2016   Pure hypercholesterolemia 05/22/2015   Bradycardia 09/22/2014    ONSET DATE: within the last month  REFERRING DIAG: G31.83 (Lewy body dementia), balance/gait abnormalities  THERAPY  DIAG:  Unsteadiness on feet  Other abnormalities of gait and mobility  Muscle weakness (generalized)  Rationale for Evaluation and Treatment: Rehabilitation  From initial evaluation note: SUBJECTIVE:                                                                                                                                                                                             SUBJECTIVE STATEMENT: Pt states 3 weeks ago he was in the ER because he had trouble walking.  He saw Dr. Manuella Ghazi in the past 2 weeks and was diagnosed with Lewy Body Dementia.  He reports having difficulty with his balance for a few months.  He gradually has noticed that he has difficulty with foot placement while walking on uneven surfaces (trails); he has fallen twice in the past 3 months- while walking with friends on a trail and while playing recreational tennis on the tennis court.  Overall he feels like his L side is not as strong and coordinated as his right.  He also notes difficult with cognition, for example if he has to make a decision between 1 thing or another it takes him awhile to make a choice.  He is having some slight difficulty with word recall.  Also he expresses an increase in anxiety since receiving this diagnosis and has started medication to address this.  He does express fear and apprehension with his health.  His goal is to be able to maintain his strength, mobility, and independence as long as possible as the disease progresses.    PERTINENT HISTORY: Pt is retired since 2002 (used to work for [employer]).  Lives at home with his wife.  Has a multistory home which he plans to stay in forever; he and his wife had it built in the 41s.  He is very physically active- playing tennis 2x/wk, walking 1x/week, and doing machines  (bike, row,TM) at the gym 1-2x/week.  He regularly socializes with a group of friends that meet for lunch weekly.    PAIN:  Are you having pain? No  PRECAUTIONS: Fall  WEIGHT BEARING RESTRICTIONS: No  FALLS: Has patient fallen in last 6 months? Yes. Number of falls 2  LIVING ENVIRONMENT: Lives with: lives with their spouse Lives in: House/apartment Stairs: Yes: Internal: 1 flight steps; yes Has following equipment at home: None Pt states he is going to be having an elevator installed bc his bedroom is upstairs and media room is upstairs PLOF: Independent  PATIENT GOALS: to maintain his physical independence, mobility, and strength as long as  possible  OBJECTIVE:    COGNITION: Overall cognitive status: Within functional limits  for tasks assessed   SENSATION: In tact to light touch b/l LE  COORDINATION: Impaired with finger to nose and heel on shin movements L>R  POSTURE: rounded shoulders  LOWER EXTREMITY ROM:     Active  Right Eval Left Eval  Hip flexion    Hip extension    Hip abduction    Hip adduction    Hip internal rotation    Hip external rotation    Knee flexion    Knee extension    Ankle dorsiflexion    Ankle plantarflexion    Ankle inversion    Ankle eversion     (Blank rows = not tested)  LOWER EXTREMITY MMT:    MMT Right Eval Left Eval  Hip flexion 5 4  Hip extension    Hip abduction    Hip adduction    Hip internal rotation    Hip external rotation    Knee flexion 5 4  Knee extension 5 4  Ankle dorsiflexion 5 4  Ankle plantarflexion    Ankle inversion    Ankle eversion    (Blank rows = not tested)  BED MOBILITY:  Not assessed today  TRANSFERS: Assistive device utilized: None  Sit to stand: Complete Independence Stand to sit: Complete Independence Chair to chair: Complete Independence Floor:  not assessed today, deferred   GAIT: Gait pattern: decreased trunk rotation, trunk flexed, and wide BOS Distance walked: into/out of clinic independently Assistive device utilized: None Level of assistance: Complete Independence Comments: pt requires additional steps while turning 360 deg  FUNCTIONAL TESTS:  5 times sit to stand: 16 seconds 12.6 sec is age norm (Fall risk) PATIENT SURVEYS:  BERG Balance Test: 41/56 (Fall risk)  TODAY'S TREATMENT:                                                                                                                              SUBJECTIVE: Pt reports he had a biopsy procedure on Friday (3 places- L scapula, L hip, L ankle) as part of the diagnostic process for neurological condition.  He is instructed to not stress those 3 areas  intensely for a few days.  Also had his statin increased this week for cholesterol level.  He expresses anxiety about his health.       Therapeutic Exercise: Scifit Level 6 x 5 min for LE strength/endurance- not today to avoid hip region where he had tissue biopsy Heel raises: 2x12  Neuro Re-education: With gait belt and PT SBA  SLS: 3+ trials ea LE (best time ~15 seconds) Standing on bosu: 3x without UE support, focusing on trunk neuromuscular control (best trial 10-15 seconds) Tandem walking forward 4x, reverse 4x (parallel bars) Lateral stepping 4x ea direction (parallel bars) Tandem forward walk on airex: 4x (parallel bars)  Blue "ladder"- High knees (foot in each square) 4x Lateral high knees (foot in each square) R and L 4x ea Diagonal pattern: "R in, L in, R out,  L out, then L in, L out, L out, L in" in forward direction- 4 laps Braiding/Carioca: R direction and L direction; backwards only/forward only/backwards and then forwards complex pattern" holding hands with PT in hallpway   Not today: Squat/lift movement: 9 lb weight from 8 inch step ("sumo squat") 2x15 Goblet squat: 9 lb weight (mat table behind him) 2x12 Hamstring curl: 2x15 5# ankle weights Heel raises: 2x15 5# ankle weights Standing hip abd: with emphasis on upright trunk posture 2x10 R and L 5# ankle weights In parallel bars: Tandem walk on airex 4 laps Lateral walk on airex 4 laps Standing on airex feet together, upright trunk: 30 seconds x 3 Airex feet together with arms reach up/down x 10 ea Airex feet together with thoracic/lumbar/pelvic rotation reaching for ball and passing off to PT R and L x10 ea Airex feet together diagonal reach/pass from low to high R and L with PT x10 ea Alternating foot placement coordination on 6 inch step (b/l hands resting on railing) 1 min interval x 2, repeated standing on airex 1 min x 2 Obstacle course: step over hurdles, onto/over airex, onto/over step: focusing on visual  scan of obstacles but not looking down the whole time during activity: 4 laps in hallway   HEP instruction/pt education (see below)  PATIENT EDUCATION: Education details: PT POC, HEP instruction/progression  Person educated: Patient Education method: Explanation Education comprehension: verbalized understanding  HOME EXERCISE PROGRAM: Access Code: [email or web address] URL: [email or web address] Date: 09/30/2022 Prepared by: Merdis Delay  Exercises - Goblet Squat with Kettlebell  - 1 x daily - 3 x weekly - 2 sets - 20 reps - Standing Heel Raise with Chair Support  - 1 x daily - 3 x weekly - 2 sets - 20 reps - Standing Hamstring Curl with Chair Support  - 1 x daily - 3 x weekly - 2 sets - 20 reps - Seated Long Arc Quad with Ankle Weight  - 1 x daily - 3 x weekly - 2 sets - 20 reps - Standing Hip Abduction with Counter Support  - 1 x daily - 3 x weekly - 2 sets - 20 reps - Standing Hip Adduction with Counter Support  - 1 x daily - 3 x weekly - 2 sets - 20 reps - Step Taps on High Step  - 1 x daily - 3 x weekly - 3 sets - 10 reps - Standing with Feet Together on Foam Pad (BKA)  - 1 x daily - 3 x weekly - 30 sec hold  GOALS: Goals reviewed with patient? Yes  SHORT TERM GOALS: Target date: 10/17/22   Pt will be able to perform a HEP for balance/strengthening exercises independently Baseline: will establish at visit 2 Goal status: INITIAL  2.  Pt will be able to ascend/descend 1 flight of stairs with reciprocal gait pattern 2x without loss of balance to promote independently going upstairs to his bedroom  Baseline: will assess at visit 2 Goal status: INITIAL    LONG TERM GOALS: Target date: 11/13/22  Improve BERG to >46 indicating reduced risk for falls Baseline: 41 Goal status: INITIAL  2.  Improve 5x STS to <12 seconds indicating reduced risk for fall Baseline: 16 sec Goal status: INITIAL  3.  Improve SLS to >18 sec (age norm average) R and L on even and uneven  surfaces to promote independent amb on flat and uneven outdoor surfaces without AD Baseline: 9-15 sec today R/L Goal status: INITIAL    ASSESSMENT:  CLINICAL IMPRESSION: Modified today's tx session as pt had recent tissue biopsy on L scapula, hip, ankle and has been instructed to not stress those areas for a few days.  Focused primarily on balance and coordination exercises, including an emphasis on activities requiring multi sequenced motor planning and activation.  He did have episodes of temporarily freezing, especially when initiating movement pattern with L LE.  Had difficulty with step placement accuracy with higher level stepping activities.  PT close SBA/supervision throughout, and hand hold support needed for braiding/carioca.  Overall he tolerated today's session well and should benefit from incorporating balance activities into his HEP to promote improved safety during amb.  OBJECTIVE IMPAIRMENTS: Abnormal gait, decreased balance, decreased cognition, decreased strength, and decreased safety awareness.   ACTIVITY LIMITATIONS: standing, squatting, stairs, and locomotion level  PARTICIPATION LIMITATIONS: driving, community activity, and yard work  PERSONAL FACTORS: Age and 1-2 comorbidities: anxiety, lewy body dementia  are also affecting patient's functional outcome.   REHAB POTENTIAL: Good  CLINICAL DECISION MAKING: Stable/uncomplicated  EVALUATION COMPLEXITY: Low  PLAN:  PT FREQUENCY: 1-2x/week  PT DURATION: 8 weeks  PLANNED INTERVENTIONS: Therapeutic exercises, Therapeutic activity, Neuromuscular re-education, Balance training, Gait training, Patient/Family education, Self Care, and Joint mobilization  PLAN FOR NEXT SESSION: Continue LE strengthening; add balance and gait retraining activities; dual tasking activities; focus on higher level balance activities   Merdis Delay, PT, DPT, OCS  [id number]  Pincus Badder, PT 10/07/2022, 4:12 PM

## 2022-10-14 ENCOUNTER — Ambulatory Visit: Payer: Medicare Other

## 2022-10-14 DIAGNOSIS — R2681 Unsteadiness on feet: Secondary | ICD-10-CM

## 2022-10-14 DIAGNOSIS — M6281 Muscle weakness (generalized): Secondary | ICD-10-CM

## 2022-10-14 DIAGNOSIS — R2689 Other abnormalities of gait and mobility: Secondary | ICD-10-CM

## 2022-10-14 NOTE — Therapy (Addendum)
OUTPATIENT PHYSICAL THERAPY NEURO TREATMENT   Patient Name: Cory Hoover MRN: [id number] DOB:1951/01/12, 72 y.o., male Today's Date: 10/14/2022   PCP: Dr. Netty Starring, MD REFERRING PROVIDER: Dr. Manuella Ghazi, MD  END OF SESSION:  PT End of Session - 10/14/22 1750     Visit Number 5    Number of Visits 17    Date for PT Re-Evaluation 11/13/22    Authorization Type Medicare    Authorization - Number of Visits 10    PT Start Time 1205    PT Stop Time 1245    PT Time Calculation (min) 40 min    Equipment Utilized During Treatment Gait belt    Activity Tolerance Patient tolerated treatment well    Behavior During Therapy WFL for tasks assessed/performed             Past Medical History:  Diagnosis Date   Anxiety    Aortic atherosclerosis ([place])    BPH (benign prostatic hyperplasia)    Coronary artery disease    Current use of long term anticoagulation    Clopidogrel   Diverticulosis    ED (erectile dysfunction)    GERD (gastroesophageal reflux disease)    History of hiatal hernia    History of impacted cerumen    HLD (hyperlipidemia)    HTN (hypertension)    PVD (peripheral vascular disease) ([place])    Past Surgical History:  Procedure Laterality Date   COLONOSCOPY WITH PROPOFOL N/A 09/10/2022   Procedure: COLONOSCOPY WITH PROPOFOL;  Surgeon: Lesly Rubenstein, MD;  Location: [facility] ENDOSCOPY;  Service: Endoscopy;  Laterality: N/A;   ESOPHAGEAL MANOMETRY N/A 12/27/2020   Procedure: ESOPHAGEAL MANOMETRY (EM);  Surgeon: Mauri Pole, MD;  Location: [facility] ENDOSCOPY;  Service: Endoscopy;  Laterality: N/A;   ESOPHAGOGASTRODUODENOSCOPY (EGD) WITH PROPOFOL N/A 01/18/2021   Procedure: ESOPHAGOGASTRODUODENOSCOPY (EGD) WITH PROPOFOL;  Surgeon: Lucilla Lame, MD;  Location: [facility] ENDOSCOPY;  Service: Endoscopy;  Laterality: N/A;   HEEL SPUR SURGERY Left    [place] IMPEDANCE STUDY N/A 12/27/2020   Procedure: [place] IMPEDANCE STUDY;  Surgeon: Mauri Pole, MD;  Location: [facility] ENDOSCOPY;   Service: Endoscopy;  Laterality: N/A;   VASECTOMY     XI ROBOTIC ASSISTED HIATAL HERNIA REPAIR N/A 03/01/2021   Procedure: XI ROBOTIC ASSISTED HIATAL HERNIA REPAIR WITH MESH RNFA to assist;  Surgeon: Jules Husbands, MD;  Location: [facility] ORS;  Service: General;  Laterality: N/A;   Patient Active Problem List   Diagnosis Date Noted   Generalized weakness 03/03/2021   Hypermagnesemia 03/03/2021   Hallucinations 03/03/2021   Prerenal azotemia 03/03/2021   Transaminitis 03/03/2021   HLD (hyperlipidemia)    HTN (hypertension)    Frequent falls 03/02/2021   S/P repair of paraesophageal hernia 03/01/2021   Cough    Heartburn    Gastritis without bleeding    Gastroesophageal reflux disease 12/06/2020   History of TIA (transient ischemic attack) 06/19/2020   Acute focal neurological deficit 06/10/2020   BPH (benign prostatic hyperplasia) 06/10/2020   Abnormal ECG 12/08/2019   Benign prostatic hyperplasia with nocturia 10/19/2019   Coronary artery disease 10/19/2019   Gastroesophageal reflux disease with esophagitis 10/19/2019   Encounter for general adult medical examination without abnormal findings 04/10/2017   History of normocytic normochromic anemia 04/10/2017   Vaccine counseling 03/07/2016   Pure hypercholesterolemia 05/22/2015   Bradycardia 09/22/2014    ONSET DATE: within the last month  REFERRING DIAG: G31.83 (Lewy body dementia), balance/gait abnormalities  THERAPY DIAG:  Unsteadiness on feet  Other abnormalities  of gait and mobility  Muscle weakness (generalized)  Rationale for Evaluation and Treatment: Rehabilitation  From initial evaluation note: SUBJECTIVE:                                                                                                                                                                                             SUBJECTIVE STATEMENT: Pt states 3 weeks ago he was in the ER because he had trouble walking. He saw Dr. Manuella Ghazi in the past 2  weeks and was diagnosed with Lewy Body Dementia.  He reports having difficulty with his balance for a few months.  He gradually has noticed that he has difficulty with foot placement while walking on uneven surfaces (trails); he has fallen twice in the past 3 months- while walking with friends on a trail and while playing recreational tennis on the tennis court.  Overall he feels like his L side is not as strong and coordinated as his right.  He also notes difficult with cognition, for example if he has to make a decision between 1 thing or another it takes him awhile to make a choice.  He is having some slight difficulty with word recall.  Also he expresses an increase in anxiety since receiving this diagnosis and has started medication to address this.  He does express fear and apprehension with his health.  His goal is to be able to maintain his strength, mobility, and independence as long as possible as the disease progresses.    PERTINENT HISTORY: Pt is retired since 2002 (used to work for [employer]).  Lives at home with his wife.  Has a multistory home which he plans to stay in forever; he and his wife had it built in the 7s.  He is very physically active- playing tennis 2x/wk, walking 1x/week, and doing machines  (bike, row,TM) at the gym 1-2x/week.  He regularly socializes with a group of friends that meet for lunch weekly.    PAIN:  Are you having pain? No  PRECAUTIONS: Fall  WEIGHT BEARING RESTRICTIONS: No  FALLS: Has patient fallen in last 6 months? Yes. Number of falls 2  LIVING ENVIRONMENT: Lives with: lives with their spouse Lives in: House/apartment Stairs: Yes: Internal: 1 flight steps; yes Has following equipment at home: None Pt states he is going to be having an elevator installed bc his bedroom is upstairs and media room is upstairs PLOF: Independent  PATIENT GOALS: to maintain his physical independence, mobility, and strength as long as possible  OBJECTIVE:     COGNITION: Overall cognitive status: Within functional limits for tasks assessed   SENSATION: In tact  to light touch b/l LE  COORDINATION: Impaired with finger to nose and heel on shin movements L>R  POSTURE: rounded shoulders  LOWER EXTREMITY ROM:     Active  Right Eval Left Eval  Hip flexion    Hip extension    Hip abduction    Hip adduction    Hip internal rotation    Hip external rotation    Knee flexion    Knee extension    Ankle dorsiflexion    Ankle plantarflexion    Ankle inversion    Ankle eversion     (Blank rows = not tested)  LOWER EXTREMITY MMT:    MMT Right Eval Left Eval  Hip flexion 5 4  Hip extension    Hip abduction    Hip adduction    Hip internal rotation    Hip external rotation    Knee flexion 5 4  Knee extension 5 4  Ankle dorsiflexion 5 4  Ankle plantarflexion    Ankle inversion    Ankle eversion    (Blank rows = not tested)  BED MOBILITY:  Not assessed today  TRANSFERS: Assistive device utilized: None  Sit to stand: Complete Independence Stand to sit: Complete Independence Chair to chair: Complete Independence Floor:  not assessed today, deferred   GAIT: Gait pattern: decreased trunk rotation, trunk flexed, and wide BOS Distance walked: into/out of clinic independently Assistive device utilized: None Level of assistance: Complete Independence Comments: pt requires additional steps while turning 360 deg  FUNCTIONAL TESTS:  5 times sit to stand: 16 seconds 12.6 sec is age norm (Fall risk) PATIENT SURVEYS:  BERG Balance Test: 41/56 (Fall risk)  TODAY'S TREATMENT:                                                                                                                              SUBJECTIVE: Pt worked on [employer]- performing the standing exercises without ankle weights right now.  He has questions about the coordination exercises we did last session and wonders if working on similar exercises are important at home.   His foot sometimes freezes for a second when stepping down stairs, if he concentrates he can place it in the correct spot- he has misjudged steps though and missed bottom 2 resulting in a fall.     Therapeutic Exercise: Scifit Level 6 x 5 min for LE strength/endurance- not today Heel raises: 2x12 Sit to stand 2x5 Chair squats: 10# weight 2x8 Squat to pick up cone from floor (positioned in front/side) x 10 (demonstrates good control today) Split squat on soft blue mat: x10 with R leg in front, x10 with L LE in front Transfer standing to quadruped on blue mat- pt able to perform without requiring UE support, able to transfer to standing with R LE in front from tall kneeling Transfer standing to sidelying (via quadruped), rolling R/rolling L, transfer to quadruped and then standing: 4 reps  Neuro Re-education: Tandem walking forward  4x, reverse 4x (parallel bars) Alternating step up/reverse down: LRRL then RLLR-  for coordination; 3 rounds x 45 seconds ea Braiding/Carioca: R direction and L direction; backwards only/forward only/backwards and then forwards complex pattern" holding 1 parallel bar  Updated HEP with addition of carioca and step up coordination exercises into HEP; discussed benefit of HEP and also why practicing higher level balance exercises are safer with PT than independently at home right now   Not today: SLS: 3+ trials ea LE (best time ~15 seconds) Standing on bosu: 3x without UE support, focusing on trunk neuromuscular control (best trial 10-15 seconds) Blue "ladder"- High knees (foot in each square) 4x Lateral high knees (foot in each square) R and L 4x ea Diagonal pattern: "R in, L in, R out, L out, then L in, L out, L out, L in" in forward direction- 4 laps Hamstring curl: 2x15 5# ankle weights Heel raises: 2x15 5# ankle weights Standing hip abd: with emphasis on upright trunk posture 2x10 R and L 5# ankle weights In parallel bars: Tandem walk on airex 4  laps Lateral walk on airex 4 laps Standing on airex feet together, upright trunk: 30 seconds x 3 Airex feet together with arms reach up/down x 10 ea Airex feet together with thoracic/lumbar/pelvic rotation reaching for ball and passing off to PT R and L x10 ea Airex feet together diagonal reach/pass from low to high R and L with PT x10 ea Alternating foot placement coordination on 6 inch step (b/l hands resting on railing) 1 min interval x 2, repeated standing on airex 1 min x 2 Obstacle course: step over hurdles, onto/over airex, onto/over step: focusing on visual scan of obstacles but not looking down the whole time during activity: 4 laps in hallway   HEP instruction/pt education (see below)  PATIENT EDUCATION: Education details: PT POC, HEP instruction/progression  Person educated: Patient Education method: Explanation Education comprehension: verbalized understanding  HOME EXERCISE PROGRAM: Access Code: [email or web address] URL: [email or web address] Date: 10/14/2022 Prepared by: Merdis Delay  Exercises - Goblet Squat with Kettlebell  - 1 x daily - 3 x weekly - 2 sets - 20 reps - Standing Heel Raise with Chair Support  - 1 x daily - 3 x weekly - 2 sets - 20 reps - Standing Hamstring Curl with Chair Support  - 1 x daily - 3 x weekly - 2 sets - 20 reps - Seated Long Arc Quad with Ankle Weight  - 1 x daily - 3 x weekly - 2 sets - 20 reps - Standing Hip Abduction with Counter Support  - 1 x daily - 3 x weekly - 2 sets - 20 reps - Standing Hip Adduction with Counter Support  - 1 x daily - 3 x weekly - 2 sets - 20 reps - Step Taps on High Step  - 1 x daily - 3 x weekly - 3 sets - 10 reps - Standing with Feet Together on Foam Pad (BKA)  - 1 x daily - 3 x weekly - 30 sec hold - Carioca with Counter Support  - 1 x daily - 3 reps - Step Up  - 1 x daily - 3 reps - Split Squats Forward Trunk  - 1 x daily - 2 sets - 10 reps  GOALS: Goals reviewed with patient? Yes  SHORT TERM  GOALS: Target date: 10/17/22   Pt will be able to perform a HEP for balance/strengthening exercises independently Baseline: will establish at visit 2 Goal status: INITIAL  2.  Pt will be able to ascend/descend 1 flight of stairs with reciprocal gait pattern 2x without loss of balance to promote independently going upstairs to his bedroom  Baseline: will assess at visit 2 Goal status: INITIAL    LONG TERM GOALS: Target date: 11/13/22  Improve BERG to >46 indicating reduced risk for falls Baseline: 41 Goal status: INITIAL  2.  Improve 5x STS to <12 seconds indicating reduced risk for fall Baseline: 16 sec Goal status: INITIAL  3.  Improve SLS to >18 sec (age norm average) R and L on even and uneven surfaces to promote independent amb on flat and uneven outdoor surfaces without AD Baseline: 9-15 sec today R/L Goal status: INITIAL    ASSESSMENT:  CLINICAL IMPRESSION: Pt demonstrated ability to independently transfer to/from the floor safely today in the clinic without requiring additional UE support.  Practicing this technique is important for safety during home activities, promotes independence and strength, and is an important part of balance training/fall recovery training with his condition.  He demonstrates a few episodes of "freezing" with coordination exercises today; able to continue with brief pause.  Overall he tolerated today's session well and should benefit from incorporating new balance activities into his HEP to promote improved safety during amb.  OBJECTIVE IMPAIRMENTS: Abnormal gait, decreased balance, decreased cognition, decreased strength, and decreased safety awareness.   ACTIVITY LIMITATIONS: standing, squatting, stairs, and locomotion level  PARTICIPATION LIMITATIONS: driving, community activity, and yard work  PERSONAL FACTORS: Age and 1-2 comorbidities: anxiety, lewy body dementia  are also affecting patient's functional outcome.   REHAB POTENTIAL:  Good  CLINICAL DECISION MAKING: Stable/uncomplicated  EVALUATION COMPLEXITY: Low  PLAN:  PT FREQUENCY: 1-2x/week  PT DURATION: 8 weeks  PLANNED INTERVENTIONS: Therapeutic exercises, Therapeutic activity, Neuromuscular re-education, Balance training, Gait training, Patient/Family education, Self Care, and Joint mobilization  PLAN FOR NEXT SESSION: Continue LE strengthening; add balance and gait retraining activities; dual tasking activities; focus on higher level balance activities   Merdis Delay, PT, DPT, OCS  [id number]  Pincus Badder, PT 10/14/2022, 5:51 PM

## 2022-10-21 ENCOUNTER — Ambulatory Visit: Payer: Medicare Other

## 2022-10-21 DIAGNOSIS — M6281 Muscle weakness (generalized): Secondary | ICD-10-CM

## 2022-10-21 DIAGNOSIS — R2681 Unsteadiness on feet: Secondary | ICD-10-CM

## 2022-10-21 DIAGNOSIS — R2689 Other abnormalities of gait and mobility: Secondary | ICD-10-CM

## 2022-10-21 NOTE — Therapy (Signed)
OUTPATIENT PHYSICAL THERAPY NEURO TREATMENT   Patient Name: Cory Hoover MRN: [id number] DOB:04-05-1951, 72 y.o., male Today's Date: 10/21/2022   PCP: Dr. Netty Starring, MD REFERRING PROVIDER: Dr. Manuella Ghazi, MD  END OF SESSION:  PT End of Session - 10/21/22 1204     Visit Number 6    Number of Visits 17    Date for PT Re-Evaluation 11/13/22    Authorization Type Medicare    Authorization - Number of Visits 10    PT Start Time 1205    PT Stop Time 1250    PT Time Calculation (min) 45 min    Equipment Utilized During Treatment Gait belt    Activity Tolerance Patient tolerated treatment well    Behavior During Therapy WFL for tasks assessed/performed             Past Medical History:  Diagnosis Date   Anxiety    Aortic atherosclerosis ([place])    BPH (benign prostatic hyperplasia)    Coronary artery disease    Current use of long term anticoagulation    Clopidogrel   Diverticulosis    ED (erectile dysfunction)    GERD (gastroesophageal reflux disease)    History of hiatal hernia    History of impacted cerumen    HLD (hyperlipidemia)    HTN (hypertension)    PVD (peripheral vascular disease) ([place])    Past Surgical History:  Procedure Laterality Date   COLONOSCOPY WITH PROPOFOL N/A 09/10/2022   Procedure: COLONOSCOPY WITH PROPOFOL;  Surgeon: Lesly Rubenstein, MD;  Location: [facility] ENDOSCOPY;  Service: Endoscopy;  Laterality: N/A;   ESOPHAGEAL MANOMETRY N/A 12/27/2020   Procedure: ESOPHAGEAL MANOMETRY (EM);  Surgeon: Mauri Pole, MD;  Location: [facility] ENDOSCOPY;  Service: Endoscopy;  Laterality: N/A;   ESOPHAGOGASTRODUODENOSCOPY (EGD) WITH PROPOFOL N/A 01/18/2021   Procedure: ESOPHAGOGASTRODUODENOSCOPY (EGD) WITH PROPOFOL;  Surgeon: Lucilla Lame, MD;  Location: [facility] ENDOSCOPY;  Service: Endoscopy;  Laterality: N/A;   HEEL SPUR SURGERY Left    [place] IMPEDANCE STUDY N/A 12/27/2020   Procedure: [place] IMPEDANCE STUDY;  Surgeon: Mauri Pole, MD;  Location: [facility] ENDOSCOPY;   Service: Endoscopy;  Laterality: N/A;   VASECTOMY     XI ROBOTIC ASSISTED HIATAL HERNIA REPAIR N/A 03/01/2021   Procedure: XI ROBOTIC ASSISTED HIATAL HERNIA REPAIR WITH MESH RNFA to assist;  Surgeon: Jules Husbands, MD;  Location: [facility] ORS;  Service: General;  Laterality: N/A;   Patient Active Problem List   Diagnosis Date Noted   Generalized weakness 03/03/2021   Hypermagnesemia 03/03/2021   Hallucinations 03/03/2021   Prerenal azotemia 03/03/2021   Transaminitis 03/03/2021   HLD (hyperlipidemia)    HTN (hypertension)    Frequent falls 03/02/2021   S/P repair of paraesophageal hernia 03/01/2021   Cough    Heartburn    Gastritis without bleeding    Gastroesophageal reflux disease 12/06/2020   History of TIA (transient ischemic attack) 06/19/2020   Acute focal neurological deficit 06/10/2020   BPH (benign prostatic hyperplasia) 06/10/2020   Abnormal ECG 12/08/2019   Benign prostatic hyperplasia with nocturia 10/19/2019   Coronary artery disease 10/19/2019   Gastroesophageal reflux disease with esophagitis 10/19/2019   Encounter for general adult medical examination without abnormal findings 04/10/2017   History of normocytic normochromic anemia 04/10/2017   Vaccine counseling 03/07/2016   Pure hypercholesterolemia 05/22/2015   Bradycardia 09/22/2014    ONSET DATE: within the last month  REFERRING DIAG: G31.83 (Lewy body dementia), balance/gait abnormalities  THERAPY DIAG:  Unsteadiness on feet  Other abnormalities  of gait and mobility  Muscle weakness (generalized)  Rationale for Evaluation and Treatment: Rehabilitation  From initial evaluation note: SUBJECTIVE:                                                                                                                                                                                             SUBJECTIVE STATEMENT: Pt states 3 weeks ago he was in the ER because he had trouble walking. He saw Dr. Manuella Ghazi in the past 2  weeks and was diagnosed with Lewy Body Dementia.  He reports having difficulty with his balance for a few months.  He gradually has noticed that he has difficulty with foot placement while walking on uneven surfaces (trails); he has fallen twice in the past 3 months- while walking with friends on a trail and while playing recreational tennis on the tennis court.  Overall he feels like his L side is not as strong and coordinated as his right.  He also notes difficult with cognition, for example if he has to make a decision between 1 thing or another it takes him awhile to make a choice.  He is having some slight difficulty with word recall.  Also he expresses an increase in anxiety since receiving this diagnosis and has started medication to address this.  He does express fear and apprehension with his health.  His goal is to be able to maintain his strength, mobility, and independence as long as possible as the disease progresses.    PERTINENT HISTORY: Pt is retired since 2002 (used to work for [employer]).  Lives at home with his wife.  Has a multistory home which he plans to stay in forever; he and his wife had it built in the 33s.  He is very physically active- playing tennis 2x/wk, walking 1x/week, and doing machines  (bike, row,TM) at the gym 1-2x/week.  He regularly socializes with a group of friends that meet for lunch weekly.    PAIN:  Are you having pain? No  PRECAUTIONS: Fall  WEIGHT BEARING RESTRICTIONS: No  FALLS: Has patient fallen in last 6 months? Yes. Number of falls 2  LIVING ENVIRONMENT: Lives with: lives with their spouse Lives in: House/apartment Stairs: Yes: Internal: 1 flight steps; yes Has following equipment at home: None Pt states he is going to be having an elevator installed bc his bedroom is upstairs and media room is upstairs PLOF: Independent  PATIENT GOALS: to maintain his physical independence, mobility, and strength as long as possible  OBJECTIVE:     COGNITION: Overall cognitive status: Within functional limits for tasks assessed   SENSATION: In tact  to light touch b/l LE  COORDINATION: Impaired with finger to nose and heel on shin movements L>R  POSTURE: rounded shoulders  LOWER EXTREMITY ROM:     Active  Right Eval Left Eval  Hip flexion    Hip extension    Hip abduction    Hip adduction    Hip internal rotation    Hip external rotation    Knee flexion    Knee extension    Ankle dorsiflexion    Ankle plantarflexion    Ankle inversion    Ankle eversion     (Blank rows = not tested)  LOWER EXTREMITY MMT:    MMT Right Eval Left Eval  Hip flexion 5 4  Hip extension    Hip abduction    Hip adduction    Hip internal rotation    Hip external rotation    Knee flexion 5 4  Knee extension 5 4  Ankle dorsiflexion 5 4  Ankle plantarflexion    Ankle inversion    Ankle eversion    (Blank rows = not tested)  BED MOBILITY:  Not assessed today  TRANSFERS: Assistive device utilized: None  Sit to stand: Complete Independence Stand to sit: Complete Independence Chair to chair: Complete Independence Floor:  not assessed today, deferred   GAIT: Gait pattern: decreased trunk rotation, trunk flexed, and wide BOS Distance walked: into/out of clinic independently Assistive device utilized: None Level of assistance: Complete Independence Comments: pt requires additional steps while turning 360 deg  FUNCTIONAL TESTS:  5 times sit to stand: 16 seconds 12.6 sec is age norm (Fall risk) PATIENT SURVEYS:  BERG Balance Test: 41/56 (Fall risk)  TODAY'S TREATMENT:                                                                                                                              SUBJECTIVE: Pt had 1 fall this week- he "froze" while going down the stairs at his house (co-carrying a microwave with his wife) and fell and bruised his back.  He had 2 near falls too- one while playing tennis- caught his foot on a  piece of tape marking a line on the clay court.  Therapeutic Exercise: Scifit Level 6 x 10 min for LE strength/endurance  Ascend/descend 4 stairs, 3x with single/bilateral railing  Neuro Re-education: Blue "ladder"- High knees (foot in each square) 4x forward Lateral high knees (foot in each square) R and L 4x ea  Diagonal pattern: "R in, L in, R out, L out, then L in, L out, L out, L in" in forward direction- 4 laps  Feet together on airex: 3 rounds in // bars, head turns R/L, up/down x20 ea Alternating step up/reverse down: LRRL then RLLR-  for coordination; 3 rounds x 45 seconds ea onto airex cushion  Standing on tilt board (A/P direction) in // bars- too unsteady  Forward/reverse walk on grass/uneven surface outside (PT SBA with gait belt) "Forward/back/right/left" directional changes  with PT- focusing on reaction to external verbal cues  Discussed benefits of continuing PT in some form through different stages of his condition.  Not today: Heel raises: 2x12 Sit to stand 2x5 Chair squats: 10# weight 2x8 Squat to pick up cone from floor (positioned in front/side) x 10 (demonstrates good control today) Split squat on soft blue mat: x10 with R leg in front, x10 with L LE in front Transfer standing to quadruped on blue mat- pt able to perform without requiring UE support, able to transfer to standing with R LE in front from tall kneeling Transfer standing to sidelying (via quadruped), rolling R/rolling L, transfer to quadruped and then standing: 4 reps SLS: 3+ trials ea LE (best time ~15 seconds) Standing on bosu: 3x without UE support, focusing on trunk neuromuscular control (best trial 10-15 seconds) Braiding/Carioca: R direction and L direction; backwards only/forward only/backwards and then forwards complex pattern" holding 1 parallel bar Hamstring curl: 2x15 5# ankle weights Heel raises: 2x15 5# ankle weights Standing hip abd: with emphasis on upright trunk posture 2x10 R  and L 5# ankle weights In parallel bars: Tandem walk on airex 4 laps Lateral walk on airex 4 laps Standing on airex feet together, upright trunk: 30 seconds x 3 Airex feet together with arms reach up/down x 10 ea Airex feet together with thoracic/lumbar/pelvic rotation reaching for ball and passing off to PT R and L x10 ea Airex feet together diagonal reach/pass from low to high R and L with PT x10 ea Alternating foot placement coordination on 6 inch step (b/l hands resting on railing) 1 min interval x 2, repeated standing on airex 1 min x 2 Obstacle course: step over hurdles, onto/over airex, onto/over step: focusing on visual scan of obstacles but not looking down the whole time during activity: 4 laps in hallway   HEP instruction/pt education (see below)  PATIENT EDUCATION: Education details: PT POC, HEP instruction/progression  Person educated: Patient Education method: Explanation Education comprehension: verbalized understanding  HOME EXERCISE PROGRAM: Access Code: [email or web address] URL: [email or web address] Date: 10/14/2022 Prepared by: Merdis Delay  Exercises - Goblet Squat with Kettlebell  - 1 x daily - 3 x weekly - 2 sets - 20 reps - Standing Heel Raise with Chair Support  - 1 x daily - 3 x weekly - 2 sets - 20 reps - Standing Hamstring Curl with Chair Support  - 1 x daily - 3 x weekly - 2 sets - 20 reps - Seated Long Arc Quad with Ankle Weight  - 1 x daily - 3 x weekly - 2 sets - 20 reps - Standing Hip Abduction with Counter Support  - 1 x daily - 3 x weekly - 2 sets - 20 reps - Standing Hip Adduction with Counter Support  - 1 x daily - 3 x weekly - 2 sets - 20 reps - Step Taps on High Step  - 1 x daily - 3 x weekly - 3 sets - 10 reps - Standing with Feet Together on Foam Pad (BKA)  - 1 x daily - 3 x weekly - 30 sec hold - Carioca with Counter Support  - 1 x daily - 3 reps - Step Up  - 1 x daily - 3 reps - Split Squats Forward Trunk  - 1 x daily - 2 sets - 10  reps  GOALS: Goals reviewed with patient? Yes  SHORT TERM GOALS: Target date: 10/17/22   Pt will be able to perform a HEP for balance/strengthening  exercises independently Baseline: will establish at visit 2 Goal status: INITIAL  2.  Pt will be able to ascend/descend 1 flight of stairs with reciprocal gait pattern 2x without loss of balance to promote independently going upstairs to his bedroom  Baseline: will assess at visit 2 Goal status: INITIAL    LONG TERM GOALS: Target date: 11/13/22  Improve BERG to >46 indicating reduced risk for falls Baseline: 41 Goal status: INITIAL  2.  Improve 5x STS to <12 seconds indicating reduced risk for fall Baseline: 16 sec Goal status: INITIAL  3.  Improve SLS to >18 sec (age norm average) R and L on even and uneven surfaces to promote independent amb on flat and uneven outdoor surfaces without AD Baseline: 9-15 sec today R/L Goal status: INITIAL    ASSESSMENT:  CLINICAL IMPRESSION: Pt experienced a few episodes of "freezing" with coordination exercises today; most noticeably with quick volitional alternate changes of foot placement (during ladder activity). Had 1 episode of b/l legs giving way, able to recover with SBA from PT.  Overall he tolerated today's session well and should continue to benefit from skilled PT interventions to reduce fall risk and improve safety while amb on and navigating uneven surfaces and quick direction changes requiring quick reactions.  OBJECTIVE IMPAIRMENTS: Abnormal gait, decreased balance, decreased cognition, decreased strength, and decreased safety awareness.   ACTIVITY LIMITATIONS: standing, squatting, stairs, and locomotion level  PARTICIPATION LIMITATIONS: driving, community activity, and yard work  PERSONAL FACTORS: Age and 1-2 comorbidities: anxiety, lewy body dementia  are also affecting patient's functional outcome.   REHAB POTENTIAL: Good  CLINICAL DECISION MAKING:  Stable/uncomplicated  EVALUATION COMPLEXITY: Low  PLAN:  PT FREQUENCY: 1-2x/week  PT DURATION: 8 weeks  PLANNED INTERVENTIONS: Therapeutic exercises, Therapeutic activity, Neuromuscular re-education, Balance training, Gait training, Patient/Family education, Self Care, and Joint mobilization  PLAN FOR NEXT SESSION: Continue LE strengthening; add balance and gait retraining activities; dual tasking activities; focus on higher level balance activities   Merdis Delay, PT, DPT, OCS  [id number]  Pincus Badder, PT 10/21/2022, 5:42 PM

## 2022-10-28 ENCOUNTER — Ambulatory Visit: Payer: Medicare Other | Attending: Neurology

## 2022-10-28 DIAGNOSIS — M6281 Muscle weakness (generalized): Secondary | ICD-10-CM | POA: Insufficient documentation

## 2022-10-28 DIAGNOSIS — R2689 Other abnormalities of gait and mobility: Secondary | ICD-10-CM | POA: Diagnosis present

## 2022-10-28 DIAGNOSIS — R2681 Unsteadiness on feet: Secondary | ICD-10-CM | POA: Diagnosis present

## 2022-10-28 NOTE — Therapy (Signed)
OUTPATIENT PHYSICAL THERAPY NEURO TREATMENT   Patient Name: Cory Hoover MRN: [id number] DOB:[birth year], 72 y.o., male Today's Date: 10/28/2022   PCP: Dr. Netty Starring, MD REFERRING PROVIDER: Dr. Manuella Ghazi, MD  END OF SESSION:  PT End of Session - 10/28/22 1216     Visit Number 7    Number of Visits 17    Date for PT Re-Evaluation 11/13/22    Authorization Type Medicare    Authorization - Visit Number 7    Authorization - Number of Visits 10    Progress Note Due on Visit 10    PT Start Time 1205    PT Stop Time 1250    PT Time Calculation (min) 45 min    Equipment Utilized During Treatment Gait belt    Activity Tolerance Patient tolerated treatment well    Behavior During Therapy WFL for tasks assessed/performed             Past Medical History:  Diagnosis Date   Anxiety    Aortic atherosclerosis ([place])    BPH (benign prostatic hyperplasia)    Coronary artery disease    Current use of long term anticoagulation    Clopidogrel   Diverticulosis    ED (erectile dysfunction)    GERD (gastroesophageal reflux disease)    History of hiatal hernia    History of impacted cerumen    HLD (hyperlipidemia)    HTN (hypertension)    PVD (peripheral vascular disease) ([place])    Past Surgical History:  Procedure Laterality Date   COLONOSCOPY WITH PROPOFOL N/A 09/10/2022   Procedure: COLONOSCOPY WITH PROPOFOL;  Surgeon: Lesly Rubenstein, MD;  Location: [facility] ENDOSCOPY;  Service: Endoscopy;  Laterality: N/A;   ESOPHAGEAL MANOMETRY N/A 12/27/2020   Procedure: ESOPHAGEAL MANOMETRY (EM);  Surgeon: Mauri Pole, MD;  Location: [facility] ENDOSCOPY;  Service: Endoscopy;  Laterality: N/A;   ESOPHAGOGASTRODUODENOSCOPY (EGD) WITH PROPOFOL N/A 01/18/2021   Procedure: ESOPHAGOGASTRODUODENOSCOPY (EGD) WITH PROPOFOL;  Surgeon: Lucilla Lame, MD;  Location[facility] ENDOSCOPY;  Service: Endoscopy;  Laterality: N/A;   HEEL SPUR SURGERY Left    [place] IMPEDANCE STUDY N/A 12/27/2020   Procedure: [place] IMPEDANCE  STUDY;  Surgeon: Mauri Pole, MD;  Location: [facility] ENDOSCOPY;  Service: Endoscopy;  Laterality: N/A;   VASECTOMY     XI ROBOTIC ASSISTED HIATAL HERNIA REPAIR N/A 03/01/2021   Procedure: XI ROBOTIC ASSISTED HIATAL HERNIA REPAIR WITH MESH RNFA to assist;  Surgeon: Jules Husbands, MD;  Location: [facility] ORS;  Service: General;  Laterality: N/A;   Patient Active Problem List   Diagnosis Date Noted   Generalized weakness 03/03/2021   Hypermagnesemia 03/03/2021   Hallucinations 03/03/2021   Prerenal azotemia 03/03/2021   Transaminitis 03/03/2021   HLD (hyperlipidemia)    HTN (hypertension)    Frequent falls 03/02/2021   S/P repair of paraesophageal hernia 03/01/2021   Cough    Heartburn    Gastritis without bleeding    Gastroesophageal reflux disease 12/06/2020   History of TIA (transient ischemic attack) 06/19/2020   Acute focal neurological deficit 06/10/2020   BPH (benign prostatic hyperplasia) 06/10/2020   Abnormal ECG 12/08/2019   Benign prostatic hyperplasia with nocturia 10/19/2019   Coronary artery disease 10/19/2019   Gastroesophageal reflux disease with esophagitis 10/19/2019   Encounter for general adult medical examination without abnormal findings 04/10/2017   History of normocytic normochromic anemia 04/10/2017   Vaccine counseling 03/07/2016   Pure hypercholesterolemia 05/22/2015   Bradycardia 09/22/2014    ONSET DATE: within the last month  REFERRING  DIAG: G31.83 (Lewy body dementia), balance/gait abnormalities  THERAPY DIAG:  Unsteadiness on feet  Other abnormalities of gait and mobility  Muscle weakness (generalized)  Rationale for Evaluation and Treatment: Rehabilitation  From initial evaluation note: SUBJECTIVE:                                                                                                                                                                                             SUBJECTIVE STATEMENT: Pt states 3 weeks ago he was in the  ER because he had trouble walking. He saw Dr. Manuella Ghazi in the past 2 weeks and was diagnosed with Lewy Body Dementia.  He reports having difficulty with his balance for a few months.  He gradually has noticed that he has difficulty with foot placement while walking on uneven surfaces (trails); he has fallen twice in the past 3 months- while walking with friends on a trail and while playing recreational tennis on the tennis court.  Overall he feels like his L side is not as strong and coordinated as his right.  He also notes difficult with cognition, for example if he has to make a decision between 1 thing or another it takes him awhile to make a choice.  He is having some slight difficulty with word recall.  Also he expresses an increase in anxiety since receiving this diagnosis and has started medication to address this.  He does express fear and apprehension with his health.  His goal is to be able to maintain his strength, mobility, and independence as long as possible as the disease progresses.    PERTINENT HISTORY: Pt is retired since 2002 (used to work for [employer]).  Lives at home with his wife.  Has a multistory home which he plans to stay in forever; he and his wife had it built in the 33s.  He is very physically active- playing tennis 2x/wk, walking 1x/week, and doing machines  (bike, row,TM) at the gym 1-2x/week.  He regularly socializes with a group of friends that meet for lunch weekly.    PAIN:  Are you having pain? No  PRECAUTIONS: Fall  WEIGHT BEARING RESTRICTIONS: No  FALLS: Has patient fallen in last 6 months? Yes. Number of falls 2  LIVING ENVIRONMENT: Lives with: lives with their spouse Lives in: House/apartment Stairs: Yes: Internal: 1 flight steps; yes Has following equipment at home: None Pt states he is going to be having an elevator installed bc his bedroom is upstairs and media room is upstairs PLOF: Independent  PATIENT GOALS: to maintain his physical independence, mobility,  and strength as long as possible  OBJECTIVE:  COGNITION: Overall cognitive status: Within functional limits for tasks assessed   SENSATION: In tact to light touch b/l LE  COORDINATION: Impaired with finger to nose and heel on shin movements L>R  POSTURE: rounded shoulders  LOWER EXTREMITY ROM:     Active  Right Eval Left Eval  Hip flexion    Hip extension    Hip abduction    Hip adduction    Hip internal rotation    Hip external rotation    Knee flexion    Knee extension    Ankle dorsiflexion    Ankle plantarflexion    Ankle inversion    Ankle eversion     (Blank rows = not tested)  LOWER EXTREMITY MMT:    MMT Right Eval Left Eval  Hip flexion 5 4  Hip extension    Hip abduction    Hip adduction    Hip internal rotation    Hip external rotation    Knee flexion 5 4  Knee extension 5 4  Ankle dorsiflexion 5 4  Ankle plantarflexion    Ankle inversion    Ankle eversion    (Blank rows = not tested)  BED MOBILITY:  Not assessed today  TRANSFERS: Assistive device utilized: None  Sit to stand: Complete Independence Stand to sit: Complete Independence Chair to chair: Complete Independence Floor:  not assessed today, deferred   GAIT: Gait pattern: decreased trunk rotation, trunk flexed, and wide BOS Distance walked: into/out of clinic independently Assistive device utilized: None Level of assistance: Complete Independence Comments: pt requires additional steps while turning 360 deg  FUNCTIONAL TESTS:  5 times sit to stand: 16 seconds 12.6 sec is age norm (Fall risk) PATIENT SURVEYS:  BERG Balance Test: 41/56 (Fall risk)  TODAY'S TREATMENT:                                                                                                                              SUBJECTIVE: Pt reports no new complaints this week.  He worked on the braiding/carioca and feels more coordinated. Therapeutic Exercise: [place] Level 6 x 10 min for LE  strength/endurance  Ascend/descend 4 stairs, 3x with single/bilateral railing  Standing rows: 3x12 black band with narrow base (feet next to each other) and with staggered stance   Neuro Re-education: Brading/carioca: 2x ea direction  Tandem forward walk/reverse walk 4 laps in // bars  Step up onto bosu, balance, step down reverse: lead with R/L LE when PT stated "switch"  Lateral step up/on/over bosu 10x ea direction  Feet together on airex: 3 rounds in // bars, head turns R/L, up/down x20 ea  Alternating step up/reverse down: LRRL then RLLR-  for coordination; 3 rounds with PT verbal cues to "switch"  Forward/reverse walk on grass/uneven surface outside (PT SBA with gait belt) "Forward/back/right/left" directional changes with PT- focusing on reaction to external verbal cues  Discussed benefits of continuing PT in some form through different stages of his condition.  Not today: Heel raises: 2x12 Sit to stand 2x5 Chair squats: 10# weight 2x8 Squat to pick up cone from floor (positioned in front/side) x 10 (demonstrates good control today) Split squat on soft blue mat: x10 with R leg in front, x10 with L LE in front Transfer standing to quadruped on blue mat- pt able to perform without requiring UE support, able to transfer to standing with R LE in front from tall kneeling Transfer standing to sidelying (via quadruped), rolling R/rolling L, transfer to quadruped and then standing: 4 reps SLS: 3+ trials ea LE (best time ~15 seconds) Standing on bosu: 3x without UE support, focusing on trunk neuromuscular control (best trial 10-15 seconds) Braiding/Carioca: R direction and L direction; backwards only/forward only/backwards and then forwards complex pattern" holding 1 parallel bar Hamstring curl: 2x15 5# ankle weights Heel raises: 2x15 5# ankle weights Standing hip abd: with emphasis on upright trunk posture 2x10 R and L 5# ankle weights In parallel bars: Tandem walk on airex 4  laps Lateral walk on airex 4 laps Standing on airex feet together, upright trunk: 30 seconds x 3 Airex feet together with arms reach up/down x 10 ea Airex feet together with thoracic/lumbar/pelvic rotation reaching for ball and passing off to PT R and L x10 ea Airex feet together diagonal reach/pass from low to high R and L with PT x10 ea Alternating foot placement coordination on 6 inch step (b/l hands resting on railing) 1 min interval x 2, repeated standing on airex 1 min x 2 Obstacle course: step over hurdles, onto/over airex, onto/over step: focusing on visual scan of obstacles but not looking down the whole time during activity: 4 laps in hallway Blue "ladder"- High knees (foot in each square) 4x forward Lateral high knees (foot in each square) R and L 4x ea  Diagonal pattern: "R in, L in, R out, L out, then L in, L out, L out, L in" in forward direction- 4 laps  HEP instruction/pt education (see below)  PATIENT EDUCATION: Education details: PT POC, HEP instruction/progression  Person educated: Patient Education method: Explanation Education comprehension: verbalized understanding  HOME EXERCISE PROGRAM: Access Code: [email or web address] URL: [email or web address] Date: 10/14/2022 Prepared by: Merdis Delay  Exercises - Goblet Squat with Kettlebell  - 1 x daily - 3 x weekly - 2 sets - 20 reps - Standing Heel Raise with Chair Support  - 1 x daily - 3 x weekly - 2 sets - 20 reps - Standing Hamstring Curl with Chair Support  - 1 x daily - 3 x weekly - 2 sets - 20 reps - Seated Long Arc Quad with Ankle Weight  - 1 x daily - 3 x weekly - 2 sets - 20 reps - Standing Hip Abduction with Counter Support  - 1 x daily - 3 x weekly - 2 sets - 20 reps - Standing Hip Adduction with Counter Support  - 1 x daily - 3 x weekly - 2 sets - 20 reps - Step Taps on High Step  - 1 x daily - 3 x weekly - 3 sets - 10 reps - Standing with Feet Together on Foam Pad (BKA)  - 1 x daily - 3 x weekly  - 30 sec hold - Carioca with Counter Support  - 1 x daily - 3 reps - Step Up  - 1 x daily - 3 reps - Split Squats Forward Trunk  - 1 x daily - 2 sets - 10 reps  GOALS: Goals reviewed with  patient? Yes  SHORT TERM GOALS: Target date: 10/17/22   Pt will be able to perform a HEP for balance/strengthening exercises independently Baseline: will establish at visit 2 Goal status: INITIAL  2.  Pt will be able to ascend/descend 1 flight of stairs with reciprocal gait pattern 2x without loss of balance to promote independently going upstairs to his bedroom  Baseline: will assess at visit 2 Goal status: INITIAL    LONG TERM GOALS: Target date: 11/13/22  Improve BERG to >46 indicating reduced risk for falls Baseline: 41 Goal status: INITIAL  2.  Improve 5x STS to <12 seconds indicating reduced risk for fall Baseline: 16 sec Goal status: INITIAL  3.  Improve SLS to >18 sec (age norm average) R and L on even and uneven surfaces to promote independent amb on flat and uneven outdoor surfaces without AD Baseline: 9-15 sec today R/L Goal status: INITIAL    ASSESSMENT:  CLINICAL IMPRESSION: Pt SBA during balance exercises today, no major episodes of loss of balance during today's session.  Occasionally increased time to react noted with quick verbal cues to change directions or switch feet.  Overall, today was a good PT session.  Overall he tolerated tx well and should continue to benefit from skilled PT interventions to reduce fall risk and improve safety while amb on and navigating uneven surfaces and quick direction changes requiring quick reactions.  OBJECTIVE IMPAIRMENTS: Abnormal gait, decreased balance, decreased cognition, decreased strength, and decreased safety awareness.   ACTIVITY LIMITATIONS: standing, squatting, stairs, and locomotion level  PARTICIPATION LIMITATIONS: driving, community activity, and yard work  PERSONAL FACTORS: Age and 1-2 comorbidities: anxiety, lewy body  dementia  are also affecting patient's functional outcome.   REHAB POTENTIAL: Good  CLINICAL DECISION MAKING: Stable/uncomplicated  EVALUATION COMPLEXITY: Low  PLAN:  PT FREQUENCY: 1-2x/week  PT DURATION: 8 weeks  PLANNED INTERVENTIONS: Therapeutic exercises, Therapeutic activity, Neuromuscular re-education, Balance training, Gait training, Patient/Family education, Self Care, and Joint mobilization  PLAN FOR NEXT SESSION: Continue LE strengthening; add balance and gait retraining activities; dual tasking activities; focus on higher level balance activities   Merdis Delay, PT, DPT, OCS  [id number]  Pincus Badder, PT 10/28/2022, 5:24 PM

## 2022-11-04 ENCOUNTER — Ambulatory Visit: Payer: Medicare Other

## 2022-11-04 DIAGNOSIS — R2681 Unsteadiness on feet: Secondary | ICD-10-CM

## 2022-11-04 DIAGNOSIS — M6281 Muscle weakness (generalized): Secondary | ICD-10-CM

## 2022-11-04 DIAGNOSIS — R2689 Other abnormalities of gait and mobility: Secondary | ICD-10-CM

## 2022-11-04 NOTE — Therapy (Signed)
OUTPATIENT PHYSICAL THERAPY NEURO TREATMENT   Patient Name: Cory Hoover MRN: [id number] DOB:Jan 24, 1951, 72 y.o., male Today's Date: 11/04/2022   PCP: Dr. Burnadette Pop, MD REFERRING PROVIDER: Dr. Sherryll Burger, MD  END OF SESSION:  PT End of Session - 11/04/22 1215     Visit Number 8    Number of Visits 17    Date for PT Re-Evaluation 11/13/22    Authorization Type Medicare    Authorization - Number of Visits 10    Progress Note Due on Visit 10    PT Start Time 1200    PT Stop Time 1245    PT Time Calculation (min) 45 min    Equipment Utilized During Treatment Gait belt    Activity Tolerance Patient tolerated treatment well    Behavior During Therapy WFL for tasks assessed/performed             Past Medical History:  Diagnosis Date   Anxiety    Aortic atherosclerosis (HCC)    BPH (benign prostatic hyperplasia)    Coronary artery disease    Current use of long term anticoagulation    Clopidogrel   Diverticulosis    ED (erectile dysfunction)    GERD (gastroesophageal reflux disease)    History of hiatal hernia    History of impacted cerumen    HLD (hyperlipidemia)    HTN (hypertension)    PVD (peripheral vascular disease) (HCC)    Past Surgical History:  Procedure Laterality Date   COLONOSCOPY WITH PROPOFOL N/A 09/10/2022   Procedure: COLONOSCOPY WITH PROPOFOL;  Surgeon: [name], MD;  Location: [facility] ENDOSCOPY;  Service: Endoscopy;  Laterality: N/A;   ESOPHAGEAL MANOMETRY N/A 12/27/2020   Procedure: ESOPHAGEAL MANOMETRY (EM);  Surgeon: Napoleon Form, MD;  Location: [facility] ENDOSCOPY;  Service: Endoscopy;  Laterality: N/A;   ESOPHAGOGASTRODUODENOSCOPY (EGD) WITH PROPOFOL N/A 01/18/2021   Procedure: ESOPHAGOGASTRODUODENOSCOPY (EGD) WITH PROPOFOL;  Surgeon: Midge Minium, MD;  Location: [facility] ENDOSCOPY;  Service: Endoscopy;  Laterality: N/A;   HEEL SPUR SURGERY Left    PH IMPEDANCE STUDY N/A 12/27/2020   Procedure: PH IMPEDANCE STUDY;  Surgeon: Napoleon Form,  MD;  Location: [facility] ENDOSCOPY;  Service: Endoscopy;  Laterality: N/A;   VASECTOMY     XI ROBOTIC ASSISTED HIATAL HERNIA REPAIR N/A 03/01/2021   Procedure: XI ROBOTIC ASSISTED HIATAL HERNIA REPAIR WITH MESH RNFA to assist;  Surgeon: Leafy Ro, MD;  Location: [facility] ORS;  Service: General;  Laterality: N/A;   Patient Active Problem List   Diagnosis Date Noted   Generalized weakness 03/03/2021   Hypermagnesemia 03/03/2021   Hallucinations 03/03/2021   Prerenal azotemia 03/03/2021   Transaminitis 03/03/2021   HLD (hyperlipidemia)    HTN (hypertension)    Frequent falls 03/02/2021   S/P repair of paraesophageal hernia 03/01/2021   Cough    Heartburn    Gastritis without bleeding    Gastroesophageal reflux disease 12/06/2020   History of TIA (transient ischemic attack) 06/19/2020   Acute focal neurological deficit 06/10/2020   BPH (benign prostatic hyperplasia) 06/10/2020   Abnormal ECG 12/08/2019   Benign prostatic hyperplasia with nocturia 10/19/2019   Coronary artery disease 10/19/2019   Gastroesophageal reflux disease with esophagitis 10/19/2019   Encounter for general adult medical examination without abnormal findings 04/10/2017   History of normocytic normochromic anemia 04/10/2017   Vaccine counseling 03/07/2016   Pure hypercholesterolemia 05/22/2015   Bradycardia 09/22/2014    ONSET DATE: within the last month  REFERRING DIAG: G31.83 (Lewy body dementia), balance/gait abnormalities  THERAPY DIAG:  Unsteadiness on feet  Other abnormalities of gait and mobility  Muscle weakness (generalized)  Rationale for Evaluation and Treatment: Rehabilitation  From initial evaluation note: SUBJECTIVE:                                                                                                                                                                                             SUBJECTIVE STATEMENT: Pt states 3 weeks ago he was in the ER because he had trouble walking. He  saw Dr. Sherryll Burger in the past 2 weeks and was diagnosed with Lewy Body Dementia.  He reports having difficulty with his balance for a few months.  He gradually has noticed that he has difficulty with foot placement while walking on uneven surfaces (trails); he has fallen twice in the past 3 months- while walking with friends on a trail and while playing recreational tennis on the tennis court.  Overall he feels like his L side is not as strong and coordinated as his right.  He also notes difficult with cognition, for example if he has to make a decision between 1 thing or another it takes him awhile to make a choice.  He is having some slight difficulty with word recall.  Also he expresses an increase in anxiety since receiving this diagnosis and has started medication to address this.  He does express fear and apprehension with his health.  His goal is to be able to maintain his strength, mobility, and independence as long as possible as the disease progresses.    PERTINENT HISTORY: Pt is retired since 2002 (used to work for [employer]).  Lives at home with his wife.  Has a multistory home which he plans to stay in forever; he and his wife had it built in the 68s.  He is very physically active- playing tennis 2x/wk, walking 1x/week, and doing machines  (bike, row,TM) at the gym 1-2x/week.  He regularly socializes with a group of friends that meet for lunch weekly.    PAIN:  Are you having pain? No  PRECAUTIONS: Fall  WEIGHT BEARING RESTRICTIONS: No  FALLS: Has patient fallen in last 6 months? Yes. Number of falls 2  LIVING ENVIRONMENT: Lives with: lives with their spouse Lives in: House/apartment Stairs: Yes: Internal: 1 flight steps; yes Has following equipment at home: None Pt states he is going to be having an elevator installed bc his bedroom is upstairs and media room is upstairs PLOF: Independent  PATIENT GOALS: to maintain his physical independence, mobility, and strength as long as  possible  OBJECTIVE:    COGNITION: Overall cognitive status: Within functional  limits for tasks assessed   SENSATION: In tact to light touch b/l LE  COORDINATION: Impaired with finger to nose and heel on shin movements L>R  POSTURE: rounded shoulders  LOWER EXTREMITY ROM:     Active  Right Eval Left Eval  Hip flexion    Hip extension    Hip abduction    Hip adduction    Hip internal rotation    Hip external rotation    Knee flexion    Knee extension    Ankle dorsiflexion    Ankle plantarflexion    Ankle inversion    Ankle eversion     (Blank rows = not tested)  LOWER EXTREMITY MMT:    MMT Right Eval Left Eval  Hip flexion 5 4  Hip extension    Hip abduction    Hip adduction    Hip internal rotation    Hip external rotation    Knee flexion 5 4  Knee extension 5 4  Ankle dorsiflexion 5 4  Ankle plantarflexion    Ankle inversion    Ankle eversion    (Blank rows = not tested)  BED MOBILITY:  Not assessed today  TRANSFERS: Assistive device utilized: None  Sit to stand: Complete Independence Stand to sit: Complete Independence Chair to chair: Complete Independence Floor:  not assessed today, deferred   GAIT: Gait pattern: decreased trunk rotation, trunk flexed, and wide BOS Distance walked: into/out of clinic independently Assistive device utilized: None Level of assistance: Complete Independence Comments: pt requires additional steps while turning 360 deg  FUNCTIONAL TESTS:  5 times sit to stand: 16 seconds 12.6 sec is age norm (Fall risk) PATIENT SURVEYS:  BERG Balance Test: 41/56 (Fall risk)  TODAY'S TREATMENT:                                                                                                                              SUBJECTIVE: Pt had a few episodes of feet "freezing" this week; some while serving on the tennis court.  No falls reported this week.    Therapeutic Exercise: Scifit Level 6 x 10 min for LE  strength/endurance  White physioball for trunk mobility:  D2 diagonal (floor to over opposite shoulder), 2x10 ea direction Trunk rotation wall tap R and L x10 ea Overhead (shoulder flexion/thoracic extension) x10  Nautilus: 30# narrow grip chop high to low 2x10 R and L 30# narrow grip chop low to high 2x10 R and L  Neuro Re-education:  Alternating step up/reverse down: LRRL then RLLR-  for coordination; 3 rounds with PT verbal cues to "switch"  Ready stance (wide base/feet parallel) with PT directional verbal cue to step/lunge and reach with R UE (simulate tennis reach)    Not today: Standing rows: 3x12 black band with narrow base (feet next to each other) and with staggered stance  Brading/carioca: 2x ea direction Tandem forward walk/reverse walk 4 laps in // bars Step up onto bosu, balance, step  down reverse: lead with R/L LE when PT stated "switch" Lateral step up/on/over bosu 10x ea direction Forward/reverse walk on grass/uneven surface outside (PT SBA with gait belt) "Forward/back/right/left" directional changes with PT- focusing on reaction to external verbal cues Feet together on airex: 3 rounds in // bars, head turns R/L, up/down x20 ea Heel raises: 2x12 Sit to stand 2x5 Chair squats: 10# weight 2x8 Squat to pick up cone from floor (positioned in front/side) x 10 (demonstrates good control today) Split squat on soft blue mat: x10 with R leg in front, x10 with L LE in front Transfer standing to quadruped on blue mat- pt able to perform without requiring UE support, able to transfer to standing with R LE in front from tall kneeling Transfer standing to sidelying (via quadruped), rolling R/rolling L, transfer to quadruped and then standing: 4 reps SLS: 3+ trials ea LE (best time ~15 seconds) Standing on bosu: 3x without UE support, focusing on trunk neuromuscular control (best trial 10-15 seconds) Braiding/Carioca: R direction and L direction; backwards only/forward  only/backwards and then forwards complex pattern" holding 1 parallel bar Hamstring curl: 2x15 5# ankle weights Heel raises: 2x15 5# ankle weights Standing hip abd: with emphasis on upright trunk posture 2x10 R and L 5# ankle weights In parallel bars: Tandem walk on airex 4 laps Lateral walk on airex 4 laps Standing on airex feet together, upright trunk: 30 seconds x 3 Airex feet together with arms reach up/down x 10 ea Airex feet together with thoracic/lumbar/pelvic rotation reaching for ball and passing off to PT R and L x10 ea Airex feet together diagonal reach/pass from low to high R and L with PT x10 ea Alternating foot placement coordination on 6 inch step (b/l hands resting on railing) 1 min interval x 2, repeated standing on airex 1 min x 2 Obstacle course: step over hurdles, onto/over airex, onto/over step: focusing on visual scan of obstacles but not looking down the whole time during activity: 4 laps in hallway Blue "ladder"- High knees (foot in each square) 4x forward Lateral high knees (foot in each square) R and L 4x ea Diagonal pattern: "R in, L in, R out, L out, then L in, L out, L out, L in" in forward direction- 4 laps  HEP instruction/pt education (see below)  PATIENT EDUCATION: Education details: PT POC, HEP instruction/progression  Person educated: Patient Education method: Explanation Education comprehension: verbalized understanding  HOME EXERCISE PROGRAM: Access Code: [email or web address] URL: [email or web address] Date: 10/14/2022 Prepared by: Max Fickleachel Gordon Carlson  Exercises - Goblet Squat with Kettlebell  - 1 x daily - 3 x weekly - 2 sets - 20 reps - Standing Heel Raise with Chair Support  - 1 x daily - 3 x weekly - 2 sets - 20 reps - Standing Hamstring Curl with Chair Support  - 1 x daily - 3 x weekly - 2 sets - 20 reps - Seated Long Arc Quad with Ankle Weight  - 1 x daily - 3 x weekly - 2 sets - 20 reps - Standing Hip Abduction with Counter Support  - 1 x  daily - 3 x weekly - 2 sets - 20 reps - Standing Hip Adduction with Counter Support  - 1 x daily - 3 x weekly - 2 sets - 20 reps - Step Taps on High Step  - 1 x daily - 3 x weekly - 3 sets - 10 reps - Standing with Feet Together on Foam Pad (BKA)  - 1 x daily -  3 x weekly - 30 sec hold - Carioca with Counter Support  - 1 x daily - 3 reps - Step Up  - 1 x daily - 3 reps - Split Squats Forward Trunk  - 1 x daily - 2 sets - 10 reps  GOALS: Goals reviewed with patient? Yes  SHORT TERM GOALS: Target date: 10/17/22   Pt will be able to perform a HEP for balance/strengthening exercises independently Baseline: will establish at visit 2 Goal status: INITIAL  2.  Pt will be able to ascend/descend 1 flight of stairs with reciprocal gait pattern 2x without loss of balance to promote independently going upstairs to his bedroom  Baseline: will assess at visit 2 Goal status: INITIAL    LONG TERM GOALS: Target date: 11/13/22  Improve BERG to >46 indicating reduced risk for falls Baseline: 41 Goal status: INITIAL  2.  Improve 5x STS to <12 seconds indicating reduced risk for fall Baseline: 16 sec Goal status: INITIAL  3.  Improve SLS to >18 sec (age norm average) R and L on even and uneven surfaces to promote independent amb on flat and uneven outdoor surfaces without AD Baseline: 9-15 sec today R/L Goal status: INITIAL    ASSESSMENT:  CLINICAL IMPRESSION: Pt SBA during balance exercises today, no major episodes of loss of balance during today's session.  Focused interventions on mobility and strength via large movements that incorporate shoulder/trunk/pelvic/hip rotation D1 and D2 patterns today.  These activities are helpful for promoting overall trunk mobility/posture, and arm swing, and are also very functional for his tennis playing. Overall he tolerated tx well and should continue to benefit from skilled PT interventions to reduce fall risk and improve safety while amb on and navigating  uneven surfaces and quick direction changes requiring quick reactions.  OBJECTIVE IMPAIRMENTS: Abnormal gait, decreased balance, decreased cognition, decreased strength, and decreased safety awareness.   ACTIVITY LIMITATIONS: standing, squatting, stairs, and locomotion level  PARTICIPATION LIMITATIONS: driving, community activity, and yard work  PERSONAL FACTORS: Age and 1-2 comorbidities: anxiety, lewy body dementia  are also affecting patient's functional outcome.   REHAB POTENTIAL: Good  CLINICAL DECISION MAKING: Stable/uncomplicated  EVALUATION COMPLEXITY: Low  PLAN:  PT FREQUENCY: 1-2x/week  PT DURATION: 8 weeks  PLANNED INTERVENTIONS: Therapeutic exercises, Therapeutic activity, Neuromuscular re-education, Balance training, Gait training, Patient/Family education, Self Care, and Joint mobilization  PLAN FOR NEXT SESSION: Continue LE strengthening; add balance and gait retraining activities; dual tasking activities; focus on higher level balance activities   Max Fickle, PT, DPT, OCS  #[id number]  Ardine Bjork, PT 11/04/2022, 1:39 PM

## 2022-11-11 ENCOUNTER — Ambulatory Visit: Payer: Medicare Other

## 2022-11-18 ENCOUNTER — Ambulatory Visit: Payer: Medicare Other

## 2022-11-18 DIAGNOSIS — R2681 Unsteadiness on feet: Secondary | ICD-10-CM

## 2022-11-18 DIAGNOSIS — M6281 Muscle weakness (generalized): Secondary | ICD-10-CM

## 2022-11-18 DIAGNOSIS — R2689 Other abnormalities of gait and mobility: Secondary | ICD-10-CM

## 2022-11-18 NOTE — Therapy (Signed)
OUTPATIENT PHYSICAL THERAPY NEURO TREATMENT   Patient Name: Cory Hoover MRN: [id number] DOB:10-21-1950, 72 y.o., male Today's Date: 11/18/2022   PCP: Dr. Burnadette Pop, MD REFERRING PROVIDER: Dr. Sherryll Burger, MD  END OF SESSION:  PT End of Session - 11/18/22 1519     Visit Number 9    Number of Visits 17    Date for PT Re-Evaluation 11/13/22    Authorization Type Medicare    Authorization - Number of Visits 10    Progress Note Due on Visit 10    Equipment Utilized During Treatment Gait belt    Activity Tolerance Patient tolerated treatment well    Behavior During Therapy [facility] for tasks assessed/performed             Past Medical History:  Diagnosis Date   Anxiety    Aortic atherosclerosis (HCC)    BPH (benign prostatic hyperplasia)    Coronary artery disease    Current use of long term anticoagulation    Clopidogrel   Diverticulosis    ED (erectile dysfunction)    GERD (gastroesophageal reflux disease)    History of hiatal hernia    History of impacted cerumen    HLD (hyperlipidemia)    HTN (hypertension)    PVD (peripheral vascular disease) (HCC)    Past Surgical History:  Procedure Laterality Date   COLONOSCOPY WITH PROPOFOL N/A 09/10/2022   Procedure: COLONOSCOPY WITH PROPOFOL;  Surgeon: [name], MD;  Location: [facility] ENDOSCOPY;  Service: Endoscopy;  Laterality: N/A;   ESOPHAGEAL MANOMETRY N/A 12/27/2020   Procedure: ESOPHAGEAL MANOMETRY (EM);  Surgeon: Napoleon Form, MD;  Location: [facility] ENDOSCOPY;  Service: Endoscopy;  Laterality: N/A;   ESOPHAGOGASTRODUODENOSCOPY (EGD) WITH PROPOFOL N/A 01/18/2021   Procedure: ESOPHAGOGASTRODUODENOSCOPY (EGD) WITH PROPOFOL;  Surgeon: Midge Minium, MD;  Location: [facility] ENDOSCOPY;  Service: Endoscopy;  Laterality: N/A;   HEEL SPUR SURGERY Left    PH IMPEDANCE STUDY N/A 12/27/2020   Procedure: PH IMPEDANCE STUDY;  Surgeon: Napoleon Form, MD;  Location: [facility] ENDOSCOPY;  Service: Endoscopy;  Laterality: N/A;   VASECTOMY      XI ROBOTIC ASSISTED HIATAL HERNIA REPAIR N/A 03/01/2021   Procedure: XI ROBOTIC ASSISTED HIATAL HERNIA REPAIR WITH MESH RNFA to assist;  Surgeon: Leafy Ro, MD;  Location: [facility] ORS;  Service: General;  Laterality: N/A;   Patient Active Problem List   Diagnosis Date Noted   Generalized weakness 03/03/2021   Hypermagnesemia 03/03/2021   Hallucinations 03/03/2021   Prerenal azotemia 03/03/2021   Transaminitis 03/03/2021   HLD (hyperlipidemia)    HTN (hypertension)    Frequent falls 03/02/2021   S/P repair of paraesophageal hernia 03/01/2021   Cough    Heartburn    Gastritis without bleeding    Gastroesophageal reflux disease 12/06/2020   History of TIA (transient ischemic attack) 06/19/2020   Acute focal neurological deficit 06/10/2020   BPH (benign prostatic hyperplasia) 06/10/2020   Abnormal ECG 12/08/2019   Benign prostatic hyperplasia with nocturia 10/19/2019   Coronary artery disease 10/19/2019   Gastroesophageal reflux disease with esophagitis 10/19/2019   Encounter for general adult medical examination without abnormal findings 04/10/2017   History of normocytic normochromic anemia 04/10/2017   Vaccine counseling 03/07/2016   Pure hypercholesterolemia 05/22/2015   Bradycardia 09/22/2014    ONSET DATE: within the last month  REFERRING DIAG: G31.83 (Lewy body dementia), balance/gait abnormalities  THERAPY DIAG:  Unsteadiness on feet  Other abnormalities of gait and mobility  Muscle weakness (generalized)  Rationale for Evaluation and Treatment:  Rehabilitation  From initial evaluation note: SUBJECTIVE:                                                                                                                                                                                             SUBJECTIVE STATEMENT: Pt states 3 weeks ago he was in the ER because he had trouble walking. He saw Dr. Sherryll Burger in the past 2 weeks and was diagnosed with Lewy Body Dementia.  He  reports having difficulty with his balance for a few months.  He gradually has noticed that he has difficulty with foot placement while walking on uneven surfaces (trails); he has fallen twice in the past 3 months- while walking with friends on a trail and while playing recreational tennis on the tennis court.  Overall he feels like his L side is not as strong and coordinated as his right.  He also notes difficult with cognition, for example if he has to make a decision between 1 thing or another it takes him awhile to make a choice.  He is having some slight difficulty with word recall.  Also he expresses an increase in anxiety since receiving this diagnosis and has started medication to address this.  He does express fear and apprehension with his health.  His goal is to be able to maintain his strength, mobility, and independence as long as possible as the disease progresses.    PERTINENT HISTORY: Pt is retired since 2002 (used to work for [employer]).  Lives at home with his wife.  Has a multistory home which he plans to stay in forever; he and his wife had it built in the 41s.  He is very physically active- playing tennis 2x/wk, walking 1x/week, and doing machines  (bike, row,TM) at the gym 1-2x/week.  He regularly socializes with a group of friends that meet for lunch weekly.    PAIN:  Are you having pain? No  PRECAUTIONS: Fall  WEIGHT BEARING RESTRICTIONS: No  FALLS: Has patient fallen in last 6 months? Yes. Number of falls 2  LIVING ENVIRONMENT: Lives with: lives with their spouse Lives in: House/apartment Stairs: Yes: Internal: 1 flight steps; yes Has following equipment at home: None Pt states he is going to be having an elevator installed bc his bedroom is upstairs and media room is upstairs PLOF: Independent  PATIENT GOALS: to maintain his physical independence, mobility, and strength as long as possible  OBJECTIVE:    COGNITION: Overall cognitive status: Within functional limits  for tasks assessed   SENSATION: In tact to light touch b/l LE  COORDINATION: Impaired with finger to nose and heel  on shin movements L>R  POSTURE: rounded shoulders  LOWER EXTREMITY ROM:     Active  Right Eval Left Eval  Hip flexion    Hip extension    Hip abduction    Hip adduction    Hip internal rotation    Hip external rotation    Knee flexion    Knee extension    Ankle dorsiflexion    Ankle plantarflexion    Ankle inversion    Ankle eversion     (Blank rows = not tested)  LOWER EXTREMITY MMT:    MMT Right Eval Left Eval  Hip flexion 5 4  Hip extension    Hip abduction    Hip adduction    Hip internal rotation    Hip external rotation    Knee flexion 5 4  Knee extension 5 4  Ankle dorsiflexion 5 4  Ankle plantarflexion    Ankle inversion    Ankle eversion    (Blank rows = not tested)  BED MOBILITY:  Not assessed today  TRANSFERS: Assistive device utilized: None  Sit to stand: Complete Independence Stand to sit: Complete Independence Chair to chair: Complete Independence Floor:  not assessed today, deferred   GAIT: Gait pattern: decreased trunk rotation, trunk flexed, and wide BOS Distance walked: into/out of clinic independently Assistive device utilized: None Level of assistance: Complete Independence Comments: pt requires additional steps while turning 360 deg  FUNCTIONAL TESTS:  5 times sit to stand: 16 seconds 12.6 sec is age norm (Fall risk) PATIENT SURVEYS:  BERG Balance Test: 41/56 (Fall risk)  TODAY'S TREATMENT:                                                                                                                              SUBJECTIVE: Pt reports no falls since last PT session.  Having some difficulty with foot freezing while going down stairs in morning; it gets better as the day progresses.  He put tape makers on steps to help make it easier to differentiate where to step and it feels less like an optical illusion of  all steps blending together.  Played tennis- one good day, and one day where he had difficulty serving.  He listened to an opera before coming to PT today.  Therapeutic Exercise: Scifit Level 6 x 10 min for LE strength/endurance  White physioball for trunk mobility:  D2 diagonal (wall to over opposite shoulder), 2x10 ea direction Trunk rotation wall tap R and L x10 ea Overhead (shoulder flexion/thoracic extension) x10  Nautilus:- not today 30# narrow grip chop high to low 2x10 R and L 30# narrow grip chop low to high 2x10 R and L  Neuro Re-education: PT SBA with gait belt for all  Carioca R/L: 4x ea direction  Fast change of direction with reaction to external stimuli- PT verbal cue: Right, Left, Forward, Reverse  Hurdle drills: high/low hurdles- forward/lateral directions, PT switching the order of hurdles and then  quickly repeating task to emphasize practice with reaction to external stimuli in lateral directions  STAR balance: CW and CCW- toe taps  Discussed re-assessing goals and transitioning to HEP for a period of time at next session  Not today: Standing rows: 3x12 black band with narrow base (feet next to each other) and with staggered stance  Tandem forward walk/reverse walk 4 laps in // bars Step up onto bosu, balance, step down reverse: lead with R/L LE when PT stated "switch" Lateral step up/on/over bosu 10x ea direction Forward/reverse walk on grass/uneven surface outside (PT SBA with gait belt) "Forward/back/right/left" directional changes with PT- focusing on reaction to external verbal cues Feet together on airex: 3 rounds in // bars, head turns R/L, up/down x20 ea Heel raises: 2x12 Sit to stand 2x5 Chair squats: 10# weight 2x8 Squat to pick up cone from floor (positioned in front/side) x 10 (demonstrates good control today) Split squat on soft blue mat: x10 with R leg in front, x10 with L LE in front Transfer standing to quadruped on blue mat- pt able to  perform without requiring UE support, able to transfer to standing with R LE in front from tall kneeling Transfer standing to sidelying (via quadruped), rolling R/rolling L, transfer to quadruped and then standing: 4 reps SLS: 3+ trials ea LE (best time ~15 seconds) Standing on bosu: 3x without UE support, focusing on trunk neuromuscular control (best trial 10-15 seconds) Braiding/Carioca: R direction and L direction; backwards only/forward only/backwards and then forwards complex pattern" holding 1 parallel bar Hamstring curl: 2x15 5# ankle weights Heel raises: 2x15 5# ankle weights Standing hip abd: with emphasis on upright trunk posture 2x10 R and L 5# ankle weights In parallel bars: Tandem walk on airex 4 laps Lateral walk on airex 4 laps Standing on airex feet together, upright trunk: 30 seconds x 3 Airex feet together with arms reach up/down x 10 ea Airex feet together with thoracic/lumbar/pelvic rotation reaching for ball and passing off to PT R and L x10 ea Airex feet together diagonal reach/pass from low to high R and L with PT x10 ea Alternating foot placement coordination on 6 inch step (b/l hands resting on railing) 1 min interval x 2, repeated standing on airex 1 min x 2 Obstacle course: step over hurdles, onto/over airex, onto/over step: focusing on visual scan of obstacles but not looking down the whole time during activity: 4 laps in hallway Blue "ladder"- High knees (foot in each square) 4x forward Lateral high knees (foot in each square) R and L 4x ea Diagonal pattern: "R in, L in, R out, L out, then L in, L out, L out, L in" in forward direction- 4 laps  HEP instruction/pt education (see below)  PATIENT EDUCATION: Education details: PT POC, HEP instruction/progression  Person educated: Patient Education method: Explanation Education comprehension: verbalized understanding  HOME EXERCISE PROGRAM: Access Code: [email or web address] URL: [email or web address] Date:  10/14/2022 Prepared by: Max Fickle  Exercises - Goblet Squat with Kettlebell  - 1 x daily - 3 x weekly - 2 sets - 20 reps - Standing Heel Raise with Chair Support  - 1 x daily - 3 x weekly - 2 sets - 20 reps - Standing Hamstring Curl with Chair Support  - 1 x daily - 3 x weekly - 2 sets - 20 reps - Seated Long Arc Quad with Ankle Weight  - 1 x daily - 3 x weekly - 2 sets - 20 reps - Standing Hip Abduction with Counter  Support  - 1 x daily - 3 x weekly - 2 sets - 20 reps - Standing Hip Adduction with Counter Support  - 1 x daily - 3 x weekly - 2 sets - 20 reps - Step Taps on High Step  - 1 x daily - 3 x weekly - 3 sets - 10 reps - Standing with Feet Together on Foam Pad (BKA)  - 1 x daily - 3 x weekly - 30 sec hold - Carioca with Counter Support  - 1 x daily - 3 reps - Step Up  - 1 x daily - 3 reps - Split Squats Forward Trunk  - 1 x daily - 2 sets - 10 reps  GOALS: Goals reviewed with patient? Yes  SHORT TERM GOALS: Target date: 10/17/22   Pt will be able to perform a HEP for balance/strengthening exercises independently Baseline: will establish at visit 2 Goal status: INITIAL  2.  Pt will be able to ascend/descend 1 flight of stairs with reciprocal gait pattern 2x without loss of balance to promote independently going upstairs to his bedroom  Baseline: will assess at visit 2 Goal status: INITIAL    LONG TERM GOALS: Target date: 11/13/22  Improve BERG to >46 indicating reduced risk for falls Baseline: 41 Goal status: INITIAL  2.  Improve 5x STS to <12 seconds indicating reduced risk for fall Baseline: 16 sec Goal status: INITIAL  3.  Improve SLS to >18 sec (age norm average) R and L on even and uneven surfaces to promote independent amb on flat and uneven outdoor surfaces without AD Baseline: 9-15 sec today R/L Goal status: INITIAL    ASSESSMENT:  CLINICAL IMPRESSION: Focused on activities that promote practice with fast reaction/action to external cue or stimuli  today.  Pt was challenged, had no major loss of balance, and a few episodes of foot freezing during carioca but was able to pause and resume.  Foot placement was not always accurate during neuro-red, but no falls.  Overall he tolerated tx well; he expressed an increase in his confidence since starting PT.  He should continue to benefit from skilled PT interventions to reduce fall risk and improve safety while amb on and navigating uneven surfaces and quick direction changes requiring quick reactions.  OBJECTIVE IMPAIRMENTS: Abnormal gait, decreased balance, decreased cognition, decreased strength, and decreased safety awareness.   ACTIVITY LIMITATIONS: standing, squatting, stairs, and locomotion level  PARTICIPATION LIMITATIONS: driving, community activity, and yard work  PERSONAL FACTORS: Age and 1-2 comorbidities: anxiety, lewy body dementia  are also affecting patient's functional outcome.   REHAB POTENTIAL: Good  CLINICAL DECISION MAKING: Stable/uncomplicated  EVALUATION COMPLEXITY: Low  PLAN:  PT FREQUENCY: 1-2x/week  PT DURATION: 8 weeks  PLANNED INTERVENTIONS: Therapeutic exercises, Therapeutic activity, Neuromuscular re-education, Balance training, Gait training, Patient/Family education, Self Care, and Joint mobilization  PLAN FOR NEXT SESSION: Continue LE strengthening; add balance and gait retraining activities; dual tasking activities; focus on higher level balance activities; plan to reassess progress towards goals at next session and discuss pt's interest in transitioning to HEP for now.   Max Fickle, PT, DPT, OCS  #[id number]  Ardine Bjork, PT 11/18/2022, 5:58 PM

## 2022-11-25 ENCOUNTER — Ambulatory Visit: Payer: Medicare Other

## 2022-11-25 DIAGNOSIS — R2689 Other abnormalities of gait and mobility: Secondary | ICD-10-CM

## 2022-11-25 DIAGNOSIS — R2681 Unsteadiness on feet: Secondary | ICD-10-CM | POA: Diagnosis not present

## 2022-11-25 DIAGNOSIS — M6281 Muscle weakness (generalized): Secondary | ICD-10-CM

## 2022-11-25 NOTE — Therapy (Signed)
OUTPATIENT PHYSICAL THERAPY NEURO TREATMENT/PROGRESS NOTE   Patient Name: Cory Hoover MRN: [id number] DOB:1950-10-21, 72 y.o., male Today's Date: 11/25/2022   PCP: Dr. Burnadette Pop, MD REFERRING PROVIDER: Dr. Sherryll Burger, MD  END OF SESSION:  PT End of Session - 11/25/22 1657     Visit Number 10    Number of Visits 17    Date for PT Re-Evaluation 11/13/22    Authorization Type Medicare    Authorization - Number of Visits 10    Progress Note Due on Visit 10    PT Start Time 1505    PT Stop Time 1545    PT Time Calculation (min) 40 min    Equipment Utilized During Treatment Gait belt    Activity Tolerance Patient tolerated treatment well    Behavior During Therapy WFL for tasks assessed/performed             Past Medical History:  Diagnosis Date   Anxiety    Aortic atherosclerosis (HCC)    BPH (benign prostatic hyperplasia)    Coronary artery disease    Current use of long term anticoagulation    Clopidogrel   Diverticulosis    ED (erectile dysfunction)    GERD (gastroesophageal reflux disease)    History of hiatal hernia    History of impacted cerumen    HLD (hyperlipidemia)    HTN (hypertension)    PVD (peripheral vascular disease) (HCC)    Past Surgical History:  Procedure Laterality Date   COLONOSCOPY WITH PROPOFOL N/A 09/10/2022   Procedure: COLONOSCOPY WITH PROPOFOL;  Surgeon: [name], MD;  Location: [facility] ENDOSCOPY;  Service: Endoscopy;  Laterality: N/A;   ESOPHAGEAL MANOMETRY N/A 12/27/2020   Procedure: ESOPHAGEAL MANOMETRY (EM);  Surgeon: Napoleon Form, MD;  Location: [facility] ENDOSCOPY;  Service: Endoscopy;  Laterality: N/A;   ESOPHAGOGASTRODUODENOSCOPY (EGD) WITH PROPOFOL N/A 01/18/2021   Procedure: ESOPHAGOGASTRODUODENOSCOPY (EGD) WITH PROPOFOL;  Surgeon: Midge Minium, MD;  Location: [facility] ENDOSCOPY;  Service: Endoscopy;  Laterality: N/A;   HEEL SPUR SURGERY Left    PH IMPEDANCE STUDY N/A 12/27/2020   Procedure: PH IMPEDANCE STUDY;  Surgeon:  Napoleon Form, MD;  Location: [facility] ENDOSCOPY;  Service: Endoscopy;  Laterality: N/A;   VASECTOMY     XI ROBOTIC ASSISTED HIATAL HERNIA REPAIR N/A 03/01/2021   Procedure: XI ROBOTIC ASSISTED HIATAL HERNIA REPAIR WITH MESH RNFA to assist;  Surgeon: Leafy Ro, MD;  Location: [facility] ORS;  Service: General;  Laterality: N/A;   Patient Active Problem List   Diagnosis Date Noted   Generalized weakness 03/03/2021   Hypermagnesemia 03/03/2021   Hallucinations 03/03/2021   Prerenal azotemia 03/03/2021   Transaminitis 03/03/2021   HLD (hyperlipidemia)    HTN (hypertension)    Frequent falls 03/02/2021   S/P repair of paraesophageal hernia 03/01/2021   Cough    Heartburn    Gastritis without bleeding    Gastroesophageal reflux disease 12/06/2020   History of TIA (transient ischemic attack) 06/19/2020   Acute focal neurological deficit 06/10/2020   BPH (benign prostatic hyperplasia) 06/10/2020   Abnormal ECG 12/08/2019   Benign prostatic hyperplasia with nocturia 10/19/2019   Coronary artery disease 10/19/2019   Gastroesophageal reflux disease with esophagitis 10/19/2019   Encounter for general adult medical examination without abnormal findings 04/10/2017   History of normocytic normochromic anemia 04/10/2017   Vaccine counseling 03/07/2016   Pure hypercholesterolemia 05/22/2015   Bradycardia 09/22/2014    ONSET DATE: within the last month  REFERRING DIAG: G31.83 (Lewy body dementia), balance/gait abnormalities  THERAPY DIAG:  Unsteadiness on feet  Other abnormalities of gait and mobility  Muscle weakness (generalized)  Rationale for Evaluation and Treatment: Rehabilitation  From initial evaluation note: SUBJECTIVE:                                                                                                                                                                                             SUBJECTIVE STATEMENT: Pt states 3 weeks ago he was in the ER because he  had trouble walking. He saw Dr. Sherryll Burger in the past 2 weeks and was diagnosed with Lewy Body Dementia.  He reports having difficulty with his balance for a few months.  He gradually has noticed that he has difficulty with foot placement while walking on uneven surfaces (trails); he has fallen twice in the past 3 months- while walking with friends on a trail and while playing recreational tennis on the tennis court.  Overall he feels like his L side is not as strong and coordinated as his right.  He also notes difficult with cognition, for example if he has to make a decision between 1 thing or another it takes him awhile to make a choice.  He is having some slight difficulty with word recall.  Also he expresses an increase in anxiety since receiving this diagnosis and has started medication to address this.  He does express fear and apprehension with his health.  His goal is to be able to maintain his strength, mobility, and independence as long as possible as the disease progresses.    PERTINENT HISTORY: Pt is retired since 2002 (used to work for [employer]).  Lives at home with his wife.  Has a multistory home which he plans to stay in forever; he and his wife had it built in the 11s.  He is very physically active- playing tennis 2x/wk, walking 1x/week, and doing machines  (bike, row,TM) at the gym 1-2x/week.  He regularly socializes with a group of friends that meet for lunch weekly.    PAIN:  Are you having pain? No  PRECAUTIONS: Fall  WEIGHT BEARING RESTRICTIONS: No  FALLS: Has patient fallen in last 6 months? Yes. Number of falls 2  LIVING ENVIRONMENT: Lives with: lives with their spouse Lives in: House/apartment Stairs: Yes: Internal: 1 flight steps; yes Has following equipment at home: None Pt states he is going to be having an elevator installed bc his bedroom is upstairs and media room is upstairs PLOF: Independent  PATIENT GOALS: to maintain his physical independence, mobility, and strength  as long as possible  OBJECTIVE:    COGNITION: Overall cognitive status: Within functional  limits for tasks assessed   SENSATION: In tact to light touch b/l LE  COORDINATION: Impaired with finger to nose and heel on shin movements L>R  POSTURE: rounded shoulders  LOWER EXTREMITY ROM:     Active  Right Eval Left Eval  Hip flexion    Hip extension    Hip abduction    Hip adduction    Hip internal rotation    Hip external rotation    Knee flexion    Knee extension    Ankle dorsiflexion    Ankle plantarflexion    Ankle inversion    Ankle eversion     (Blank rows = not tested)  LOWER EXTREMITY MMT:    MMT Right Eval Left Eval  Hip flexion 5 4  Hip extension    Hip abduction    Hip adduction    Hip internal rotation    Hip external rotation    Knee flexion 5 4  Knee extension 5 4  Ankle dorsiflexion 5 4  Ankle plantarflexion    Ankle inversion    Ankle eversion    (Blank rows = not tested)  BED MOBILITY:  Not assessed today  TRANSFERS: Assistive device utilized: None  Sit to stand: Complete Independence Stand to sit: Complete Independence Chair to chair: Complete Independence Floor:  not assessed today, deferred   GAIT: Gait pattern: decreased trunk rotation, trunk flexed, and wide BOS Distance walked: into/out of clinic independently Assistive device utilized: None Level of assistance: Complete Independence Comments: pt requires additional steps while turning 360 deg  FUNCTIONAL TESTS:  5 times sit to stand: 16 seconds 12.6 sec is age norm (Fall risk) PATIENT SURVEYS:  BERG Balance Test: 41/56 (Fall risk)  TODAY'S TREATMENT:                                                                                                                              SUBJECTIVE: Pt reports no falls since last week. Overall, he notices he has to concentrate more when having to choose between initiating a few tasks simultaneously.  He would like to transition to  working on his HEP independently for a season.  He feels comfortable with his strength and balance exercises at home.  He plans to continue playing tennis which he thoroughly enjoys.  Therapeutic Exercise: Scifit Level 6 x 10 min for LE strength/endurance  White physioball for trunk mobility:  D2 diagonal (wall to over opposite shoulder), 2x10 ea direction Trunk rotation wall tap R and L x10 ea Overhead (shoulder flexion/thoracic extension) x10   Neuro Re-education: PT SBA with gait belt for all  Hurdle drills: high/low hurdles- forward/lateral directions, PT switching the order of hurdles and then quickly repeating task to emphasize practice with reaction to external stimuli in lateral directions  STAR balance: CW and CCW- toe taps to colored cones, verbal cues for which foot/color cone to work on quick reaction time  Assessed stairs: pt able to ascend/descend 12  stairs in clinic with unilateral railing and appropriate foot placement today.    5x STS: 11 seconds today (was 16 seconds at initial evaluation) SLS: R/L 30-40 seconds (multiple trials) BERG: 47  Re-assessed goals and transitioning to HEP for now  Not today: Standing rows: 3x12 black band with narrow base (feet next to each other) and with staggered stance  Tandem forward walk/reverse walk 4 laps in // bars Step up onto bosu, balance, step down reverse: lead with R/L LE when PT stated "switch" Lateral step up/on/over bosu 10x ea direction Forward/reverse walk on grass/uneven surface outside (PT SBA with gait belt) "Forward/back/right/left" directional changes with PT- focusing on reaction to external verbal cues Feet together on airex: 3 rounds in // bars, head turns R/L, up/down x20 ea Heel raises: 2x12 Sit to stand 2x5 Chair squats: 10# weight 2x8 Squat to pick up cone from floor (positioned in front/side) x 10 (demonstrates good control today) Split squat on soft blue mat: x10 with R leg in front, x10 with L LE in  front Transfer standing to quadruped on blue mat- pt able to perform without requiring UE support, able to transfer to standing with R LE in front from tall kneeling Transfer standing to sidelying (via quadruped), rolling R/rolling L, transfer to quadruped and then standing: 4 reps SLS: 3+ trials ea LE (best time ~15 seconds) Standing on bosu: 3x without UE support, focusing on trunk neuromuscular control (best trial 10-15 seconds) Braiding/Carioca: R direction and L direction; backwards only/forward only/backwards and then forwards complex pattern" holding 1 parallel bar Hamstring curl: 2x15 5# ankle weights Heel raises: 2x15 5# ankle weights Standing hip abd: with emphasis on upright trunk posture 2x10 R and L 5# ankle weights In parallel bars: Tandem walk on airex 4 laps Lateral walk on airex 4 laps Standing on airex feet together, upright trunk: 30 seconds x 3 Airex feet together with arms reach up/down x 10 ea Airex feet together with thoracic/lumbar/pelvic rotation reaching for ball and passing off to PT R and L x10 ea Airex feet together diagonal reach/pass from low to high R and L with PT x10 ea Alternating foot placement coordination on 6 inch step (b/l hands resting on railing) 1 min interval x 2, repeated standing on airex 1 min x 2 Obstacle course: step over hurdles, onto/over airex, onto/over step: focusing on visual scan of obstacles but not looking down the whole time during activity: 4 laps in hallway Blue "ladder"- High knees (foot in each square) 4x forward Lateral high knees (foot in each square) R and L 4x ea Diagonal pattern: "R in, L in, R out, L out, then L in, L out, L out, L in" in forward direction- 4 laps  HEP instruction/pt education (see below)  PATIENT EDUCATION: Education details: PT POC, HEP instruction/progression  Person educated: Patient Education method: Explanation Education comprehension: verbalized understanding  HOME EXERCISE PROGRAM: Access  Code: [email or web address] URL: [email or web address] Date: 10/14/2022 Prepared by: Max Fickle  Exercises - Goblet Squat with Kettlebell  - 1 x daily - 3 x weekly - 2 sets - 20 reps - Standing Heel Raise with Chair Support  - 1 x daily - 3 x weekly - 2 sets - 20 reps - Standing Hamstring Curl with Chair Support  - 1 x daily - 3 x weekly - 2 sets - 20 reps - Seated Long Arc Quad with Ankle Weight  - 1 x daily - 3 x weekly - 2 sets - 20 reps - Standing  Hip Abduction with Counter Support  - 1 x daily - 3 x weekly - 2 sets - 20 reps - Standing Hip Adduction with Counter Support  - 1 x daily - 3 x weekly - 2 sets - 20 reps - Step Taps on High Step  - 1 x daily - 3 x weekly - 3 sets - 10 reps - Standing with Feet Together on Foam Pad (BKA)  - 1 x daily - 3 x weekly - 30 sec hold - Carioca with Counter Support  - 1 x daily - 3 reps - Step Up  - 1 x daily - 3 reps - Split Squats Forward Trunk  - 1 x daily - 2 sets - 10 reps  GOALS: Goals reviewed with patient? Yes  SHORT TERM GOALS: Target date: 10/17/22   Pt will be able to perform a HEP for balance/strengthening exercises independently Baseline: will establish at visit 2; 4/29: has been established Goal status: MET  2.  Pt will be able to ascend/descend 1 flight of stairs with reciprocal gait pattern 2x without loss of balance to promote independently going upstairs to his bedroom  Baseline: will assess at visit 2; 4/29: met Goal status: MET   LONG TERM GOALS: Target date: 11/13/22  Improve BERG to >46 indicating reduced risk for falls Baseline: 41; 4/29: 47 Goal status: MET  2.  Improve 5x STS to <12 seconds indicating reduced risk for fall Baseline: 16 sec; 4/29: 11 seconds Goal status: MET  3.  Improve SLS to >18 sec (age norm average) R and L on even and uneven surfaces to promote independent amb on flat and uneven outdoor surfaces without AD Baseline: 9-15 sec today R/L; 4/29: 30-40 seconds R/L Goal status:  MET   ASSESSMENT:  CLINICAL IMPRESSION: Pt does continue to have difficulty with functional movements requiring quick foot placement, reaction speed, or direction changes.  Overall though he expresses he feels comfortable with continuing his strength and balance exercises independently for now and returning to PT if/when he functionally has more difficulty.  He should continue to benefit from skilled PT interventions to reduce fall risk and improve safety while amb on and navigating uneven surfaces and quick direction changes requiring quick reactions.  OBJECTIVE IMPAIRMENTS: Abnormal gait, decreased balance, decreased cognition, decreased strength, and decreased safety awareness.   ACTIVITY LIMITATIONS: standing, squatting, stairs, and locomotion level  PARTICIPATION LIMITATIONS: driving, community activity, and yard work  PERSONAL FACTORS: Age and 1-2 comorbidities: anxiety, lewy body dementia  are also affecting patient's functional outcome.   REHAB POTENTIAL: Good  CLINICAL DECISION MAKING: Stable/uncomplicated  EVALUATION COMPLEXITY: Low  PLAN:  PT FREQUENCY: 1-2x/week  PT DURATION: 8 weeks  PLANNED INTERVENTIONS: Therapeutic exercises, Therapeutic activity, Neuromuscular re-education, Balance training, Gait training, Patient/Family education, Self Care, and Joint mobilization  PLAN FOR NEXT SESSION: Pt will continue with an independent strengthening/balance program for now.  Advised to reach out and return in the future if he notices a decline in function or has additional questions or concerns.   Max Fickle, PT, DPT, OCS  #[id number]  Ardine Bjork, PT 11/25/2022, 5:16 PM

## 2023-01-13 ENCOUNTER — Other Ambulatory Visit
Admission: RE | Admit: 2023-01-13 | Discharge: 2023-01-13 | Disposition: A | Discharge status: Home / Self Care | Payer: Medicare Other | Source: Ambulatory Visit | Attending: Ophthalmology | Admitting: Ophthalmology

## 2023-01-13 DIAGNOSIS — H02403 Unspecified ptosis of bilateral eyelids: Secondary | ICD-10-CM | POA: Diagnosis not present

## 2023-01-13 DIAGNOSIS — Z7689 Persons encountering health services in other specified circumstances: Secondary | ICD-10-CM | POA: Diagnosis present

## 2023-01-14 LAB — ACETYLCHOLINE RECEPTOR, BINDING: Acety choline binding ab: <0.03 nmol/L (ref 0.00–0.24)

## 2023-01-20 ENCOUNTER — Encounter: Payer: Self-pay | Admitting: *Deleted

## 2023-01-21 ENCOUNTER — Ambulatory Visit
Admission: RE | Admit: 2023-01-21 | Discharge: 2023-01-21 | Disposition: A | Discharge status: Home / Self Care | Payer: Medicare Other | Attending: Gastroenterology | Admitting: Gastroenterology

## 2023-01-21 ENCOUNTER — Encounter: Payer: Self-pay | Admitting: *Deleted

## 2023-01-21 ENCOUNTER — Ambulatory Visit: Payer: Medicare Other | Admitting: Certified Registered"

## 2023-01-21 ENCOUNTER — Other Ambulatory Visit: Payer: Self-pay

## 2023-01-21 ENCOUNTER — Encounter
Admission: RE | Disposition: A | Discharge status: Home / Self Care | Payer: Self-pay | Source: Home / Self Care | Attending: Gastroenterology

## 2023-01-21 DIAGNOSIS — F419 Anxiety disorder, unspecified: Secondary | ICD-10-CM | POA: Diagnosis not present

## 2023-01-21 DIAGNOSIS — E785 Hyperlipidemia, unspecified: Secondary | ICD-10-CM | POA: Diagnosis not present

## 2023-01-21 DIAGNOSIS — N400 Enlarged prostate without lower urinary tract symptoms: Secondary | ICD-10-CM | POA: Diagnosis not present

## 2023-01-21 DIAGNOSIS — I10 Essential (primary) hypertension: Secondary | ICD-10-CM | POA: Insufficient documentation

## 2023-01-21 DIAGNOSIS — I2510 Atherosclerotic heart disease of native coronary artery without angina pectoris: Secondary | ICD-10-CM | POA: Diagnosis not present

## 2023-01-21 DIAGNOSIS — K2970 Gastritis, unspecified, without bleeding: Secondary | ICD-10-CM | POA: Diagnosis not present

## 2023-01-21 DIAGNOSIS — I739 Peripheral vascular disease, unspecified: Secondary | ICD-10-CM | POA: Insufficient documentation

## 2023-01-21 DIAGNOSIS — K219 Gastro-esophageal reflux disease without esophagitis: Secondary | ICD-10-CM | POA: Insufficient documentation

## 2023-01-21 DIAGNOSIS — Z7902 Long term (current) use of antithrombotics/antiplatelets: Secondary | ICD-10-CM | POA: Insufficient documentation

## 2023-01-21 DIAGNOSIS — Z79899 Other long term (current) drug therapy: Secondary | ICD-10-CM | POA: Diagnosis not present

## 2023-01-21 DIAGNOSIS — K31A19 Gastric intestinal metaplasia without dysplasia, unspecified site: Secondary | ICD-10-CM | POA: Insufficient documentation

## 2023-01-21 HISTORY — PX: BIOPSY: SHX5522

## 2023-01-21 HISTORY — PX: ESOPHAGOGASTRODUODENOSCOPY (EGD) WITH PROPOFOL: SHX5813

## 2023-01-21 SURGERY — ESOPHAGOGASTRODUODENOSCOPY (EGD) WITH PROPOFOL
Anesthesia: General

## 2023-01-21 MED ORDER — PROPOFOL 10 MG/ML IV BOLUS
INTRAVENOUS | Status: DC | PRN
  Administered 2023-01-21 (×3): 20 mg via INTRAVENOUS
  Administered 2023-01-21: 100 mg via INTRAVENOUS

## 2023-01-21 MED ORDER — LIDOCAINE HCL (CARDIAC) PF 100 MG/5ML IV SOSY
PREFILLED_SYRINGE | INTRAVENOUS | Status: DC | PRN
  Administered 2023-01-21: 100 mg via INTRAVENOUS

## 2023-01-21 MED ORDER — SODIUM CHLORIDE 0.9 % IV SOLN: INTRAVENOUS | Status: DC

## 2023-01-21 NOTE — H&P (Signed)
Outpatient short stay form Pre-procedure 01/21/2023  [name], MD  Primary Physician: Marisue Ivan, MD  Reason for visit:  GIM  History of present illness:    72 y/o gentleman with history of hypertension, anxiety, and HLD here for EGD due to history of intestinal metaplasia of the stomach. No blood thinners. No family history of GI malignancies. History of nissen fundoplication.    Current Facility-Administered Medications:    0.9 %  sodium chloride infusion, , Intravenous, Continuous, Lillis Nuttle, Rossie Muskrat, MD, Last Rate: 20 mL/hr at 01/21/23 0817, Continued from Pre-op at 01/21/23 0817  Medications Prior to Admission  Medication Sig Dispense Refill Last Dose   atorvastatin (LIPITOR) 40 MG tablet Hold until followup with your PCP, due to your elevated liver enzymes and worsening weakness.  1 01/20/2023   escitalopram (LEXAPRO) 5 MG tablet Take 5 mg by mouth daily.   01/20/2023   Multiple Vitamin (MULTIVITAMIN WITH MINERALS) TABS tablet Take 1 tablet by mouth every other day. In the evening   01/20/2023   Probiotic Product (PROBIOTIC PO) Take 1 capsule by mouth every evening.   01/20/2023   rivastigmine (EXELON) 1.5 MG capsule Take 1.5 mg by mouth 2 (two) times daily.   01/20/2023   tamsulosin (FLOMAX) 0.4 MG CAPS capsule Take 2 capsules (0.8 mg total) by mouth every evening.   01/20/2023   acetaminophen (TYLENOL) 500 MG tablet Take 1,000 mg by mouth every 8 (eight) hours as needed (pain).      amLODipine (NORVASC) 2.5 MG tablet Take 2.5 mg by mouth every evening. (Patient not taking: Reported on 09/10/2022)      clopidogrel (PLAVIX) 75 MG tablet Take 75 mg by mouth every evening. (Patient not taking: Reported on 09/10/2022)      ibuprofen (ADVIL) 200 MG tablet Take 400 mg by mouth every 8 (eight) hours as needed.      predniSONE (STERAPRED UNI-PAK 21 TAB) 10 MG (21) TBPK tablet Take as directed on packaging (Patient not taking: Reported on 09/10/2022) 21 each 0      Allergies   Allergen Reactions   Citalopram Other (See Comments)    Reaction: Sexual side effects     Past Medical History:  Diagnosis Date   Anxiety    Aortic atherosclerosis (HCC)    BPH (benign prostatic hyperplasia)    Coronary artery disease    Current use of long term anticoagulation    Clopidogrel   Diverticulosis    ED (erectile dysfunction)    GERD (gastroesophageal reflux disease)    History of hiatal hernia    History of impacted cerumen    HLD (hyperlipidemia)    HTN (hypertension)    PVD (peripheral vascular disease) (HCC)     Review of systems:  Otherwise negative.    Physical Exam  Gen: Alert, oriented. Appears stated age.  HEENT: PERRLA. Lungs: No respiratory distress CV: RRR Abd: soft, benign, no masses Ext: No edema    Planned procedures: Proceed with EGD. The patient understands the nature of the planned procedure, indications, risks, alternatives and potential complications including but not limited to bleeding, infection, perforation, damage to internal organs and possible oversedation/side effects from anesthesia. The patient agrees and gives consent to proceed.  Please refer to procedure notes for findings, recommendations and patient disposition/instructions.     [name], MD [facility]

## 2023-01-21 NOTE — Interval H&P Note (Signed)
History and Physical Interval Note:  01/21/2023 8:25 AM  Cory Hoover  has presented today for surgery, with the diagnosis of Gastric Intestinal metaplasia.  The various methods of treatment have been discussed with the patient and family. After consideration of risks, benefits and other options for treatment, the patient has consented to  Procedure(s): ESOPHAGOGASTRODUODENOSCOPY (EGD) WITH PROPOFOL (N/A) as a surgical intervention.  The patient's history has been reviewed, patient examined, no change in status, stable for surgery.  I have reviewed the patient's chart and labs.  Questions were answered to the patient's satisfaction.     [name]  Ok to proceed with EGD

## 2023-01-21 NOTE — Anesthesia Postprocedure Evaluation (Signed)
Anesthesia Post Note  Patient: Cory Hoover  Procedure(s) Performed: ESOPHAGOGASTRODUODENOSCOPY (EGD) WITH PROPOFOL  Patient location during evaluation: PACU Anesthesia Type: General Level of consciousness: awake and alert Pain management: pain level controlled Vital Signs Assessment: post-procedure vital signs reviewed and stable Respiratory status: spontaneous breathing, nonlabored ventilation, respiratory function stable and patient connected to nasal cannula oxygen Cardiovascular status: blood pressure returned to baseline and stable Postop Assessment: no apparent nausea or vomiting Anesthetic complications: no   No notable events documented.   Last Vitals:  Vitals:   01/21/23 0842 01/21/23 0852  BP: 128/80 (!) 114/99  Pulse: (!) 58   Resp: 19   Temp: (!) 36.4 C   SpO2: 97%     Last Pain:  Vitals:   01/21/23 0902  TempSrc:   PainSc: 0-No pain                 Yevette Edwards

## 2023-01-21 NOTE — Transfer of Care (Signed)
Immediate Anesthesia Transfer of Care Note  Patient: Cory Hoover  Procedure(s) Performed: ESOPHAGOGASTRODUODENOSCOPY (EGD) WITH PROPOFOL  Patient Location: PACU and Endoscopy Unit  Anesthesia Type:General  Level of Consciousness: drowsy and patient cooperative  Airway & Oxygen Therapy: Patient Spontanous Breathing  Post-op Assessment: Report given to RN and Post -op Vital signs reviewed and stable  Post vital signs: Reviewed and stable  Last Vitals:  Vitals Value Taken Time  BP 128/80 01/21/23 0842  Temp 36.4 C 01/21/23 0842  Pulse 58 01/21/23 0845  Resp 18 01/21/23 0845  SpO2 96 % 01/21/23 0845  Vitals shown include unvalidated device data.  Last Pain:  Vitals:   01/21/23 0842  TempSrc: Temporal  PainSc: Asleep         Complications: No notable events documented.

## 2023-01-21 NOTE — Op Note (Signed)
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 Estimated blood loss:                         Minimal. Procedure:             Pre-Anesthesia Assessment:                        - Prior to the procedure, a History and Physical was                         performed, and patient medications and allergies were                         reviewed. The patient is competent. The risks and                         benefits of the procedure and the sedation options and                         risks were discussed with the patient. All questions                         were answered and informed consent was obtained.                         Patient identification and proposed procedure were                         verified by the physician, the nurse, the                         anesthesiologist, the anesthetist and the technician                         in the endoscopy suite. Mental Status Examination:                         alert and oriented. Airway Examination: normal                         oropharyngeal airway and neck mobility. Respiratory                         Examination: clear to auscultation. CV Examination:  normal. Prophylactic Antibiotics: The patient does not                         require prophylactic antibiotics. Prior                         Anticoagulants: The patient has taken no  anticoagulant                         or antiplatelet agents except for aspirin. ASA Grade                         Assessment: II - A patient with mild systemic disease.                         After reviewing the risks and benefits, the patient                         was deemed in satisfactory condition to undergo the                         procedure. The anesthesia plan was to use monitored                         anesthesia care (MAC). Immediately prior to                         administration of medications, the patient was                         re-assessed for adequacy to receive sedatives. The                         heart rate, respiratory rate, oxygen saturations,                         blood pressure, adequacy of pulmonary ventilation, and                         response to care were monitored throughout the                         procedure. The physical status of the patient was                         re-assessed after the procedure.                        After obtaining informed consent, the endoscope was                         passed under direct vision. Throughout the procedure,                         the patient's blood pressure, pulse, and oxygen                         saturations were monitored continuously. The Endoscope  was introduced through the mouth, and advanced to the                         second part of duodenum. The upper GI endoscopy was                         accomplished without difficulty. The patient tolerated                         the procedure well. Findings:      The examined esophagus was normal.      Patchy moderate inflammation characterized by erythema was found in the       gastric body and in the gastric antrum. Six biopsies were obtained with       cold forceps for histology in the gastric antrum, as well as four       biopsies in the gastric body. Estimated blood loss was minimal.      The examined duodenum  was normal. Impression:            - Normal esophagus.                        - Gastritis.                        - Normal examined duodenum.                        - Biopsies performed in the gastric antrum and in the                         gastric body. Recommendation:        - Discharge patient to home.                        - Resume previous diet.                        - Continue present medications.                        - Await pathology results.                        - Return to referring physician as previously                         scheduled. Procedure Code(s):     --- Professional ---                        [phone], Esophagogastroduodenoscopy, flexible,                         transoral; with biopsy, single or multiple Diagnosis Code(s):     --- Professional ---                        K29.70, Gastritis, unspecified, without bleeding                        K31.A19, Gastric intestinal metaplasia without  dysplasia, unspecified site CPT copyright 2022 [employer]. All rights reserved. The codes documented in this report are preliminary and upon coder review may  be revised to meet current compliance requirements. Eather Colas MD, MD 01/21/2023 8:43:11 AM Number of Addenda: 0 Note Initiated On: 01/21/2023 8:18 AM Estimated Blood Loss:  Estimated blood loss was minimal.      [facility]

## 2023-01-21 NOTE — Anesthesia Preprocedure Evaluation (Signed)
Anesthesia Evaluation  Patient identified by MRN, date of birth, ID band Patient awake    Reviewed: Allergy & Precautions, H&P , NPO status , Patient's Chart, lab work & pertinent test results, reviewed documented beta blocker date and time   Airway Mallampati: II   Neck ROM: full    Dental  (+) Poor Dentition   Pulmonary neg pulmonary ROS   Pulmonary exam normal        Cardiovascular Exercise Tolerance: Good hypertension, On Medications + CAD and + Peripheral Vascular Disease  Normal cardiovascular exam Rhythm:regular Rate:Normal     Neuro/Psych   Anxiety     negative neurological ROS  negative psych ROS   GI/Hepatic Neg liver ROS, hiatal hernia,GERD  Medicated,,  Endo/Other  negative endocrine ROS    Renal/GU negative Renal ROS  negative genitourinary   Musculoskeletal   Abdominal   Peds  Hematology negative hematology ROS (+)   Anesthesia Other Findings Past Medical History: No date: Anxiety No date: Aortic atherosclerosis (HCC) No date: BPH (benign prostatic hyperplasia) No date: Coronary artery disease No date: Current use of long term anticoagulation     Comment:  Clopidogrel No date: Diverticulosis No date: ED (erectile dysfunction) No date: GERD (gastroesophageal reflux disease) No date: History of hiatal hernia No date: History of impacted cerumen No date: HLD (hyperlipidemia) No date: HTN (hypertension) No date: PVD (peripheral vascular disease) (HCC) Past Surgical History: 09/10/2022: COLONOSCOPY WITH PROPOFOL; N/A     Comment:  Procedure: COLONOSCOPY WITH PROPOFOL;  Surgeon:               [name], MD;  Location: [facility] ENDOSCOPY;                Service: Endoscopy;  Laterality: N/A; 12/27/2020: ESOPHAGEAL MANOMETRY; N/A     Comment:  Procedure: ESOPHAGEAL MANOMETRY (EM);  Surgeon:               Napoleon Form, MD;  Location: [facility] ENDOSCOPY;                Service: Endoscopy;   Laterality: N/A; 01/18/2021: ESOPHAGOGASTRODUODENOSCOPY (EGD) WITH PROPOFOL; N/A     Comment:  Procedure: ESOPHAGOGASTRODUODENOSCOPY (EGD) WITH               PROPOFOL;  Surgeon: Midge Minium, MD;  Location: [facility]               ENDOSCOPY;  Service: Endoscopy;  Laterality: N/A; No date: HEEL SPUR SURGERY; Left No date: HERNIA REPAIR 12/27/2020: PH IMPEDANCE STUDY; N/A     Comment:  Procedure: PH IMPEDANCE STUDY;  Surgeon: Napoleon Form, MD;  Location: [facility] ENDOSCOPY;  Service:               Endoscopy;  Laterality: N/A; No date: VASECTOMY 03/01/2021: XI ROBOTIC ASSISTED HIATAL HERNIA REPAIR; N/A     Comment:  Procedure: XI ROBOTIC ASSISTED HIATAL HERNIA REPAIR WITH              MESH RNFA to assist;  Surgeon: Leafy Ro, MD;                Location: [facility] ORS;  Service: General;  Laterality: N/A; BMI    Body Mass Index: 25.44 kg/m     Reproductive/Obstetrics negative OB ROS  Anesthesia Physical Anesthesia Plan  ASA: 3  Anesthesia Plan: General   Post-op Pain Management:    Induction:   PONV Risk Score and Plan:   Airway Management Planned:   Additional Equipment:   Intra-op Plan:   Post-operative Plan:   Informed Consent: I have reviewed the patients History and Physical, chart, labs and discussed the procedure including the risks, benefits and alternatives for the proposed anesthesia with the patient or authorized representative who has indicated his/her understanding and acceptance.     Dental Advisory Given  Plan Discussed with: CRNA  Anesthesia Plan Comments:        Anesthesia Quick Evaluation

## 2023-01-21 NOTE — Anesthesia Procedure Notes (Signed)
Procedure Name: MAC Date/Time: 01/21/2023 8:30 AM  Performed by: Cheral Bay, CRNAPre-anesthesia Checklist: Patient identified, Emergency Drugs available, Suction available, Patient being monitored and Timeout performed Patient Re-evaluated:Patient Re-evaluated prior to induction Oxygen Delivery Method: Nasal cannula Induction Type: IV induction Placement Confirmation: positive ETCO2 and CO2 detector

## 2023-01-22 ENCOUNTER — Encounter: Payer: Self-pay | Admitting: Gastroenterology
